# Patient Record
Sex: Male | Born: 1960 | Race: White | Hispanic: No | Smoking: Never smoker
Health system: Southern US, Community
[De-identification: ages and names within clinical notes are randomized; demographics above are authoritative.]

## PROBLEM LIST (undated history)

## (undated) DIAGNOSIS — I2510 Atherosclerotic heart disease of native coronary artery without angina pectoris: Secondary | ICD-10-CM

## (undated) DIAGNOSIS — L0390 Cellulitis, unspecified: Secondary | ICD-10-CM

## (undated) DIAGNOSIS — I82409 Acute embolism and thrombosis of unspecified deep veins of unspecified lower extremity: Secondary | ICD-10-CM

## (undated) DIAGNOSIS — I2089 Other forms of angina pectoris: Secondary | ICD-10-CM

## (undated) DIAGNOSIS — I219 Acute myocardial infarction, unspecified: Secondary | ICD-10-CM

## (undated) DIAGNOSIS — I208 Other forms of angina pectoris: Secondary | ICD-10-CM

## (undated) DIAGNOSIS — I10 Essential (primary) hypertension: Secondary | ICD-10-CM

## (undated) HISTORY — PX: CORONARY ANGIOPLASTY: SHX604

## (undated) HISTORY — PX: BACK SURGERY: SHX140

## (undated) HISTORY — DX: Essential (primary) hypertension: I10

## (undated) HISTORY — PX: HERNIA REPAIR: SHX51

---

## 2007-01-16 ENCOUNTER — Ambulatory Visit: Payer: Self-pay | Admitting: Nurse Practitioner

## 2007-01-17 ENCOUNTER — Ambulatory Visit: Payer: Self-pay | Admitting: Internal Medicine

## 2007-01-18 ENCOUNTER — Ambulatory Visit (HOSPITAL_COMMUNITY): Admission: RE | Admit: 2007-01-18 | Discharge: 2007-01-18 | Payer: Self-pay | Admitting: Family Medicine

## 2007-01-18 ENCOUNTER — Ambulatory Visit: Payer: Self-pay | Admitting: Vascular Surgery

## 2007-01-31 ENCOUNTER — Encounter (HOSPITAL_COMMUNITY): Admission: RE | Admit: 2007-01-31 | Discharge: 2007-02-01 | Payer: Self-pay | Admitting: Cardiology

## 2008-11-07 ENCOUNTER — Observation Stay (HOSPITAL_COMMUNITY): Admission: EM | Admit: 2008-11-07 | Discharge: 2008-11-09 | Payer: Self-pay | Admitting: Emergency Medicine

## 2008-11-28 ENCOUNTER — Ambulatory Visit: Payer: Self-pay | Admitting: Internal Medicine

## 2008-12-02 ENCOUNTER — Ambulatory Visit: Payer: Self-pay | Admitting: Internal Medicine

## 2008-12-09 ENCOUNTER — Ambulatory Visit (HOSPITAL_COMMUNITY): Admission: RE | Admit: 2008-12-09 | Discharge: 2008-12-09 | Payer: Self-pay | Admitting: Internal Medicine

## 2009-01-10 ENCOUNTER — Ambulatory Visit: Payer: Self-pay | Admitting: Internal Medicine

## 2009-04-09 ENCOUNTER — Encounter (INDEPENDENT_AMBULATORY_CARE_PROVIDER_SITE_OTHER): Payer: Self-pay | Admitting: Internal Medicine

## 2009-04-09 ENCOUNTER — Ambulatory Visit: Payer: Self-pay | Admitting: Internal Medicine

## 2009-04-09 LAB — CONVERTED CEMR LAB
BUN: 14 mg/dL
Basophils Absolute: 0 K/uL
Basophils Relative: 0 %
CO2: 21 meq/L
Calcium: 9.3 mg/dL
Chloride: 104 meq/L
Creatinine, Ser: 0.93 mg/dL
Eosinophils Absolute: 0.2 K/uL
Eosinophils Relative: 2 %
Glucose, Bld: 111 mg/dL — ABNORMAL HIGH
HCT: 47.1 %
Hemoglobin: 15.3 g/dL
Lymphocytes Relative: 20 %
Lymphs Abs: 2 K/uL
MCHC: 32.5 g/dL
MCV: 90.9 fL
Monocytes Absolute: 0.7 K/uL
Monocytes Relative: 7 %
Neutro Abs: 7.4 K/uL
Neutrophils Relative %: 72 %
Platelets: 255 K/uL
Potassium: 4.7 meq/L
Prolactin: 6.5 ng/mL
RBC: 5.18 M/uL
RDW: 13.8 %
Sodium: 139 meq/L
TSH: 2.013 u[IU]/mL
Testosterone: 168.95 ng/dL — ABNORMAL LOW
WBC: 10.3 10*3/microliter

## 2009-04-23 ENCOUNTER — Ambulatory Visit: Payer: Self-pay | Admitting: Internal Medicine

## 2009-04-23 ENCOUNTER — Encounter (INDEPENDENT_AMBULATORY_CARE_PROVIDER_SITE_OTHER): Payer: Self-pay | Admitting: Internal Medicine

## 2009-05-13 ENCOUNTER — Ambulatory Visit: Payer: Self-pay | Admitting: Internal Medicine

## 2010-11-04 LAB — CBC
Hemoglobin: 15.6 g/dL (ref 13.0–17.0)
MCHC: 34.1 g/dL (ref 30.0–36.0)
MCV: 88.8 fL (ref 78.0–100.0)
RBC: 5.16 MIL/uL (ref 4.22–5.81)

## 2010-11-04 LAB — LIPID PANEL
Cholesterol: 105 mg/dL (ref 0–200)
LDL Cholesterol: 59 mg/dL (ref 0–99)
Total CHOL/HDL Ratio: 4.2 RATIO

## 2010-11-04 LAB — URINALYSIS, ROUTINE W REFLEX MICROSCOPIC
Bilirubin Urine: NEGATIVE
Hgb urine dipstick: NEGATIVE
Specific Gravity, Urine: 1.017 (ref 1.005–1.030)
Urobilinogen, UA: 0.2 mg/dL (ref 0.0–1.0)
pH: 6.5 (ref 5.0–8.0)

## 2010-11-04 LAB — COMPREHENSIVE METABOLIC PANEL
ALT: 47 U/L (ref 0–53)
Alkaline Phosphatase: 52 U/L (ref 39–117)
CO2: 26 mEq/L (ref 19–32)
GFR calc non Af Amer: >60 mL/min (ref >60)
Glucose, Bld: 105 mg/dL — ABNORMAL HIGH (ref 70–99)
Potassium: 4.2 mEq/L (ref 3.5–5.1)
Sodium: 137 mEq/L (ref 135–145)

## 2010-11-04 LAB — HEMOGLOBIN A1C: Hgb A1c MFr Bld: 5.4 % (ref 4.6–6.1)

## 2010-11-04 LAB — TROPONIN I
Troponin I: <0.01 ng/mL (ref 0.00–0.06)
Troponin I: <0.01 ng/mL (ref 0.00–0.06)

## 2010-11-04 LAB — CK TOTAL AND CKMB (NOT AT ARMC): Total CK: 170 U/L (ref 7–232)

## 2010-11-04 LAB — DIFFERENTIAL
Basophils Relative: 0 % (ref 0–1)
Eosinophils Absolute: 0 10*3/uL (ref 0.0–0.7)
Monocytes Relative: 8 % (ref 3–12)
Neutrophils Relative %: 72 % (ref 43–77)

## 2010-11-04 LAB — PROTIME-INR: INR: 1 (ref 0.00–1.49)

## 2010-11-04 LAB — BRAIN NATRIURETIC PEPTIDE: Pro B Natriuretic peptide (BNP): 31 pg/mL (ref 0.0–100.0)

## 2010-12-08 NOTE — Discharge Summary (Signed)
NAMEPHILIP, Pena                ACCOUNT NO.:  [id number]   MEDICAL RECORD NO.:  [id number]          PATIENT TYPE:  OBV   LOCATION:  3707                         FACILITY:  [facility]   PHYSICIAN:  Antonieta Iba, MD   DATE OF BIRTH:  07/28/1960   DATE OF ADMISSION:  11/07/2008  DATE OF DISCHARGE:                               DISCHARGE SUMMARY   DISCHARGE DIAGNOSES:  1. Dyspnea on exertion, rule out anginal equivalent.  2. Neck pain, rule out anginal equivalent.  3. History of out-of-hospital arrest in [place] with cardiac      catheterization in the past but no knowledge of any stents and now      with nonobstructive coronary disease of 30% stenosis only in the      left anterior descending x2 areas, otherwise patent course and      normal ejection fraction.  4. Hypertension, poorly controlled.  5. Palpitations complained on admission.  6. Obesity.   DISCHARGE CONDITION:  Improved.   PROCEDURE:  Combined left heart cath by Dr. Ritta Slot on November 08, 2008.   DISCHARGE MEDICATIONS:  1. Metoprolol tartrate increased to 25 mg twice a day.  2. We discontinued his nitroglycerin as he had no spasm on cardiac      cath and nonobstructive coronary disease.  3. Vitamin C 1000 mg daily.  4. Folic acid 400 mg daily.  5. B12 1000 mg daily.  6. Aspirin 81 mg daily.  7. Glucosamine 2 daily.  8. ProAir HFA inhalation as needed.  9. We recommended fish oil 1 capsule twice a day to improve his HDL as      it was low.   DISCHARGE INSTRUCTIONS:  1. Low-sodium, heart-healthy diet.  2. Increase activity slowly.  May shower on November 09, 2008.  No      lifting for 2 days.  No driving for 2 days.  Wash cath site with      soap and water.  Call if any bleeding, swelling, or drainage.  3. Follow up with [facility] next week if symptoms continue.   HISTORY OF PRESENT ILLNESS:  The patient presented to the emergency  room, 50 year old male, unemployed, International aid/development worker, with  medical  care now at [facility], came to the emergency room with dyspnea on  exertion for 3-4 weeks.  The night prior to admission he noted  palpitations.  He took aspirin and nitroglycerin, and the morning of  November 07, 2008, symptoms returned, so he presented to the emergency  room.   Past medical history does include out-of-a-hospital rest by his history  in [place], unsure of what this was precipitated by.  It was hot  outside, he was in the yard.  He did have a cardiac cath in 2004,  restudy 11 months later.  No history of angioplasty or stents, had chest  pain in June 2008, seen in the emergency room and underwent Myoview  which was normal.  He also has a history of hypertension.   OUTPATIENT MEDICATIONS:  1. Metoprolol 25 b.i.d., he states he is only taking it once a day  to      the nurses.  2. Multivitamin daily.  3. Aspirin 81.   ALLERGY:  PENICILLIN which seemed to cause anaphylaxis and DEMEROL.   SOCIAL HISTORY:  Married, 4 grandchildren, never smoked.   Family history is positive for diabetes and hypertension.   REVIEW OF SYSTEMS:  See H and P.   PHYSICAL EXAMINATION ON DISCHARGE:  Blood pressure 139/91, resp is 18  with room air oxygen saturation 99%, pulse is 66, temp 98.6.  On exam,  lungs are clear.  Heart regular rate and rhythm.   LABORATORY DATA:  CBC on admission hemoglobin 15.6, hematocrit 45.8, WBC  9.9, platelets 24, neutrophils 72, lymph 20.  Comprehensive metabolic  panel, sodium 137, potassium 4.2, chloride 104, CO2 26, glucose 105, BUN  11, creatinine 0.99.  Total bili 0.9, alkaline phos 52, SGOT 30, SGPT  47, calcium 8.9, and that remained stable.  Cardiac markers, CK 113, MB  1.9, and troponin I less than 0.01.   Followup CK 114, MB 1.9, and troponin less than 0.01.  Third CK was  again negative, 170, with MB of 1.7 and troponin I of less than 0.01.   TSH was 1.993.  Lipid panel, total cholesterol 105, triglycerides 104,  HDL 25, and LDL 59.   UA was clear.  Protime 13.4 with an INR of 1.0.   Glycohemoglobin was 5.4 and BNP was 31.  Chest x-ray, two view, no  active cardiopulmonary disease.  Lungs were clear.  Heart, mediastinal  contours were within normal limits.   EKGs, sinus rhythm but no acute changes.   HOSPITAL COURSE:  The patient was admitted to [facility] after presenting with  increasing dyspnea on exertion.  He does have strong family history of  coronary disease.  He has had cardiac cath in the past and an out-of-  hospital arrest in the past.  He has undergone a lot of stress at home  recently.  He was admitted, placed on Lovenox, then scheduled for  cardiac catheterization.  The patient underwent cardiac cath with  nonobstructive coronary disease of 30% in the  LAD only and normal EF of 65%.  Dr. Lynnea Ferrier saw him after the cath and  felt he was ready for discharge.  He will follow up with [facility].  If he has further issues, he will contact either [facility].  If he has  problem with his cath site or groin, he will contact our office.      Darcella Gasman. Annie Paras, N.P.      Antonieta Iba, MD  Electronically Signed    LRI/MEDQ  D:  11/08/2008  T:  11/09/2008  Job:  [id number]   cc:   [facility].  Ritta Slot, MD

## 2012-02-10 ENCOUNTER — Encounter (HOSPITAL_COMMUNITY): Payer: Self-pay | Admitting: Emergency Medicine

## 2012-02-10 ENCOUNTER — Emergency Department (HOSPITAL_COMMUNITY)
Admission: EM | Admit: 2012-02-10 | Discharge: 2012-02-10 | Disposition: A | Discharge status: Home / Self Care | Payer: Worker's Compensation | Attending: Emergency Medicine | Admitting: Emergency Medicine

## 2012-02-10 ENCOUNTER — Emergency Department (HOSPITAL_COMMUNITY): Payer: Worker's Compensation

## 2012-02-10 DIAGNOSIS — Z79899 Other long term (current) drug therapy: Secondary | ICD-10-CM | POA: Insufficient documentation

## 2012-02-10 DIAGNOSIS — R110 Nausea: Secondary | ICD-10-CM

## 2012-02-10 DIAGNOSIS — R109 Unspecified abdominal pain: Secondary | ICD-10-CM | POA: Insufficient documentation

## 2012-02-10 HISTORY — DX: Atherosclerotic heart disease of native coronary artery without angina pectoris: I25.10

## 2012-02-10 LAB — CBC WITH DIFFERENTIAL/PLATELET
Basophils Absolute: 0 10*3/uL (ref 0.0–0.1)
HCT: 45.2 % (ref 39.0–52.0)
Lymphocytes Relative: 21 % (ref 12–46)
Monocytes Relative: 7 % (ref 3–12)
Neutro Abs: 7.3 10*3/uL (ref 1.7–7.7)
Neutrophils Relative %: 71 % (ref 43–77)
RDW: 12.8 % (ref 11.5–15.5)
WBC: 10.3 10*3/uL (ref 4.0–10.5)

## 2012-02-10 LAB — BASIC METABOLIC PANEL
GFR calc Af Amer: >90 mL/min (ref >90)
GFR calc non Af Amer: >90 mL/min (ref >90)
Glucose, Bld: 87 mg/dL (ref 70–99)
Potassium: 4.1 mEq/L (ref 3.5–5.1)
Sodium: 136 mEq/L (ref 135–145)

## 2012-02-10 LAB — URINALYSIS, ROUTINE W REFLEX MICROSCOPIC
Bilirubin Urine: NEGATIVE
Glucose, UA: NEGATIVE mg/dL
Hgb urine dipstick: NEGATIVE
Ketones, ur: NEGATIVE mg/dL
Leukocytes, UA: NEGATIVE
Nitrite: NEGATIVE
Protein, ur: NEGATIVE mg/dL
Specific Gravity, Urine: 1.028 (ref 1.005–1.030)
Urobilinogen, UA: 0.2 mg/dL (ref 0.0–1.0)
pH: 5 (ref 5.0–8.0)

## 2012-02-10 LAB — LACTIC ACID, PLASMA: Lactic Acid, Venous: 0.8 mmol/L (ref 0.5–2.2)

## 2012-02-10 MED ORDER — IOHEXOL 300 MG/ML  SOLN
100.0000 mL | Freq: Once | INTRAMUSCULAR | Status: AC | PRN
  Administered 2012-02-10: 100 mL via INTRAVENOUS

## 2012-02-10 MED ORDER — IOHEXOL 300 MG/ML  SOLN
20.0000 mL | INTRAMUSCULAR | Status: AC
  Administered 2012-02-10 (×2): 20 mL via ORAL

## 2012-02-10 MED ORDER — ONDANSETRON HCL 4 MG/2ML IJ SOLN
4.0000 mg | Freq: Once | INTRAMUSCULAR | Status: AC
  Administered 2012-02-10: 4 mg via INTRAVENOUS
  Filled 2012-02-10: qty 2

## 2012-02-10 MED ORDER — MORPHINE SULFATE 4 MG/ML IJ SOLN
4.0000 mg | Freq: Once | INTRAMUSCULAR | Status: AC
  Administered 2012-02-10: 4 mg via INTRAVENOUS
  Filled 2012-02-10: qty 1

## 2012-02-10 MED ORDER — ONDANSETRON HCL 4 MG PO TABS: 4.0000 mg | ORAL_TABLET | Freq: Four times a day (QID) | ORAL | Status: AC | PRN

## 2012-02-10 MED ORDER — HYDROCODONE-ACETAMINOPHEN 5-325 MG PO TABS: 1.0000 | ORAL_TABLET | ORAL | Status: AC | PRN

## 2012-02-10 NOTE — ED Notes (Signed)
Report given to [facility].  Pt being transported to CDU 7.

## 2012-02-10 NOTE — ED Notes (Signed)
Pt c/o mid abd pain x several days; seen at PCP and told have hernia; pt sts increased pain and went back pt PCP told that hernia was much larger and to come here

## 2012-02-10 NOTE — ED Notes (Signed)
Pt returned from CT °

## 2012-02-10 NOTE — ED Notes (Signed)
Patient transported to CT 

## 2012-02-10 NOTE — ED Notes (Signed)
Pt requesting pain meds and MD.  Apologized for delays.

## 2012-02-10 NOTE — ED Notes (Signed)
Complaining of burning pain on side of hernia. States started on the 8th.  Seen at [facility] a week ago and again today. States hernia has enlarged since. Rates pain as 2/10.

## 2012-02-10 NOTE — ED Provider Notes (Signed)
History     CSN: [id number]  Arrival date & time 02/10/12  1352   First MD Initiated Contact with Patient 02/10/12 1846      Chief Complaint  Patient presents with  . Abdominal Pain  . Hernia    (Consider location/radiation/quality/duration/timing/severity/associated sxs/prior treatment) Patient is a 51 y.o. male presenting with abdominal pain. The history is provided by the patient.  Abdominal Pain The primary symptoms of the illness include abdominal pain and nausea. The primary symptoms of the illness do not include fever, fatigue, shortness of breath, vomiting, diarrhea or dysuria. The current episode started more than 2 days ago. The onset of the illness was gradual. The problem has been gradually worsening.  Pain Location: L side. The abdominal pain does not radiate. The severity of the abdominal pain is 5/10. The abdominal pain is relieved by nothing.  The patient has not had a change in bowel habit. Symptoms associated with the illness do not include chills, anorexia, constipation, urgency, hematuria or back pain.    Past Medical History  Diagnosis Date  . Coronary artery disease     No past surgical history on file.  No family history on file.  History  Substance Use Topics  . Smoking status: Never Smoker   . Smokeless tobacco: Not on file  . Alcohol Use: Yes     occasional      Review of Systems  Unable to perform ROS Constitutional: Negative for fever, chills and fatigue.  Respiratory: Negative for cough and shortness of breath.   Cardiovascular: Negative for chest pain and palpitations.  Gastrointestinal: Positive for nausea and abdominal pain. Negative for vomiting, diarrhea, constipation, blood in stool, abdominal distention and anorexia.  Genitourinary: Negative for dysuria, urgency and hematuria.  Musculoskeletal: Negative for myalgias, back pain and arthralgias.  Skin: Negative for color change, rash and wound.  Neurological: Negative for  light-headedness and headaches.  All other systems reviewed and are negative.    Allergies  Penicillins and Demerol  Home Medications   Current Outpatient Rx  Name Route Sig Dispense Refill  . NAPROXEN SODIUM 220 MG PO TABS Oral Take 660 mg by mouth 3 (three) times daily as needed. For pain    . HYDROCODONE-ACETAMINOPHEN 5-325 MG PO TABS Oral Take 1 tablet by mouth every 4 (four) hours as needed for pain. 6 tablet 0  . ONDANSETRON HCL 4 MG PO TABS Oral Take 1 tablet (4 mg total) by mouth every 6 (six) hours as needed for nausea. 12 tablet 0    BP 113/81  Pulse 57  Temp 98.5 F (36.9 C) (Oral)  Resp 18  SpO2 96%  Physical Exam  Nursing note and vitals reviewed. Constitutional: He is oriented to person, place, and time. He appears well-developed and well-nourished.  HENT:  Head: Normocephalic and atraumatic.  Eyes: EOM are normal. Pupils are equal, round, and reactive to light.  Cardiovascular: Normal rate and regular rhythm.   Pulmonary/Chest: Effort normal and breath sounds normal. No respiratory distress.  Abdominal: Soft. Bowel sounds are normal. He exhibits no distension. There is tenderness (moderate, L side, LLQ). There is no rebound and no guarding. A hernia is present. Hernia confirmed positive in the ventral area (small, non tender, non ecchymotic or discolored; reducible but comes back out).  Neurological: He is alert and oriented to person, place, and time.  Skin: Skin is warm and dry.  Psychiatric: He has a normal mood and affect.    ED Course  Procedures (including critical  care time)   Labs Reviewed  CBC WITH DIFFERENTIAL  BASIC METABOLIC PANEL  URINALYSIS, ROUTINE W REFLEX MICROSCOPIC  LACTIC ACID, PLASMA   Ct Abdomen Pelvis W Contrast  02/10/2012  *RADIOLOGY REPORT*  Clinical Data: Abdominal pain  CT ABDOMEN AND PELVIS WITH CONTRAST  Technique:  Multidetector CT imaging of the abdomen and pelvis was performed following the standard protocol during bolus  administration of intravenous contrast.  Contrast:  100 ml Omnipaque 300 IV contrast  Comparison: None.  Findings: Trace dependent left pleural fluid or thickening.  Lung bases are otherwise clear.  Hepatic steatosis noted.  Posterior segment right hepatic lobe cyst measures 1.3 cm. Cholecystectomy clips noted. Right kidney, right adrenal gland, and pancreas are normal.  2.1 cm left adrenal mass measures 8 HU on delayed postcontrast images, compatible with a benign adenoma. 6 mm too small to characterize splenic hypodensity image 28, statistically most likely lymphangioma or hemangioma. Left lower renal pole 6 mm renal cortical cyst is noted.  3 mm too small to characterize left lower renal pole cortical hypodensity image 44.  No lymphadenopathy or ascites.  No bowel wall thickening or focal segmental dilatation.  The appendix is not visualized but there is no secondary evidence for acute appendicitis.  Two adjacent narrow-necked fat-containing umbilical hernias are noted without internal fluid or stranding.  These are best seen on sagittal image 84. Lumbar spine degenerative change noted.  This is most prominent at L5-S1.  No acute osseous abnormality.  IMPRESSION: No acute intra-abdominal or pelvic pathology.  Fat containing umbilical hernias as described above.  Left adrenal adenoma.  Hepatic steatosis.  Original Report Authenticated By: Harrel Lemon, M.D.     1. Abdominal pain of unknown etiology   2. Nausea       MDM  A 51 year old male presents with one week's worth of left sided abdominal pain. He was seen by his doctor about one week ago, and was noted at that time to have a ventral hernia, but his exam at that time was relatively benign. The patient has had worsening pain over the past couple of days accompanied by nausea, so returned to his PCP today where his hernia was noted to be larger in size so he was sent to the ED for further evaluation. The patient denies any fevers, any actual  episodes of vomiting, any changes in his bowels or his urine. He does note a remote history of kidney stones, but states that his pain was in his back, with intermittent and felt different from his current pain, which he also notes is continuous. On exam the patient is noted to have a small ventral hernia that is non-tender and is not incarcerated, it is easily reducible but does come back out when pressure is not applied. The patient does have moderate left-sided tenderness of his abdomen without any rebound or guarding, exam is otherwise unremarkable. His lab work is unremarkable. We'll get a CT scan to assess her possible intra-abdominal pathology contributing to his pain, however do not think a hernia is etiology of his pain.  CT scan as above. No acute findings. Discussed the patient treatment at home, close followup with his regular doctor and possible need for further workup as an outpatient if his pain persists, possibly including GI referral. The patient expresses understanding of this plan.        Theotis Burrow, MD 02/10/12 [phone]

## 2012-02-10 NOTE — ED Notes (Signed)
Pt changing into gown and providing urine sample.  Apologized for delays.

## 2012-02-14 NOTE — ED Provider Notes (Signed)
I saw and evaluated the patient, reviewed the resident's note and I agree with the findings and plan.   .Face to face Exam:  General:  Awake HEENT:  Atraumatic Resp:  Normal effort Abd:  Nondistended Neuro:No focal weakness Lymph: No adenopathy   Nelia Shi, MD 02/14/12 0403

## 2012-07-18 ENCOUNTER — Encounter (HOSPITAL_COMMUNITY): Payer: Self-pay | Admitting: Emergency Medicine

## 2012-07-18 ENCOUNTER — Emergency Department (HOSPITAL_COMMUNITY)
Admission: EM | Admit: 2012-07-18 | Discharge: 2012-07-18 | Disposition: A | Discharge status: Home / Self Care | Payer: Self-pay | Attending: Emergency Medicine | Admitting: Emergency Medicine

## 2012-07-18 DIAGNOSIS — Y929 Unspecified place or not applicable: Secondary | ICD-10-CM | POA: Insufficient documentation

## 2012-07-18 DIAGNOSIS — I2510 Atherosclerotic heart disease of native coronary artery without angina pectoris: Secondary | ICD-10-CM | POA: Insufficient documentation

## 2012-07-18 DIAGNOSIS — Y9389 Activity, other specified: Secondary | ICD-10-CM | POA: Insufficient documentation

## 2012-07-18 DIAGNOSIS — S3992XA Unspecified injury of lower back, initial encounter: Secondary | ICD-10-CM

## 2012-07-18 DIAGNOSIS — X500XXA Overexertion from strenuous movement or load, initial encounter: Secondary | ICD-10-CM | POA: Insufficient documentation

## 2012-07-18 DIAGNOSIS — IMO0002 Reserved for concepts with insufficient information to code with codable children: Secondary | ICD-10-CM | POA: Insufficient documentation

## 2012-07-18 MED ORDER — CYCLOBENZAPRINE HCL 10 MG PO TABS: 10.0000 mg | ORAL_TABLET | Freq: Two times a day (BID) | ORAL | Status: DC | PRN

## 2012-07-18 MED ORDER — KETOROLAC TROMETHAMINE 60 MG/2ML IM SOLN
60.0000 mg | Freq: Once | INTRAMUSCULAR | Status: AC
  Administered 2012-07-18: 60 mg via INTRAMUSCULAR
  Filled 2012-07-18: qty 2

## 2012-07-18 MED ORDER — DIAZEPAM 5 MG PO TABS
5.0000 mg | ORAL_TABLET | Freq: Once | ORAL | Status: AC
  Administered 2012-07-18: 5 mg via ORAL
  Filled 2012-07-18: qty 1

## 2012-07-18 NOTE — ED Notes (Signed)
Pt c/o lower back pain with radiation into left hip after trying to catch 51 year old; pt sts hx of similar

## 2012-07-18 NOTE — ED Provider Notes (Signed)
History     CSN: [id number]  Arrival date & time 07/18/12  4098   First MD Initiated Contact with Patient 07/18/12 1006      Chief Complaint  Patient presents with  . Back Pain    (Consider location/radiation/quality/duration/timing/severity/associated sxs/prior treatment) HPI Comments: Patient is a 51 year old male who presents with left lower back pain for the past 3 days. Patient reports playing with his 21 year old son when he felt sudden onset of sharp back pain located in her left lower back. Pain does not radiate. Since the injury, patient reports a sharp, severe pain that is made worse with movement. Patient has tried advil for pain without relief. No alleviating factors. Patient denies saddle paresthesias, bladder/bowel incontinence, numbness/tingling, weakness.    Past Medical History  Diagnosis Date  . Coronary artery disease     History reviewed. No pertinent past surgical history.  History reviewed. No pertinent family history.  History  Substance Use Topics  . Smoking status: Never Smoker   . Smokeless tobacco: Not on file  . Alcohol Use: Yes     Comment: occasional      Review of Systems  Musculoskeletal: Positive for back pain.  All other systems reviewed and are negative.    Allergies  Penicillins and Demerol  Home Medications   Current Outpatient Rx  Name  Route  Sig  Dispense  Refill  . NAPROXEN SODIUM 220 MG PO TABS   Oral   Take 660 mg by mouth 3 (three) times daily as needed. For pain           BP 144/80  Pulse 75  Temp 98 F (36.7 C) (Oral)  Resp 22  SpO2 97%  Physical Exam  Nursing note and vitals reviewed. Constitutional: He is oriented to person, place, and time. He appears well-developed and well-nourished. No distress.  HENT:  Head: Normocephalic and atraumatic.  Eyes: Conjunctivae normal and EOM are normal.  Neck: Normal range of motion. Neck supple.  Cardiovascular: Normal rate, regular rhythm and intact distal  pulses.  Exam reveals no gallop and no friction rub.   No murmur heard. Pulmonary/Chest: Effort normal and breath sounds normal. He has no wheezes. He has no rales. He exhibits no tenderness.  Abdominal: Soft. There is no tenderness.  Musculoskeletal: Normal range of motion.       Tenderness to left lumbar paraspinal area.   Neurological: He is alert and oriented to person, place, and time. Coordination normal.       Strength and sensation equal and intact bilaterally. Speech is goal-oriented. Moves limbs without ataxia.   Skin: Skin is warm and dry. He is not diaphoretic.  Psychiatric: He has a normal mood and affect.    ED Course  Procedures (including critical care time)  Labs Reviewed - No data to display No results found.   1. Injury of back       MDM  10:07 AM Patient will have toradol IM and valium PO here. I will discharge him with Flexeril. No bladder/bowel incontinence, weakness/numbness.         Emilia Beck, PA-C 07/18/12 1235

## 2012-07-19 NOTE — ED Provider Notes (Signed)
Medical screening examination/treatment/procedure(s) were performed by non-physician practitioner and as supervising physician I was immediately available for consultation/collaboration.   Cherina Dhillon W. Davidlee Jeanbaptiste, MD 07/19/12 1653 

## 2012-07-21 ENCOUNTER — Encounter (HOSPITAL_COMMUNITY): Payer: Self-pay | Admitting: Emergency Medicine

## 2012-07-21 ENCOUNTER — Emergency Department (HOSPITAL_COMMUNITY): Payer: Self-pay

## 2012-07-21 ENCOUNTER — Emergency Department (HOSPITAL_COMMUNITY)
Admission: EM | Admit: 2012-07-21 | Discharge: 2012-07-21 | Disposition: A | Discharge status: Home / Self Care | Payer: Self-pay | Attending: Emergency Medicine | Admitting: Emergency Medicine

## 2012-07-21 DIAGNOSIS — S39012A Strain of muscle, fascia and tendon of lower back, initial encounter: Secondary | ICD-10-CM

## 2012-07-21 DIAGNOSIS — X58XXXA Exposure to other specified factors, initial encounter: Secondary | ICD-10-CM | POA: Insufficient documentation

## 2012-07-21 DIAGNOSIS — Y929 Unspecified place or not applicable: Secondary | ICD-10-CM | POA: Insufficient documentation

## 2012-07-21 DIAGNOSIS — Y939 Activity, unspecified: Secondary | ICD-10-CM | POA: Insufficient documentation

## 2012-07-21 DIAGNOSIS — I2510 Atherosclerotic heart disease of native coronary artery without angina pectoris: Secondary | ICD-10-CM | POA: Insufficient documentation

## 2012-07-21 DIAGNOSIS — S339XXA Sprain of unspecified parts of lumbar spine and pelvis, initial encounter: Secondary | ICD-10-CM | POA: Insufficient documentation

## 2012-07-21 DIAGNOSIS — Z79899 Other long term (current) drug therapy: Secondary | ICD-10-CM | POA: Insufficient documentation

## 2012-07-21 MED ORDER — HYDROCODONE-ACETAMINOPHEN 5-325 MG PO TABS: 1.0000 | ORAL_TABLET | ORAL | Status: DC | PRN

## 2012-07-21 MED ORDER — KETOROLAC TROMETHAMINE 10 MG PO TABS: 10.0000 mg | ORAL_TABLET | Freq: Four times a day (QID) | ORAL | Status: DC | PRN

## 2012-07-21 MED ORDER — METHOCARBAMOL 500 MG PO TABS
1000.0000 mg | ORAL_TABLET | Freq: Once | ORAL | Status: AC
  Administered 2012-07-21: 1000 mg via ORAL
  Filled 2012-07-21: qty 2

## 2012-07-21 MED ORDER — KETOROLAC TROMETHAMINE 60 MG/2ML IM SOLN
60.0000 mg | Freq: Once | INTRAMUSCULAR | Status: AC
  Administered 2012-07-21: 60 mg via INTRAMUSCULAR
  Filled 2012-07-21: qty 2

## 2012-07-21 NOTE — ED Notes (Signed)
Pt was in ED three days ago for lower back pain and received a shot in the hip that he stated helped for a couple hours where he was able to walk straight. He would like another shot with a higher dose. A&Ox4, ambulatory using furniture to hold, nad.

## 2012-07-21 NOTE — ED Notes (Signed)
Pt returned from xray

## 2012-07-21 NOTE — ED Notes (Signed)
Pt c/o lower back pain x several days; pt seen 12/24 for same and given meds with some relief

## 2012-07-21 NOTE — ED Provider Notes (Signed)
History  This chart was scribed for Loren Racer, MD by Bennett Scrape, ED Scribe. This patient was seen in room TR06C/TR06C and the patient's care was started at 11:04 AM.   CSN: [id number]  Arrival date & time 07/21/12  4098   First MD Initiated Contact with Patient 07/21/12 1104      Chief Complaint  Patient presents with  . Back Pain     Patient is a 51 y.o. male presenting with back pain. The history is provided by the patient. No language interpreter was used.  Back Pain  This is a new problem. The current episode started more than 2 days ago. The problem occurs constantly. The problem has not changed since onset.The pain is associated with no known injury. The pain is present in the lumbar spine. The pain does not radiate. The symptoms are aggravated by bending, twisting and certain positions. Pertinent negatives include no fever, no numbness, no bowel incontinence, no bladder incontinence and no weakness. He has tried muscle relaxants for the symptoms. The treatment provided mild relief.    Clayton Pena is a 51 y.o. male who presents to the Emergency Department complaining of 6 days of non-changing, non-radiating lower back pain described as sharp that started while playing with his grandson. He was seen in the ED for the same 3 days ago and prescribed Flexeril with relief. He has been taking Aleve and ASA at home with no improvement. He denies having bowel or urinary incontinence as associated symptoms. He has a h/o CAD and is an occasional alcohol user but denies smoking.  Past Medical History  Diagnosis Date  . Coronary artery disease     History reviewed. No pertinent past surgical history.  History reviewed. No pertinent family history.  History  Substance Use Topics  . Smoking status: Never Smoker   . Smokeless tobacco: Not on file  . Alcohol Use: Yes     Comment: occasional      Review of Systems  Constitutional: Negative for fever and chills.    Gastrointestinal: Negative for bowel incontinence.  Genitourinary: Negative for bladder incontinence.  Musculoskeletal: Positive for back pain.  Neurological: Negative for weakness and numbness.  All other systems reviewed and are negative.    Allergies  Penicillins and Demerol  Home Medications   Current Outpatient Rx  Name  Route  Sig  Dispense  Refill  . CYCLOBENZAPRINE HCL 10 MG PO TABS   Oral   Take 10 mg by mouth 2 (two) times daily as needed. For muscle spasms         . HYDROCODONE-ACETAMINOPHEN 5-500 MG PO TABS   Oral   Take 1 tablet by mouth every 6 (six) hours as needed. For pain         . HYDROCODONE-ACETAMINOPHEN 5-325 MG PO TABS   Oral   Take 1 tablet by mouth every 4 (four) hours as needed for pain.   15 tablet   0   . KETOROLAC TROMETHAMINE 10 MG PO TABS   Oral   Take 1 tablet (10 mg total) by mouth every 6 (six) hours as needed for pain.   20 tablet   0     Triage Vitals: BP 163/96  Pulse 82  Temp 98.2 F (36.8 C) (Oral)  Resp 18  SpO2 98%  Physical Exam  Nursing note and vitals reviewed. Constitutional: He is oriented to person, place, and time. He appears well-developed and well-nourished. No distress.  HENT:  Head: Normocephalic and atraumatic.  Eyes: EOM are normal.  Neck: Neck supple. No tracheal deviation present.  Cardiovascular: Normal rate.   Pulmonary/Chest: Effort normal. No respiratory distress.  Musculoskeletal: Normal range of motion. He exhibits tenderness.       Lower lumbar tenderness and left paraspinal tenderness  Neurological: He is alert and oriented to person, place, and time.       Normal motor strength, no sensory deficits   Skin: Skin is warm and dry.  Psychiatric: He has a normal mood and affect. His behavior is normal.    ED Course  Procedures (including critical care time)  DIAGNOSTIC STUDIES: Oxygen Saturation is 98% on room air, normal by my interpretation.    COORDINATION OF CARE: 11:14 AM-  Discussed treatment plan which includes xray with pt at bedside and pt agreed to plan.  11:30 AM- Ordered 60 mg Toradol injection and 1,000 mg Robaxin tablet.   12:59 PM- Pt rechecked and looks well-appearing, reports pain is improved with medications listed above. Advised pt of radiology results. Discussed discharge plan which includes Toradol and norco with pt and pt agreed to plan. Also advised pt to follow up with PCP.  1:00 PM-Prescribed 10 mg Toradol tablets and 5-325 mg Norco tablets.  Labs Reviewed - No data to display Dg Lumbar Spine Complete  07/21/2012  *RADIOLOGY REPORT*  Clinical Data: Low back pain.  LUMBAR SPINE - COMPLETE 4+ VIEW  Comparison: None.  Findings: Degenerative disc disease throughout the lumbar spine, most pronounced at L5-S1.  Degenerative facet disease in the lower lumbar spine.  Slight retrolisthesis of L5 on S1 of approximately 5 mm.  No fracture.  SI joints are symmetric and unremarkable.  IMPRESSION: Degenerative changes as above.  No acute findings.   Original Report Authenticated By: Charlett Nose, M.D.      1. Lumbosacral strain       MDM  I personally performed the services described in this documentation, which was scribed in my presence. The recorded information has been reviewed and is accurate.    Loren Racer, MD 07/21/12 [phone]

## 2012-12-24 ENCOUNTER — Other Ambulatory Visit: Payer: Self-pay

## 2012-12-24 ENCOUNTER — Encounter (HOSPITAL_COMMUNITY): Payer: Self-pay | Admitting: Emergency Medicine

## 2012-12-24 ENCOUNTER — Emergency Department (HOSPITAL_COMMUNITY)
Admission: EM | Admit: 2012-12-24 | Discharge: 2012-12-24 | Disposition: A | Discharge status: Home / Self Care | Payer: Self-pay | Attending: Emergency Medicine | Admitting: Emergency Medicine

## 2012-12-24 ENCOUNTER — Emergency Department (HOSPITAL_COMMUNITY): Payer: Self-pay

## 2012-12-24 DIAGNOSIS — J069 Acute upper respiratory infection, unspecified: Secondary | ICD-10-CM | POA: Insufficient documentation

## 2012-12-24 DIAGNOSIS — R059 Cough, unspecified: Secondary | ICD-10-CM | POA: Insufficient documentation

## 2012-12-24 DIAGNOSIS — R05 Cough: Secondary | ICD-10-CM

## 2012-12-24 DIAGNOSIS — R509 Fever, unspecified: Secondary | ICD-10-CM | POA: Insufficient documentation

## 2012-12-24 DIAGNOSIS — R209 Unspecified disturbances of skin sensation: Secondary | ICD-10-CM | POA: Insufficient documentation

## 2012-12-24 DIAGNOSIS — E669 Obesity, unspecified: Secondary | ICD-10-CM | POA: Insufficient documentation

## 2012-12-24 DIAGNOSIS — I2510 Atherosclerotic heart disease of native coronary artery without angina pectoris: Secondary | ICD-10-CM | POA: Insufficient documentation

## 2012-12-24 LAB — COMPREHENSIVE METABOLIC PANEL
AST: 35 U/L (ref 0–37)
Albumin: 3.6 g/dL (ref 3.5–5.2)
Chloride: 105 mEq/L (ref 96–112)
Creatinine, Ser: 0.82 mg/dL (ref 0.50–1.35)
Potassium: 3.6 mEq/L (ref 3.5–5.1)
Total Bilirubin: 0.5 mg/dL (ref 0.3–1.2)

## 2012-12-24 LAB — POCT I-STAT TROPONIN I: Troponin i, poc: 0.01 ng/mL (ref 0.00–0.08)

## 2012-12-24 LAB — CBC
MCV: 85.9 fL (ref 78.0–100.0)
Platelets: 190 10*3/uL (ref 150–400)
RDW: 13.1 % (ref 11.5–15.5)
WBC: 7 10*3/uL (ref 4.0–10.5)

## 2012-12-24 MED ORDER — HYDROCODONE-ACETAMINOPHEN 7.5-325 MG/15ML PO SOLN: 15.0000 mL | Freq: Four times a day (QID) | ORAL | Status: DC | PRN

## 2012-12-24 MED ORDER — ASPIRIN 81 MG PO CHEW
324.0000 mg | CHEWABLE_TABLET | Freq: Once | ORAL | Status: AC
  Administered 2012-12-24: 324 mg via ORAL
  Filled 2012-12-24: qty 4

## 2012-12-24 MED ORDER — ALBUTEROL SULFATE HFA 108 (90 BASE) MCG/ACT IN AERS
2.0000 | INHALATION_SPRAY | Freq: Once | RESPIRATORY_TRACT | Status: AC
  Administered 2012-12-24: 2 via RESPIRATORY_TRACT
  Filled 2012-12-24: qty 6.7

## 2012-12-24 MED ORDER — NITROGLYCERIN 0.4 MG SL SUBL: 0.4000 mg | SUBLINGUAL_TABLET | SUBLINGUAL | Status: DC | PRN

## 2012-12-24 MED ORDER — HYDROCOD POLST-CHLORPHEN POLST 10-8 MG/5ML PO LQCR
5.0000 mL | Freq: Once | ORAL | Status: AC
  Administered 2012-12-24: 5 mL via ORAL
  Filled 2012-12-24: qty 5

## 2012-12-24 MED ORDER — ALBUTEROL SULFATE (5 MG/ML) 0.5% IN NEBU
5.0000 mg | INHALATION_SOLUTION | Freq: Once | RESPIRATORY_TRACT | Status: AC
  Administered 2012-12-24: 5 mg via RESPIRATORY_TRACT
  Filled 2012-12-24: qty 1

## 2012-12-24 NOTE — ED Notes (Signed)
Pt at xray

## 2012-12-24 NOTE — ED Provider Notes (Signed)
Medical screening examination/treatment/procedure(s) were conducted as a shared visit with non-physician practitioner(s) and myself.  I personally evaluated the patient during the encounter  Doug Sou, MD 12/24/12 1701

## 2012-12-24 NOTE — ED Notes (Signed)
Pt. Stated, i started about 3 days with a terrible cough, I've tried everything for the cough.  This morning  I started having tingling on my lt arm up into my neck and some chest pain and this was the same symptoms when i had my heart attack in 2005

## 2012-12-24 NOTE — ED Provider Notes (Signed)
History     CSN: [id number]  Arrival date & time 12/24/12  0803   First MD Initiated Contact with Patient 12/24/12 [id number]      Chief Complaint  Patient presents with  . Chest Pain    (Consider location/radiation/quality/duration/timing/severity/associated sxs/prior treatment) HPI  Clayton Pena is a 52 y.o. male c/o 10 seconds of posttussive substernal chest pain described as sharp with radiation of tingling to left arm and left neck. Pain started at 5 AM when he was awoken from sleep by coughing. Patient has had 4 days of intermittently productive cough associated with shortness of breath, subjective fever and chills. Denies SOB, N/V,  back pain, syncope, prior episodes, recent cocaine/methamphetimine use. Denies h/o DVT,  recent travel, leg swelling, hemoptysis.  Pt has not received any ASA or NTG in the last 24 hours. Normally takes full dose aspirin daily. Patient states that he's had several heart attacks: 3 caths no stents as per Pt.  He also states that he had a PE in 2011 he was seen [facility] in [place]. He state he was given shots for the 4 days when he was in the hospital but he has never been on any blood thinners outside of the hospital. Patient endorses rhinorrhea and states that the cough is worsened by lying supine. He states that he thought this was allergies he's been taking cough suppressants without relief so therefore can't be allergies.  RF: prior MI (2005 and 2006) PE in 2011  Cath: 3x - No stents Cardiologost: None currently, seen at [facility] [place]  [facility] [place] PCP: Patient states he goes to a clinic in town but he does not remember the name of the clinic or the doctor.  Past Medical History  Diagnosis Date  . Coronary artery disease     History reviewed. No pertinent past surgical history.  No family history on file.  History  Substance Use Topics  . Smoking status: Never Smoker   . Smokeless tobacco: Not on file  . Alcohol  Use: Yes     Comment: occasional      Review of Systems  Constitutional: Positive for fever and chills.  Respiratory: Negative for shortness of breath.   Cardiovascular: Positive for chest pain.  Gastrointestinal: Negative for nausea, vomiting, abdominal pain and diarrhea.  All other systems reviewed and are negative.    Allergies  Penicillins and Demerol  Home Medications   Current Outpatient Rx  Name  Route  Sig  Dispense  Refill  . cyclobenzaprine (FLEXERIL) 10 MG tablet   Oral   Take 10 mg by mouth 2 (two) times daily as needed. For muscle spasms         . HYDROcodone-acetaminophen (NORCO/VICODIN) 5-325 MG per tablet   Oral   Take 1 tablet by mouth every 4 (four) hours as needed for pain.   15 tablet   0   . HYDROcodone-acetaminophen (VICODIN) 5-500 MG per tablet   Oral   Take 1 tablet by mouth every 6 (six) hours as needed. For pain         . ketorolac (TORADOL) 10 MG tablet   Oral   Take 1 tablet (10 mg total) by mouth every 6 (six) hours as needed for pain.   20 tablet   0     BP 118/101  Pulse 83  Temp(Src) 98.4 F (36.9 C) (Oral)  Resp 20  SpO2 96%  Physical Exam  Nursing note and vitals reviewed. Constitutional:  He is oriented to person, place, and time. He appears well-developed and well-nourished. No distress.  Obese, sweating  HENT:  Head: Normocephalic.  Mouth/Throat: Oropharynx is clear and moist.  Eyes: Conjunctivae and EOM are normal. Pupils are equal, round, and reactive to light.  Neck: Normal range of motion. No JVD present.  Cardiovascular: Normal rate, regular rhythm, normal heart sounds and intact distal pulses.   Pulmonary/Chest: Effort normal and breath sounds normal. No stridor. No respiratory distress. He has no wheezes. He has no rales. He exhibits no tenderness.  Abdominal: Soft. There is no tenderness.  Musculoskeletal: Normal range of motion. He exhibits no edema.  Varicosities but No calf asymmetry, superficial  collaterals, palpable cords, edema, Homans sign negative bilaterally.    Neurological: He is alert and oriented to person, place, and time.  Psychiatric: He has a normal mood and affect.    ED Course  Procedures (including critical care time)  Labs Reviewed  COMPREHENSIVE METABOLIC PANEL - Abnormal; Notable for the following:    Glucose, Bld 100 (*)    All other components within normal limits  CBC  D-DIMER, QUANTITATIVE  POCT I-STAT TROPONIN I   Dg Chest 2 View  12/24/2012   *RADIOLOGY REPORT*  Clinical Data: Cough, congestion, left chest pain  CHEST - 2 VIEW  Comparison: 11/07/2008  Findings: Lungs are essentially clear.  No focal consolidation.  No pleural effusion or pneumothorax.  The heart is normal in size.  Mild degenerative changes of the visualized thoracolumbar spine.  IMPRESSION: No evidence of acute cardiopulmonary disease.   Original Report Authenticated By: Charline Bills, M.D.     Date: 12/24/2012  Rate: 85  Rhythm: normal sinus rhythm and premature atrial contractions (PAC)  QRS Axis: normal  Intervals: normal  ST/T Wave abnormalities: normal  Conduction Disutrbances:none  Narrative Interpretation:   Old EKG Reviewed: unchanged   1. Cough       MDM   Filed Vitals:   12/24/12 0807 12/24/12 0827 12/24/12 0829 12/24/12 0940  BP: 118/101  130/84   Pulse: 83  99   Temp: 98.4 F (36.9 C)  97.9 F (36.6 C)   TempSrc: Oral  Oral   Resp: 20  17   Height:  5\' 11"  (1.803 m)    Weight:  290 lb (131.543 kg)    SpO2: 96%  99% 98%     Clayton Pena is a 52 y.o. male with productive cough, subjective fever and chills and leading chest pain with paresthesia to the left arm and jaw. I doubt cardiac origin. EKG nonischemic, troponin is negative, chest x-ray shows no signs of infiltrate in the lung sounds are clear. D-dimer is negative. I otherwise unremarkable.  I think this is allergies with postnasal drip. I have advised him to take over-the-counter  decongestants. Will treat the patient with symptomatic relief with cough suppressant and advise Motrin for fever control.  This is a shared visit with the attending physician who personally evaluated the patient and agrees with the care plan.    Medications  nitroGLYCERIN (NITROSTAT) SL tablet 0.4 mg (not administered)  aspirin chewable tablet 324 mg (324 mg Oral Given 12/24/12 0825)  chlorpheniramine-HYDROcodone (TUSSIONEX) 10-8 MG/5ML suspension 5 mL (5 mLs Oral Given 12/24/12 0918)  albuterol (PROVENTIL) (5 MG/ML) 0.5% nebulizer solution 5 mg (5 mg Nebulization Given 12/24/12 0937)    The patient is hemodynamically stable, appropriate for, and amenable to, discharge at this time. Pt verbalized understanding and agrees with care plan. Outpatient follow-up and  return precautions given.    New Prescriptions   HYDROCODONE-ACETAMINOPHEN (HYCET) 7.5-325 MG/15 ML SOLUTION    Take 15 mLs by mouth every 6 (six) hours as needed for cough.           Wynetta Emery, PA-C 12/24/12 [address], PA-C 12/24/12 1006

## 2012-12-24 NOTE — ED Notes (Signed)
Resp called. Resp at bedside.

## 2012-12-24 NOTE — ED Provider Notes (Signed)
Complains of cough productive of yellow sputum with subjective fever for 2 weeks. No treatment prior to coming here patient also reports that he had pain at left chest radiating to left neck for 12 seconds this morning which resolved spontaneously. He is presently asymptomatic except for cough and mild shortness of breath. On exam no distress lungs clear auscultation heart regular rate and rhythm no murmurs  Doug Sou, MD 12/24/12 1701

## 2012-12-24 NOTE — ED Notes (Signed)
MD at bedside. 

## 2015-07-02 ENCOUNTER — Emergency Department (HOSPITAL_COMMUNITY)
Admission: EM | Admit: 2015-07-02 | Discharge: 2015-07-02 | Disposition: A | Discharge status: Home / Self Care | Payer: Worker's Compensation | Attending: Emergency Medicine | Admitting: Emergency Medicine

## 2015-07-02 ENCOUNTER — Encounter (HOSPITAL_COMMUNITY): Payer: Self-pay | Admitting: *Deleted

## 2015-07-02 ENCOUNTER — Emergency Department (HOSPITAL_COMMUNITY): Payer: Worker's Compensation

## 2015-07-02 DIAGNOSIS — S29001A Unspecified injury of muscle and tendon of front wall of thorax, initial encounter: Secondary | ICD-10-CM | POA: Insufficient documentation

## 2015-07-02 DIAGNOSIS — Y92410 Unspecified street and highway as the place of occurrence of the external cause: Secondary | ICD-10-CM | POA: Diagnosis not present

## 2015-07-02 DIAGNOSIS — I2510 Atherosclerotic heart disease of native coronary artery without angina pectoris: Secondary | ICD-10-CM | POA: Diagnosis not present

## 2015-07-02 DIAGNOSIS — Y998 Other external cause status: Secondary | ICD-10-CM | POA: Insufficient documentation

## 2015-07-02 DIAGNOSIS — Y9389 Activity, other specified: Secondary | ICD-10-CM | POA: Diagnosis not present

## 2015-07-02 DIAGNOSIS — S8991XA Unspecified injury of right lower leg, initial encounter: Secondary | ICD-10-CM | POA: Insufficient documentation

## 2015-07-02 DIAGNOSIS — R0789 Other chest pain: Secondary | ICD-10-CM

## 2015-07-02 DIAGNOSIS — Z880 Allergy status to penicillin: Secondary | ICD-10-CM | POA: Insufficient documentation

## 2015-07-02 MED ORDER — CYCLOBENZAPRINE HCL 10 MG PO TABS: 10.0000 mg | ORAL_TABLET | Freq: Two times a day (BID) | ORAL | Status: DC | PRN

## 2015-07-02 MED ORDER — HYDROCODONE-ACETAMINOPHEN 5-325 MG PO TABS
2.0000 | ORAL_TABLET | Freq: Once | ORAL | Status: AC
  Administered 2015-07-02: 2 via ORAL
  Filled 2015-07-02: qty 2

## 2015-07-02 MED ORDER — IBUPROFEN 400 MG PO TABS: 400.0000 mg | ORAL_TABLET | Freq: Four times a day (QID) | ORAL | Status: DC | PRN
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
## 2016-06-13 ENCOUNTER — Inpatient Hospital Stay (HOSPITAL_COMMUNITY)
Admission: EM | Admit: 2016-06-13 | Discharge: 2016-06-16 | DRG: 872 | Disposition: A | Discharge status: Home / Self Care | Payer: Medicaid Other | Attending: Internal Medicine | Admitting: Internal Medicine

## 2016-06-13 ENCOUNTER — Emergency Department (HOSPITAL_COMMUNITY): Payer: Medicaid Other

## 2016-06-13 ENCOUNTER — Encounter (HOSPITAL_COMMUNITY): Payer: Self-pay | Admitting: Emergency Medicine

## 2016-06-13 DIAGNOSIS — A419 Sepsis, unspecified organism: Secondary | ICD-10-CM | POA: Diagnosis not present

## 2016-06-13 DIAGNOSIS — Z9861 Coronary angioplasty status: Secondary | ICD-10-CM

## 2016-06-13 DIAGNOSIS — L0390 Cellulitis, unspecified: Secondary | ICD-10-CM | POA: Diagnosis present

## 2016-06-13 DIAGNOSIS — I252 Old myocardial infarction: Secondary | ICD-10-CM

## 2016-06-13 DIAGNOSIS — I803 Phlebitis and thrombophlebitis of lower extremities, unspecified: Secondary | ICD-10-CM

## 2016-06-13 DIAGNOSIS — I809 Phlebitis and thrombophlebitis of unspecified site: Secondary | ICD-10-CM | POA: Diagnosis not present

## 2016-06-13 DIAGNOSIS — Z86718 Personal history of other venous thrombosis and embolism: Secondary | ICD-10-CM

## 2016-06-13 DIAGNOSIS — I8002 Phlebitis and thrombophlebitis of superficial vessels of left lower extremity: Secondary | ICD-10-CM | POA: Diagnosis present

## 2016-06-13 DIAGNOSIS — L03116 Cellulitis of left lower limb: Secondary | ICD-10-CM | POA: Diagnosis not present

## 2016-06-13 DIAGNOSIS — Z23 Encounter for immunization: Secondary | ICD-10-CM

## 2016-06-13 DIAGNOSIS — M79652 Pain in left thigh: Secondary | ICD-10-CM

## 2016-06-13 DIAGNOSIS — I2510 Atherosclerotic heart disease of native coronary artery without angina pectoris: Secondary | ICD-10-CM | POA: Diagnosis present

## 2016-06-13 DIAGNOSIS — Z7982 Long term (current) use of aspirin: Secondary | ICD-10-CM

## 2016-06-13 HISTORY — DX: Other forms of angina pectoris: I20.8

## 2016-06-13 HISTORY — DX: Other forms of angina pectoris: I20.89

## 2016-06-13 LAB — PROTIME-INR
INR: 0.89
PROTHROMBIN TIME: 12 s (ref 11.4–15.2)

## 2016-06-13 LAB — I-STAT TROPONIN, ED: TROPONIN I, POC: 0 ng/mL (ref 0.00–0.08)

## 2016-06-13 LAB — BASIC METABOLIC PANEL
Anion gap: 10 (ref 5–15)
BUN: 17 mg/dL (ref 6–20)
CHLORIDE: 103 mmol/L (ref 101–111)
CO2: 23 mmol/L (ref 22–32)
CREATININE: 1 mg/dL (ref 0.61–1.24)
Calcium: 9 mg/dL (ref 8.9–10.3)
GFR calc Af Amer: >60 mL/min (ref >60)
GFR calc non Af Amer: >60 mL/min (ref >60)
Glucose, Bld: 139 mg/dL — ABNORMAL HIGH (ref 65–99)
POTASSIUM: 3.9 mmol/L (ref 3.5–5.1)
Sodium: 136 mmol/L (ref 135–145)

## 2016-06-13 LAB — I-STAT CHEM 8, ED
BUN: 17 mg/dL (ref 6–20)
CALCIUM ION: 1.07 mmol/L — AB (ref 1.15–1.40)
Chloride: 102 mmol/L (ref 101–111)
Creatinine, Ser: 1 mg/dL (ref 0.61–1.24)
Glucose, Bld: 138 mg/dL — ABNORMAL HIGH (ref 65–99)
HEMATOCRIT: 47 % (ref 39.0–52.0)
HEMOGLOBIN: 16 g/dL (ref 13.0–17.0)
Potassium: 3.9 mmol/L (ref 3.5–5.1)
SODIUM: 139 mmol/L (ref 135–145)
TCO2: 25 mmol/L (ref 0–100)

## 2016-06-13 LAB — CBC WITH DIFFERENTIAL/PLATELET
BASOS ABS: 0.1 10*3/uL (ref 0.0–0.1)
Basophils Relative: 0 %
Eosinophils Absolute: 0.6 10*3/uL (ref 0.0–0.7)
Eosinophils Relative: 4 %
HEMATOCRIT: 44.8 % (ref 39.0–52.0)
Hemoglobin: 15.2 g/dL (ref 13.0–17.0)
LYMPHS PCT: 18 %
Lymphs Abs: 3.2 10*3/uL (ref 0.7–4.0)
MCH: 29.7 pg (ref 26.0–34.0)
MCHC: 33.9 g/dL (ref 30.0–36.0)
MCV: 87.5 fL (ref 78.0–100.0)
MONO ABS: 1.2 10*3/uL — AB (ref 0.1–1.0)
Monocytes Relative: 7 %
NEUTROS ABS: 12 10*3/uL — AB (ref 1.7–7.7)
Neutrophils Relative %: 71 %
PLATELETS: 243 10*3/uL (ref 150–400)
RBC: 5.12 MIL/uL (ref 4.22–5.81)
RDW: 13.1 % (ref 11.5–15.5)
WBC: 17.1 10*3/uL — AB (ref 4.0–10.5)

## 2016-06-13 LAB — APTT: aPTT: 28 seconds (ref 24–36)

## 2016-06-13 LAB — I-STAT CG4 LACTIC ACID, ED: Lactic Acid, Venous: 2.08 mmol/L (ref 0.5–1.9)

## 2016-06-13 LAB — BRAIN NATRIURETIC PEPTIDE: B NATRIURETIC PEPTIDE 5: 43.6 pg/mL (ref 0.0–100.0)

## 2016-06-13 MED ORDER — IOPAMIDOL (ISOVUE-370) INJECTION 76%
100.0000 mL | Freq: Once | INTRAVENOUS | Status: AC | PRN
  Administered 2016-06-13: 100 mL via INTRAVENOUS

## 2016-06-13 MED ORDER — IMIPENEM-CILASTATIN 500 MG IV SOLR
500.0000 mg | Freq: Three times a day (TID) | INTRAVENOUS | Status: DC
  Filled 2016-06-13 (×2): qty 500

## 2016-06-13 MED ORDER — SODIUM CHLORIDE 0.9 % IV SOLN
1000.0000 mg | Freq: Three times a day (TID) | INTRAVENOUS | Status: DC
  Administered 2016-06-13 – 2016-06-16 (×8): 1000 mg via INTRAVENOUS
  Filled 2016-06-13 (×11): qty 1000

## 2016-06-13 MED ORDER — SODIUM CHLORIDE 0.9 % IV BOLUS (SEPSIS)
1000.0000 mL | Freq: Once | INTRAVENOUS | Status: AC
  Administered 2016-06-13: 1000 mL via INTRAVENOUS

## 2016-06-13 MED ORDER — MORPHINE SULFATE (PF) 4 MG/ML IV SOLN
4.0000 mg | Freq: Once | INTRAVENOUS | Status: AC
  Administered 2016-06-14: 4 mg via INTRAVENOUS
  Filled 2016-06-13: qty 1

## 2016-06-13 MED ORDER — ONDANSETRON HCL 4 MG/2ML IJ SOLN
4.0000 mg | Freq: Once | INTRAMUSCULAR | Status: AC
  Administered 2016-06-14: 4 mg via INTRAVENOUS

## 2016-06-13 MED ORDER — HEPARIN (PORCINE) IN NACL 100-0.45 UNIT/ML-% IJ SOLN
2000.0000 [IU]/h | INTRAMUSCULAR | Status: AC
  Administered 2016-06-13 – 2016-06-14 (×2): 1800 [IU]/h via INTRAVENOUS
  Administered 2016-06-15: 2000 [IU]/h via INTRAVENOUS
  Filled 2016-06-13 (×3): qty 250

## 2016-06-13 MED ORDER — ONDANSETRON HCL 4 MG/2ML IJ SOLN
4.0000 mg | Freq: Once | INTRAMUSCULAR | Status: DC
  Filled 2016-06-13: qty 2

## 2016-06-13 MED ORDER — HEPARIN BOLUS VIA INFUSION
4000.0000 [IU] | Freq: Once | INTRAVENOUS | Status: AC
  Administered 2016-06-13: 4000 [IU] via INTRAVENOUS
  Filled 2016-06-13: qty 4000

## 2016-06-13 MED ORDER — VANCOMYCIN HCL IN DEXTROSE 1-5 GM/200ML-% IV SOLN: 1000.0000 mg | Freq: Two times a day (BID) | INTRAVENOUS | Status: DC

## 2016-06-13 MED ORDER — IOPAMIDOL (ISOVUE-370) INJECTION 76%
INTRAVENOUS | Status: AC
  Administered 2016-06-13: 22:00:00
  Filled 2016-06-13: qty 100

## 2016-06-13 MED ORDER — VANCOMYCIN HCL 10 G IV SOLR
2500.0000 mg | INTRAVENOUS | Status: AC
  Administered 2016-06-13: 2500 mg via INTRAVENOUS
  Filled 2016-06-13: qty 500

## 2016-06-13 NOTE — ED Provider Notes (Signed)
[place] DEPT Provider Note   CSN: [id number] Arrival date & time: 06/13/16  2036     History   Chief Complaint Chief Complaint  Patient presents with  . Leg Pain    HPI Mary Gavrilov is a 55 y.o. male.  HPI 55 year old male with past medical history of DVT and coronary artery disease here with atraumatic left leg pain and swelling. Patient states his symptoms started 2 days ago as mild redness, firmness in his medial lower calf. This has now extended and spread up his leg to his groin with severe redness, pain, and swelling along the medial aspect of his entire leg. His symptoms are similar to his previous episode of DVT, although he did not have this redness at the time. He describes the pain as an aching, throbbing sensation that is worse with movement as well as any weightbearing. Denies any fevers. Of note, he has also had some mild chest pain and shortness of breath with a dry, hacking cough. Denies any hemoptysis. Denies history of PE. He is not currently on any blood thinners.  Past Medical History:  Diagnosis Date  . Angina at rest [facility])   . Coronary artery disease     Patient Active Problem List   Diagnosis Date Noted  . CAD (coronary artery disease) 06/14/2016  . Cellulitis 06/14/2016  . Sepsis ([place]) 06/13/2016  . Phlebitis 06/13/2016    Past Surgical History:  Procedure Laterality Date  . BACK SURGERY    . CORONARY ANGIOPLASTY         Home Medications    Prior to Admission medications   Medication Sig Start Date End Date Taking? Authorizing Provider  aspirin EC 81 MG tablet Take 81 mg by mouth daily.   Yes Historical Provider, MD  naproxen (NAPROSYN) 250 MG tablet Take 440 mg by mouth 2 (two) times daily as needed (pain).    Yes Historical Provider, MD  cyclobenzaprine (FLEXERIL) 10 MG tablet Take 1 tablet (10 mg total) by mouth 2 (two) times daily as needed for muscle spasms. Patient not taking: Reported on 06/13/2016 07/02/15   Comer Locket,  PA-C    Family History Family History  Problem Relation Age of Onset  . CAD Mother   . Diabetes Mother   . CAD Father   . Prostate cancer Father   . Diabetes Brother   . Stroke Neg Hx     Social History Social History  Substance Use Topics  . Smoking status: Never Smoker  . Smokeless tobacco: Never Used  . Alcohol use No     Comment: occasional     Allergies   Penicillins and Demerol [meperidine]   Review of Systems Review of Systems  Constitutional: Negative for chills, fatigue and fever.  HENT: Negative for congestion and rhinorrhea.   Eyes: Negative for visual disturbance.  Respiratory: Negative for cough, shortness of breath and wheezing.   Cardiovascular: Positive for leg swelling. Negative for chest pain.  Gastrointestinal: Negative for abdominal pain, diarrhea, nausea and vomiting.  Genitourinary: Negative for dysuria and flank pain.  Musculoskeletal: Positive for gait problem. Negative for neck pain and neck stiffness.  Skin: Negative for rash and wound.  Allergic/Immunologic: Negative for immunocompromised state.  Neurological: Negative for syncope, weakness and headaches.  All other systems reviewed and are negative.    Physical Exam Updated Vital Signs BP 158/92 (BP Location: Left Arm)   Pulse 86   Temp 98.9 F (37.2 C) (Oral)   Resp 18   Ht 6' (  1.829 m)   Wt 278 lb (126.1 kg)   SpO2 94%   BMI 37.70 kg/m   Physical Exam  Constitutional: He is oriented to person, place, and time. He appears well-developed and well-nourished. No distress.  HENT:  Head: Normocephalic and atraumatic.  Eyes: Conjunctivae are normal.  Neck: Neck supple.  Cardiovascular: Normal rate, regular rhythm and normal heart sounds.  Exam reveals no friction rub.   No murmur heard. Pulmonary/Chest: Effort normal and breath sounds normal. No respiratory distress. He has no wheezes. He has no rales.  Abdominal: He exhibits no distension.  Musculoskeletal: He exhibits edema.   Neurological: He is alert and oriented to person, place, and time. He exhibits normal muscle tone.  Skin: Skin is warm. Capillary refill takes less than 2 seconds.  Psychiatric: He has a normal mood and affect.  Nursing note and vitals reviewed.   LOWER EXTREMITY EXAM: Left  INSPECTION & PALPATION: Palpable cord along saphenous vein distribution with overlying skin induration and exquisite tenderness to palpation. Erythema extends from the inguinal fold to the mid lower leg.   SENSORY: sensation is intact to light touch in:  Superficial peroneal nerve distribution (over dorsum of foot) Deep peroneal nerve distribution (over first dorsal web space) Sural nerve distribution (over lateral aspect 5th metatarsal) Saphenous nerve distribution (over medial instep)  MOTOR:  + Motor EHL (great toe dorsiflexion) + FHL (great toe plantar flexion)  + TA (ankle dorsiflexion)  + GSC (ankle plantar flexion)  VASCULAR: 2+ dorsalis pedis and posterior tibialis pulses Capillary refill < 2 sec, toes warm and well-perfused  COMPARTMENTS: Soft, warm, well-perfused No pain with passive extension No parethesias          ED Treatments / Results  Labs (all labs ordered are listed, but only abnormal results are displayed) Labs Reviewed  CBC WITH DIFFERENTIAL/PLATELET - Abnormal; Notable for the following:       Result Value   WBC 17.1 (*)    Neutro Abs 12.0 (*)    Monocytes Absolute 1.2 (*)    All other components within normal limits  BASIC METABOLIC PANEL - Abnormal; Notable for the following:    Glucose, Bld 139 (*)    All other components within normal limits  I-STAT CHEM 8, ED - Abnormal; Notable for the following:    Glucose, Bld 138 (*)    Calcium, Ion 1.07 (*)    All other components within normal limits  I-STAT CG4 LACTIC ACID, ED - Abnormal; Notable for the following:    Lactic Acid, Venous 2.08 (*)    All other components within normal limits  CULTURE, BLOOD (ROUTINE X  2)  CULTURE, BLOOD (ROUTINE X 2)  BRAIN NATRIURETIC PEPTIDE  APTT  PROTIME-INR  HEPARIN LEVEL (UNFRACTIONATED)  CBC  I-STAT TROPOININ, ED  I-STAT CG4 LACTIC ACID, ED    EKG  EKG Interpretation None       Radiology Ct Angio Chest Pe W And/or Wo Contrast  Result Date: 06/13/2016 CLINICAL DATA:  Acute onset of generalized chest pain. Recently diagnosed with left lower extremity thrombophlebitis. Initial encounter. EXAM: CT ANGIOGRAPHY CHEST WITH CONTRAST TECHNIQUE: Multidetector CT imaging of the chest was performed using the standard protocol during bolus administration of intravenous contrast. Multiplanar CT image reconstructions and MIPs were obtained to evaluate the vascular anatomy. CONTRAST:  100 mL of Isovue 370 IV contrast COMPARISON:  Chest radiograph performed 07/02/2015 FINDINGS: Cardiovascular: There is no evidence of central pulmonary embolus. Evaluation for pulmonary embolus is somewhat suboptimal due  to limitations in the timing of the contrast bolus. The heart remains normal in size. No calcific atherosclerotic disease is seen. The great vessels are grossly unremarkable. The thoracic aorta is within normal limits. Mediastinum/Nodes: The mediastinum is unremarkable in appearance. No mediastinal lymphadenopathy is seen. No pericardial effusion is identified. Trace fluid and air in the esophagus is likely transient in nature. The visualized portions of the thyroid gland are unremarkable. No axillary lymphadenopathy is seen. Lungs/Pleura: A 5 mm nodule is noted at the right lower lobe (image 52 of 83), while a 3 mm peripheral nodule is noted at the right middle lobe (image 47 of 83). No pleural effusion or pneumothorax is seen. Upper Abdomen: The visualized portions of the liver and spleen are unremarkable. Musculoskeletal: No acute osseous abnormalities are identified. The visualized musculature is unremarkable in appearance. Review of the MIP images confirms the above findings.  IMPRESSION: 1. No evidence of central pulmonary embolus. Evaluation for pulmonary embolus is somewhat suboptimal due to limitations in the timing of the contrast bolus. 2. No acute cardiopulmonary process seen. 3. Small right-sided pulmonary nodules, measuring up to 5 mm in size. No follow-up needed if patient is low-risk (and has no known or suspected primary neoplasm). Non-contrast chest CT can be considered in 12 months if patient is high-risk. This recommendation follows the consensus statement: Guidelines for Management of Incidental Pulmonary Nodules Detected on CT Images: From the Fleischner Society 2017; Radiology 2017; 284:228-243. Electronically Signed   By: Garald Balding M.D.   On: 06/13/2016 22:46    Procedures Procedures (including critical care time)  Medications Ordered in ED Medications  vancomycin (VANCOCIN) 2,500 mg in sodium chloride 0.9 % 500 mL IVPB (2,500 mg Intravenous New Bag/Given 06/13/16 2327)  imipenem-cilastatin (PRIMAXIN) 1,000 mg in sodium chloride 0.9 % 250 mL IVPB (0 mg Intravenous Stopped 06/13/16 2345)  vancomycin (VANCOCIN) IVPB 1000 mg/200 mL premix (not administered)  heparin bolus via infusion 4,000 Units (4,000 Units Intravenous Bolus from Bag 06/13/16 2305)    Followed by  heparin ADULT infusion 100 units/mL (25000 units/270mL sodium chloride 0.45%) (1,800 Units/hr Intravenous New Bag/Given 06/13/16 2306)  ondansetron (ZOFRAN) injection 4 mg (4 mg Intravenous Not Given 06/14/16 0005)  sodium chloride 0.9 % bolus 1,000 mL (0 mLs Intravenous Stopped 06/13/16 2344)  iopamidol (ISOVUE-370) 76 % injection 100 mL (100 mLs Intravenous Contrast Given 06/13/16 2207)  iopamidol (ISOVUE-370) 76 % injection (  Contrast Given 06/13/16 2211)  morphine 4 MG/ML injection 4 mg (4 mg Intravenous Given 06/14/16 0000)  ondansetron (ZOFRAN) injection 4 mg (4 mg Intravenous Given 06/14/16 0000)    EMERGENCY DEPARTMENT US SOFT TISSUE INTERPRETATION "Study: Limited Ultrasound  of the noted body part in comments below"  INDICATIONS: Pain and Soft tissue infection Multiple views of the body part are obtained with a multi-frequency linear probe  PERFORMED BY:  Myself  IMAGES ARCHIVED?: Yes  SIDE:Left  BODY PART:Lower extremity  FINDINGS: Visualized saphenous vein is full of occlusive clot with no flow. The vein is not compressible. There is surrounding soft tissue edema and cobblestoning, concerning for cellulitis versus septic phlebitis. Visualized CFV unremarkable and compressible.  LIMITATIONS:  Emergent Procedure, Pain  INTERPRETATION:  No abcess noted and Cellulitis present   Initial Impression / Assessment and Plan / ED Course  I have reviewed the triage vital signs and the nursing notes.  Pertinent labs & imaging results that were available during my care of the patient were reviewed by me and considered in my medical decision  making (see chart for details).  Clinical Course     55 year old male with history of previous left DVT here with a several day history of atraumatic left leg swelling and pain. See exam and history as above. On arrival, vital signs are stable. Exam is concerning for saphenous vein thrombosis with overlying phlebitis versus cellulitis versus septic thrombophlebitis. Labwork is also concerning for sepsis with elevated lactate and leukocytosis of 17,000. Blood cultures have been drawn. Will start IV vancomycin and imipenem based on pharmacy recommendations. I discussed the case, presentation, and lab work with Dr. Bridgett Larsson, who has a low suspicion but recommends continuing treatment, formal ultrasound in the morning, and further consultation as needed. Patient does have 2+ DP and PT pulses with no evidence of phlegmasia at this time. Will admit to the hospitalist service. Of note, given  complaints of chest pain, CT scan obtained. While bolus timing was suboptimal, there are no evidence of septic emboli in the lungs.  Final Clinical  Impressions(s) / ED Diagnoses   Final diagnoses:  Phlebitis of left leg  Cellulitis of left lower extremity     Duffy Bruce, MD 06/14/16 0107

## 2016-06-13 NOTE — Progress Notes (Signed)
ANTICOAGULATION CONSULT NOTE - Initial Consult  Pharmacy Consult for Heparin Indication: r/o DVT  Allergies  Allergen Reactions  . Penicillins Anaphylaxis    Has patient had a PCN reaction causing immediate rash, facial/tongue/throat swelling, SOB or lightheadedness with hypotension: unknown Has patient had a PCN reaction causing severe rash involving mucus membranes or skin necrosis: unknown Has patient had a PCN reaction that required hospitalization: unknown Has patient had a PCN reaction occurring within the last 10 years: no If all of the above answers are "NO", then may proceed with Cephalosporin use.   . Demerol [Meperidine] Other (See Comments)    REACTION:  Increases blood pressure    Patient Measurements: Height: 6' (182.9 cm) Weight: 278 lb (126.1 kg) IBW/kg (Calculated) : 77.6 Heparin Dosing Weight: 105.7 kg  Vital Signs: Temp: 98.9 F (37.2 C) (11/19 2040) Temp Source: Oral (11/19 2040) BP: 163/93 (11/19 2040) Pulse Rate: 93 (11/19 2040)  Labs:  Recent Labs  06/13/16 2124 06/13/16 2136 06/13/16 2152  HGB 15.2  --  16.0  HCT 44.8  --  47.0  PLT 243  --   --   CREATININE  --  1.00 1.00    Estimated Creatinine Clearance: 111.8 mL/min (by C-G formula based on SCr of 1 mg/dL).   Medical History: Past Medical History:  Diagnosis Date  . Angina at rest [facility])   . Coronary artery disease     Assessment:  55 yr male with PMH significant for DVT in 2011 and currently on no oral anticoagulation.  Presents to ED with left leg pain and redness.  Pt known to pharmacy from recent consulation for dosing of vancomycin.  CTAngio shows no evidence of PE  Pharmacy consulted to begin IV heparin for treatment of r/o DVT  Goal of Therapy:  Heparin level 0.3-0.7 units/ml Monitor platelets by anticoagulation protocol: Yes   Plan:   Obtain baseline aPTT and PT/INR  Heparin 4000 unit IV bolus x 1 followed by heparin infusion @ 1800 units/hr  Check heparin  level 6 hr after heparin started  Follow heparin level and CBC daily  Laporscha Linehan, Toribio Harbour, PharmD 06/13/2016,10:55 PM

## 2016-06-13 NOTE — Progress Notes (Addendum)
Pharmacy Antibiotic Note  Clayton Pena is a 55 y.o. male admitted on 06/13/2016 with wound infection/septic phlebitis  PMH significant for blood clot in 2011 (on no oral anticoagulation at this time).  Pharmacy has been consulted for Vancomycin & Primaxin dosing.   Plan:  Vancomycin 2500mg  IV x 1 dose followed by 1gm IV q12h  Vancomycin trough level = 15-20 mcg/ml  Primaxin to 1gm IV q8h (CrCl > 90 ml/min)  F/U cultures  Height: 6' (182.9 cm) Weight: 278 lb (126.1 kg) IBW/kg (Calculated) : 77.6  Temp (24hrs), Avg:98.9 F (37.2 C), Min:98.9 F (37.2 C), Max:98.9 F (37.2 C)   Recent Labs Lab 06/13/16 2124 06/13/16 2136 06/13/16 2147 06/13/16 2152  WBC 17.1*  --   --   --   CREATININE  --  1.00  --  1.00  LATICACIDVEN  --   --  2.08*  --     Estimated Creatinine Clearance: 111.8 mL/min (by C-G formula based on SCr of 1 mg/dL).    Allergies  Allergen Reactions  . Penicillins Anaphylaxis    Has patient had a PCN reaction causing immediate rash, facial/tongue/throat swelling, SOB or lightheadedness with hypotension: unknown Has patient had a PCN reaction causing severe rash involving mucus membranes or skin necrosis: unknown Has patient had a PCN reaction that required hospitalization: unknown Has patient had a PCN reaction occurring within the last 10 years: no If all of the above answers are "NO", then may proceed with Cephalosporin use.   . Demerol [Meperidine] Other (See Comments)    REACTION:  Increases blood pressure    Antimicrobials this admission: 11/19 Vanc >>   11/19 Primaxin >>    Dose adjustments this admission:    Microbiology results: 11/19 BCx: sent  Thank you for allowing pharmacy to be a part of this patient's care.  Everette Rank, PharmD 06/13/2016 10:34 PM

## 2016-06-13 NOTE — ED Triage Notes (Addendum)
Pt from home with complaints of left leg pain and redness that is painful and warm to the touch. Pt states that this began on Tuesday and has gotten progressively worse. Pt has a hx of blood clot in 2011. Pt is currently not on blood thinners. Pt is ambulatory with a cane which is his baseline. Pt rates his leg pain at 6/10. Redness in leg is streaky in appearance

## 2016-06-13 NOTE — ED Notes (Signed)
Informed Dr. Ellender Hose of lactic acid of 2.08 @ 2150 by QA

## 2016-06-13 NOTE — H&P (Signed)
Clayton Pena [id number] DOB: [birth year] DOA: 06/13/2016     [facility]:  [facility]  Patient coming from:   home Lives  With family    Chief Complaint: Left leg pain  HPI: Clayton Pena is a 55 y.o. male with medical history significant of MI in 2011 patient states there was secondary to clot in his heart    Presented with left leg pain for the past 5 days. His skin progressively worse associated redness and swelling. Leg is warm to the touch.  Noticed red streak going up to the groin  he does not recollect any trauma to his leg. He feels that his symptoms are similar to the prior episode when he had a DVT but associated remove or redness. He haven't had any chest pain shortness of breath no nausea no vomiting had low grade fever last night.  Last night he had some occasional sharp chest pain with some chest tightness.  Regarding pertinent Chronic problems: Examination has known history of coronary artery disease status post angioplasty currently on aspirin   IN ER:  Temp (24hrs), Avg:98.9 F (37.2 C), Min:98.9 F (37.2 C), Max:98.9 F (37.2 C)      HR 86 BP 158/92 Lactic acid to be 2.08 WBC 17.1  Bedside ultrasound showed saphenous vein clot ER M.D. was unable to evaluate femoral veins Following Medications were ordered in ER: Medications  vancomycin (VANCOCIN) 2,500 mg in sodium chloride 0.9 % 500 mL IVPB (2,500 mg Intravenous New Bag/Given 06/13/16 2327)  imipenem-cilastatin (PRIMAXIN) 1,000 mg in sodium chloride 0.9 % 250 mL IVPB (0 mg Intravenous Stopped 06/13/16 2345)  vancomycin (VANCOCIN) IVPB 1000 mg/200 mL premix (not administered)  heparin bolus via infusion 4,000 Units (4,000 Units Intravenous Bolus from Bag 06/13/16 2305)    Followed by  heparin ADULT infusion 100 units/mL (25000 units/234mL sodium chloride 0.45%) (1,800 Units/hr Intravenous New Bag/Given 06/13/16 2306)  ondansetron (ZOFRAN) injection 4 mg (not administered)  morphine 4  MG/ML injection 4 mg (not administered)  ondansetron (ZOFRAN) injection 4 mg (not administered)  sodium chloride 0.9 % bolus 1,000 mL (0 mLs Intravenous Stopped 06/13/16 2344)  iopamidol (ISOVUE-370) 76 % injection 100 mL (100 mLs Intravenous Contrast Given 06/13/16 2207)  iopamidol (ISOVUE-370) 76 % injection (  Contrast Given 06/13/16 2211)     ER provider discussed case with:  Vascular surgery recommends admission for IV heparin A and antibiotic initiation with  Doppler performed tomorrow to evaluate for DVT  Hospitalist was called for admission for sepsis secondary to cellulitis secondary to phlebitis versus possible DVT  Review of Systems:    Pertinent positives include: leg pain and swelling  Constitutional:  No weight loss, night sweats, Fevers, chills, fatigue, weight loss  HEENT:  No headaches, Difficulty swallowing,Tooth/dental problems,Sore throat,  No sneezing, itching, ear ache, nasal congestion, post nasal drip,  Cardio-vascular:  No chest pain, Orthopnea, PND, anasarca, dizziness, palpitations.no Bilateral lower extremity swelling  GI:  No heartburn, indigestion, abdominal pain, nausea, vomiting, diarrhea, change in bowel habits, loss of appetite, melena, blood in stool, hematemesis Resp:  no shortness of breath at rest. No dyspnea on exertion, No excess mucus, no productive cough, No non-productive cough, No coughing up of blood.No change in color of mucus.No wheezing. Skin:  no rash or lesions. No jaundice GU:  no dysuria, change in color of urine, no urgency or frequency. No straining to urinate.  No flank pain.  Musculoskeletal:  No joint pain or no joint swelling. No decreased  range of motion. No back pain.  Psych:  No change in mood or affect. No depression or anxiety. No memory loss.  Neuro: no localizing neurological complaints, no tingling, no weakness, no double vision, no gait abnormality, no slurred speech, no confusion  As per HPI otherwise 10 point  review of systems negative.   Past Medical History: Past Medical History:  Diagnosis Date  . Angina at rest [facility])   . Coronary artery disease    Past Surgical History:  Procedure Laterality Date  . BACK SURGERY    . CORONARY ANGIOPLASTY       Social History:  Ambulatory   Cane,     reports that he has never smoked. He has never used smokeless tobacco. He reports that he does not drink alcohol or use drugs.  Allergies:   Allergies  Allergen Reactions  . Penicillins Anaphylaxis    Has patient had a PCN reaction causing immediate rash, facial/tongue/throat swelling, SOB or lightheadedness with hypotension: unknown Has patient had a PCN reaction causing severe rash involving mucus membranes or skin necrosis: unknown Has patient had a PCN reaction that required hospitalization: unknown Has patient had a PCN reaction occurring within the last 10 years: no If all of the above answers are "NO", then may proceed with Cephalosporin use.   . Demerol [Meperidine] Other (See Comments)    REACTION:  Increases blood pressure       Family History:   Family History  Problem Relation Age of Onset  . CAD Mother   . Diabetes Mother   . CAD Father   . Prostate cancer Father   . Diabetes Brother   . Stroke Neg Hx     Medications: Prior to Admission medications   Medication Sig Start Date End Date Taking? Authorizing Provider  aspirin EC 81 MG tablet Take 81 mg by mouth daily.   Yes Historical Provider, MD  naproxen (NAPROSYN) 250 MG tablet Take 440 mg by mouth 2 (two) times daily as needed (pain).    Yes Historical Provider, MD  cyclobenzaprine (FLEXERIL) 10 MG tablet Take 1 tablet (10 mg total) by mouth 2 (two) times daily as needed for muscle spasms. Patient not taking: Reported on 06/13/2016 07/02/15   Comer Locket, PA-C    Physical Exam: Patient Vitals for the past 24 hrs:  BP Temp Temp src Pulse Resp SpO2 Height Weight  06/13/16 2301 158/92 - - 86 18 94 % - -    06/13/16 2040 163/93 98.9 F (37.2 C) Oral 93 17 95 % 6' (1.829 m) 126.1 kg (278 lb)    1. General:  in No Acute distress 2. Psychological: Alert and  Oriented 3. Head/ENT:     Dry Mucous Membranes                          Head Non traumatic, neck supple                          Normal   Dentition 4. SKIN:  decreased Skin turgor,  Skin clean Dry and intact no rashRedness noted over left lower extremity 5. Heart: Regular rate and rhythm no   Murmur, Rub or gallop 6. Lungs:  no wheezes or crackles   7. Abdomen: Soft,   non-tender, Non distended 8. Lower extremities: no clubbing, cyanosis,   edema of left lower extremity      9. Neurologically Grossly intact, moving all 4  extremities equally  10. MSK: Normal range of motion   body mass index is 37.7 kg/m.  Labs on Admission:   Labs on Admission: I have personally reviewed following labs and imaging studies  CBC:  Recent Labs Lab 06/13/16 2124 06/13/16 2152  WBC 17.1*  --   NEUTROABS 12.0*  --   HGB 15.2 16.0  HCT 44.8 47.0  MCV 87.5  --   PLT 243  --    Basic Metabolic Panel:  Recent Labs Lab 06/13/16 2136 06/13/16 2152  NA 136 139  K 3.9 3.9  CL 103 102  CO2 23  --   GLUCOSE 139* 138*  BUN 17 17  CREATININE 1.00 1.00  CALCIUM 9.0  --    GFR: Estimated Creatinine Clearance: 111.8 mL/min (by C-G formula based on SCr of 1 mg/dL). Liver Function Tests: No results for input(s): AST, ALT, ALKPHOS, BILITOT, PROT, ALBUMIN in the last 168 hours. No results for input(s): LIPASE, AMYLASE in the last 168 hours. No results for input(s): AMMONIA in the last 168 hours. Coagulation Profile:  Recent Labs Lab 06/13/16 2130  INR 0.89   Cardiac Enzymes: No results for input(s): CKTOTAL, CKMB, CKMBINDEX, TROPONINI in the last 168 hours. BNP (last 3 results) No results for input(s): PROBNP in the last 8760 hours. HbA1C: No results for input(s): HGBA1C in the last 72 hours. CBG: No results for input(s): GLUCAP in  the last 168 hours. Lipid Profile: No results for input(s): CHOL, HDL, LDLCALC, TRIG, CHOLHDL, LDLDIRECT in the last 72 hours. Thyroid Function Tests: No results for input(s): TSH, T4TOTAL, FREET4, T3FREE, THYROIDAB in the last 72 hours. Anemia Panel: No results for input(s): VITAMINB12, FOLATE, FERRITIN, TIBC, IRON, RETICCTPCT in the last 72 hours.  Sepsis Labs: @LABRCNTIP (procalcitonin:4,lacticidven:4) )No results found for this or any previous visit (from the past 240 hour(s)).     UA  not ordered  Lab Results  Component Value Date   HGBA1C  11/07/2008    5.4 (NOTE) The ADA recommends the following therapeutic goal for glycemic control related to Hgb A1c measurement: Goal of therapy: <6.5 Hgb A1c  Reference: American Diabetes Association: Clinical Practice Recommendations 2010, Diabetes Care, 2010, 33: (Suppl  1).    Estimated Creatinine Clearance: 111.8 mL/min (by C-G formula based on SCr of 1 mg/dL).  BNP (last 3 results) No results for input(s): PROBNP in the last 8760 hours.   ECG REPORT Not obtained  Filed Weights   06/13/16 2040  Weight: 126.1 kg (278 lb)     Cultures: No results found for: SDES, SPECREQUEST, CULT, REPTSTATUS   Radiological Exams on Admission: Ct Angio Chest Pe W And/or Wo Contrast  Result Date: 06/13/2016 CLINICAL DATA:  Acute onset of generalized chest pain. Recently diagnosed with left lower extremity thrombophlebitis. Initial encounter. EXAM: CT ANGIOGRAPHY CHEST WITH CONTRAST TECHNIQUE: Multidetector CT imaging of the chest was performed using the standard protocol during bolus administration of intravenous contrast. Multiplanar CT image reconstructions and MIPs were obtained to evaluate the vascular anatomy. CONTRAST:  100 mL of Isovue 370 IV contrast COMPARISON:  Chest radiograph performed 07/02/2015 FINDINGS: Cardiovascular: There is no evidence of central pulmonary embolus. Evaluation for pulmonary embolus is somewhat suboptimal due to  limitations in the timing of the contrast bolus. The heart remains normal in size. No calcific atherosclerotic disease is seen. The great vessels are grossly unremarkable. The thoracic aorta is within normal limits. Mediastinum/Nodes: The mediastinum is unremarkable in appearance. No mediastinal lymphadenopathy is seen. No pericardial effusion is  identified. Trace fluid and air in the esophagus is likely transient in nature. The visualized portions of the thyroid gland are unremarkable. No axillary lymphadenopathy is seen. Lungs/Pleura: A 5 mm nodule is noted at the right lower lobe (image 52 of 83), while a 3 mm peripheral nodule is noted at the right middle lobe (image 47 of 83). No pleural effusion or pneumothorax is seen. Upper Abdomen: The visualized portions of the liver and spleen are unremarkable. Musculoskeletal: No acute osseous abnormalities are identified. The visualized musculature is unremarkable in appearance. Review of the MIP images confirms the above findings. IMPRESSION: 1. No evidence of central pulmonary embolus. Evaluation for pulmonary embolus is somewhat suboptimal due to limitations in the timing of the contrast bolus. 2. No acute cardiopulmonary process seen. 3. Small right-sided pulmonary nodules, measuring up to 5 mm in size. No follow-up needed if patient is low-risk (and has no known or suspected primary neoplasm). Non-contrast chest CT can be considered in 12 months if patient is high-risk. This recommendation follows the consensus statement: Guidelines for Management of Incidental Pulmonary Nodules Detected on CT Images: From the Fleischner Society 2017; Radiology 2017; 284:228-243. Electronically Signed   By: Garald Balding M.D.   On: 06/13/2016 22:46    Chart has been reviewed    Assessment/Plan  55 y.o. male with medical history significant of MI in 2011 patient states there was secondary to clot in his heart being admitted for cellulitis and sepsis associated with  phlebitis versus DVT  Present on Admission: . Sepsis ([place]) meeting sepsis criteria given fever at home and elevated white blood cell, elevated lactic acid Patient case was discussed by ER provided with vascular surgery who recommends Enterobacter coverage patient was started on imipenem given history of anaphylactic reaction to penicillins after discussion with pharmacy await results of blood cultures . Phlebitis - as per discussion with vascular surgerysurgery started on heparin will obtain Dopplers of lower extremity in a.m. Marland Kitchen CAD (coronary artery disease) continue aspirin patient reports vague chest pain appears to be atypical but will cycle cardiac enzymes CT chest negative for large PE . Cellulitis related to underlying phlebitis will treat broadly as per recommendations of vascular surgeon treated with heparin appreciate vascular surgery input Chest pain given fevers and obtain echogram to evaluate for any cardiac abnormality  Other plan as per orders.  DVT prophylaxis:  heparin  Code Status:  FULL CODE   as per patient    Family Communication:   Family not  at  Bedside    Disposition Plan:    To home once workup is complete and patient is stable                             Consults called: Vascular surgery called by ER MD   Admission status:     inpatient   Level of care     tele          I have spent a total of 56 min on this admission     Anyae Griffith 06/14/2016, 12:52 AM    [facility]  Pager [phone]   after 2 AM please page floor coverage PA If 7AM-7PM, please contact the day team taking care of the patient  [email or web address]  Password [email or web address]

## 2016-06-14 ENCOUNTER — Inpatient Hospital Stay (HOSPITAL_COMMUNITY): Payer: Medicaid Other

## 2016-06-14 ENCOUNTER — Encounter (HOSPITAL_COMMUNITY): Payer: Self-pay | Admitting: Internal Medicine

## 2016-06-14 DIAGNOSIS — I803 Phlebitis and thrombophlebitis of lower extremities, unspecified: Secondary | ICD-10-CM

## 2016-06-14 DIAGNOSIS — Z7982 Long term (current) use of aspirin: Secondary | ICD-10-CM | POA: Diagnosis not present

## 2016-06-14 DIAGNOSIS — Z9861 Coronary angioplasty status: Secondary | ICD-10-CM | POA: Diagnosis not present

## 2016-06-14 DIAGNOSIS — R079 Chest pain, unspecified: Secondary | ICD-10-CM | POA: Diagnosis not present

## 2016-06-14 DIAGNOSIS — M79609 Pain in unspecified limb: Secondary | ICD-10-CM

## 2016-06-14 DIAGNOSIS — I2510 Atherosclerotic heart disease of native coronary artery without angina pectoris: Secondary | ICD-10-CM | POA: Diagnosis present

## 2016-06-14 DIAGNOSIS — M79662 Pain in left lower leg: Secondary | ICD-10-CM | POA: Diagnosis present

## 2016-06-14 DIAGNOSIS — Z23 Encounter for immunization: Secondary | ICD-10-CM | POA: Diagnosis not present

## 2016-06-14 DIAGNOSIS — Z86718 Personal history of other venous thrombosis and embolism: Secondary | ICD-10-CM | POA: Diagnosis not present

## 2016-06-14 DIAGNOSIS — L03116 Cellulitis of left lower limb: Secondary | ICD-10-CM | POA: Diagnosis present

## 2016-06-14 DIAGNOSIS — I252 Old myocardial infarction: Secondary | ICD-10-CM | POA: Diagnosis not present

## 2016-06-14 DIAGNOSIS — L0390 Cellulitis, unspecified: Secondary | ICD-10-CM | POA: Diagnosis present

## 2016-06-14 DIAGNOSIS — I809 Phlebitis and thrombophlebitis of unspecified site: Secondary | ICD-10-CM | POA: Diagnosis not present

## 2016-06-14 DIAGNOSIS — A419 Sepsis, unspecified organism: Secondary | ICD-10-CM | POA: Diagnosis present

## 2016-06-14 DIAGNOSIS — I8002 Phlebitis and thrombophlebitis of superficial vessels of left lower extremity: Secondary | ICD-10-CM | POA: Diagnosis present

## 2016-06-14 LAB — LACTIC ACID, PLASMA
Lactic Acid, Venous: 1 mmol/L (ref 0.5–1.9)
Lactic Acid, Venous: 0.9 mmol/L (ref 0.5–1.9)

## 2016-06-14 LAB — COMPREHENSIVE METABOLIC PANEL
ALT: 27 U/L (ref 17–63)
AST: 18 U/L (ref 15–41)
Albumin: 3.3 g/dL — ABNORMAL LOW (ref 3.5–5.0)
Alkaline Phosphatase: 37 U/L — ABNORMAL LOW (ref 38–126)
Anion gap: 6 (ref 5–15)
BUN: 14 mg/dL (ref 6–20)
CHLORIDE: 108 mmol/L (ref 101–111)
CO2: 23 mmol/L (ref 22–32)
CREATININE: 0.88 mg/dL (ref 0.61–1.24)
Calcium: 8.1 mg/dL — ABNORMAL LOW (ref 8.9–10.3)
GFR calc non Af Amer: >60 mL/min (ref >60)
Glucose, Bld: 115 mg/dL — ABNORMAL HIGH (ref 65–99)
POTASSIUM: 3.7 mmol/L (ref 3.5–5.1)
SODIUM: 137 mmol/L (ref 135–145)
Total Bilirubin: 0.6 mg/dL (ref 0.3–1.2)
Total Protein: 5.8 g/dL — ABNORMAL LOW (ref 6.5–8.1)

## 2016-06-14 LAB — I-STAT CG4 LACTIC ACID, ED: Lactic Acid, Venous: 1.78 mmol/L (ref 0.5–1.9)

## 2016-06-14 LAB — CBC
HEMATOCRIT: 39.4 % (ref 39.0–52.0)
Hemoglobin: 13.4 g/dL (ref 13.0–17.0)
MCH: 29.3 pg (ref 26.0–34.0)
MCHC: 34 g/dL (ref 30.0–36.0)
MCV: 86 fL (ref 78.0–100.0)
PLATELETS: 195 10*3/uL (ref 150–400)
RBC: 4.58 MIL/uL (ref 4.22–5.81)
RDW: 13 % (ref 11.5–15.5)
WBC: 15.7 10*3/uL — AB (ref 4.0–10.5)

## 2016-06-14 LAB — HEPARIN LEVEL (UNFRACTIONATED)
HEPARIN UNFRACTIONATED: 0.36 [IU]/mL (ref 0.30–0.70)
HEPARIN UNFRACTIONATED: 0.43 [IU]/mL (ref 0.30–0.70)
Heparin Unfractionated: 0.29 IU/mL — ABNORMAL LOW (ref 0.30–0.70)

## 2016-06-14 LAB — ECHOCARDIOGRAM COMPLETE
HEIGHTINCHES: 72 in
WEIGHTICAEL: 5139.2 [oz_av]

## 2016-06-14 LAB — PHOSPHORUS: PHOSPHORUS: 4.2 mg/dL (ref 2.5–4.6)

## 2016-06-14 LAB — TROPONIN I: Troponin I: <0.03 ng/mL (ref <0.03)

## 2016-06-14 LAB — PROCALCITONIN

## 2016-06-14 LAB — TSH: TSH: 4.389 u[IU]/mL (ref 0.350–4.500)

## 2016-06-14 LAB — MAGNESIUM: Magnesium: 2 mg/dL (ref 1.7–2.4)

## 2016-06-14 MED ORDER — ONDANSETRON HCL 4 MG/2ML IJ SOLN
4.0000 mg | Freq: Four times a day (QID) | INTRAMUSCULAR | Status: DC | PRN
  Administered 2016-06-14: 4 mg via INTRAVENOUS
  Filled 2016-06-14: qty 2

## 2016-06-14 MED ORDER — SODIUM CHLORIDE 0.9 % IJ SOLN
INTRAMUSCULAR | Status: AC
  Filled 2016-06-14: qty 50

## 2016-06-14 MED ORDER — ONDANSETRON HCL 4 MG PO TABS
4.0000 mg | ORAL_TABLET | Freq: Four times a day (QID) | ORAL | Status: DC | PRN
  Administered 2016-06-15: 4 mg via ORAL
  Filled 2016-06-14: qty 1

## 2016-06-14 MED ORDER — VANCOMYCIN HCL 500 MG IV SOLR
500.0000 mg | Freq: Once | INTRAVENOUS | Status: AC
  Administered 2016-06-14: 500 mg via INTRAVENOUS
  Filled 2016-06-14: qty 500

## 2016-06-14 MED ORDER — VANCOMYCIN HCL IN DEXTROSE 1-5 GM/200ML-% IV SOLN
1000.0000 mg | Freq: Two times a day (BID) | INTRAVENOUS | Status: DC
Start: 2016-06-14 — End: 2016-06-14
  Administered 2016-06-14: 1000 mg via INTRAVENOUS
  Filled 2016-06-14: qty 200

## 2016-06-14 MED ORDER — IOPAMIDOL (ISOVUE-300) INJECTION 61%
INTRAVENOUS | Status: AC
  Filled 2016-06-14: qty 100

## 2016-06-14 MED ORDER — INFLUENZA VAC SPLIT QUAD 0.5 ML IM SUSY
0.5000 mL | PREFILLED_SYRINGE | INTRAMUSCULAR | Status: AC
  Administered 2016-06-15: 0.5 mL via INTRAMUSCULAR

## 2016-06-14 MED ORDER — IOPAMIDOL (ISOVUE-300) INJECTION 61%
100.0000 mL | Freq: Once | INTRAVENOUS | Status: AC | PRN
  Administered 2016-06-14: 100 mL via INTRAVENOUS

## 2016-06-14 MED ORDER — HYDROCODONE-ACETAMINOPHEN 5-325 MG PO TABS
1.0000 | ORAL_TABLET | ORAL | Status: DC | PRN
  Administered 2016-06-14 – 2016-06-16 (×7): 2 via ORAL
  Administered 2016-06-16: 1 via ORAL
  Administered 2016-06-16: 2 via ORAL
  Filled 2016-06-14 (×8): qty 2
  Filled 2016-06-14: qty 1

## 2016-06-14 MED ORDER — ASPIRIN EC 81 MG PO TBEC
81.0000 mg | DELAYED_RELEASE_TABLET | Freq: Every day | ORAL | Status: DC
  Administered 2016-06-14 – 2016-06-16 (×3): 81 mg via ORAL
  Filled 2016-06-14 (×3): qty 1

## 2016-06-14 MED ORDER — ACETAMINOPHEN 650 MG RE SUPP: 650.0000 mg | Freq: Four times a day (QID) | RECTAL | Status: DC | PRN

## 2016-06-14 MED ORDER — VANCOMYCIN HCL 10 G IV SOLR
1500.0000 mg | Freq: Two times a day (BID) | INTRAVENOUS | Status: DC
  Administered 2016-06-15 – 2016-06-16 (×3): 1500 mg via INTRAVENOUS
  Filled 2016-06-14 (×4): qty 1500

## 2016-06-14 MED ORDER — SODIUM CHLORIDE 0.9% FLUSH
3.0000 mL | Freq: Two times a day (BID) | INTRAVENOUS | Status: DC
  Administered 2016-06-14 – 2016-06-16 (×4): 3 mL via INTRAVENOUS

## 2016-06-14 MED ORDER — SODIUM CHLORIDE 0.9 % IV SOLN
INTRAVENOUS | Status: AC
  Administered 2016-06-14: 02:00:00 via INTRAVENOUS

## 2016-06-14 MED ORDER — PERFLUTREN LIPID MICROSPHERE
1.0000 mL | INTRAVENOUS | Status: AC | PRN
  Administered 2016-06-14: 2 mL via INTRAVENOUS
  Filled 2016-06-14: qty 10

## 2016-06-14 MED ORDER — ACETAMINOPHEN 325 MG PO TABS: 650.0000 mg | ORAL_TABLET | Freq: Four times a day (QID) | ORAL | Status: DC | PRN
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
## 2016-06-14 NOTE — Progress Notes (Signed)
   06/14/2016   To Whom It May Concern,  Clayton Pena was admitted to [facility] on 06/13/2016 and remains hospitalized.  Please excuse him from court on 06/15/2016.  Sincerely,    Janece Canterbury, MD [facility] [address]   [address]  Ph:    [phone] Fax:  [phone]

## 2016-06-14 NOTE — Progress Notes (Signed)
Pharmacy: Re- vancomycin  Patient's a 55 y.o. M with vancomycin and primaxin started on 11/19 for suspected cellulitis.  Updated weight on 11/20 is 146 kg (standing weight).  Plan: - with updated weight, will change vancomycin dose to 1500 mg IV q12h - continue primaxin 1000 mg IV q8h - f/u renal function and clinical course  Dia Sitter, PharmD, BCPS 06/14/2016 1:31 PM

## 2016-06-14 NOTE — Progress Notes (Signed)
PHARMACIST - PHYSICIAN COMMUNICATION CONCERNING:  IV heparin  68 yoM on IV heparin for superificial vein thrombosis.  Please see note written earlier today by Dia Sitter, PharmD for more details.   Heparin level tonight = 0. 43, therapeutic (goal 0.3-0.7).  No issues with infusion or bleeding per RN.     RECOMMENDATION: Continue IV heparin at current rate 2000 units/hr.  Recheck in 6 hours.    Ralene Bathe, PharmD, BCPS 06/14/2016, 8:57 PM  Pager: [phone]

## 2016-06-14 NOTE — Progress Notes (Signed)
ANTICOAGULATION CONSULT NOTE -    Pharmacy Consult for Heparin Indication: r/o DVT  Allergies  Allergen Reactions  . Penicillins Anaphylaxis    Has patient had a PCN reaction causing immediate rash, facial/tongue/throat swelling, SOB or lightheadedness with hypotension: unknown Has patient had a PCN reaction causing severe rash involving mucus membranes or skin necrosis: unknown Has patient had a PCN reaction that required hospitalization: unknown Has patient had a PCN reaction occurring within the last 10 years: no If all of the above answers are "NO", then may proceed with Cephalosporin use.   . Demerol [Meperidine] Other (See Comments)    REACTION:  Increases blood pressure    Patient Measurements: Height: 6' (182.9 cm) Weight: (!) 321 lb 3.2 oz (145.7 kg) IBW/kg (Calculated) : 77.6 Heparin Dosing Weight: 105.7 kg  Vital Signs: Temp: 98.1 F (36.7 C) (11/20 0455) Temp Source: Oral (11/20 0455) BP: 148/90 (11/20 0455) Pulse Rate: 80 (11/20 0455)  Labs:  Recent Labs  06/13/16 2124 06/13/16 2130 06/13/16 2136 06/13/16 2152 06/14/16 0238 06/14/16 0441  HGB 15.2  --   --  16.0 13.4  --   HCT 44.8  --   --  47.0 39.4  --   PLT 243  --   --   --  195  --   APTT  --  28  --   --   --   --   LABPROT  --  12.0  --   --   --   --   INR  --  0.89  --   --   --   --   HEPARINUNFRC  --   --   --   --   --  0.36  CREATININE  --   --  1.00 1.00 0.88  --   TROPONINI  --   --   --   --  <0.03  --     Estimated Creatinine Clearance: 137.3 mL/min (by C-G formula based on SCr of 0.88 mg/dL).   Medical History: Past Medical History:  Diagnosis Date  . Angina at rest [facility])   . Coronary artery disease     Assessment:  55 yr male with PMH significant for DVT in 2011 and currently on no oral anticoagulation.  Presents to ED with left leg pain and redness.  Pt known to pharmacy from recent consulation for dosing of vancomycin.  CTAngio shows no evidence of PE  Pharmacy  consulted to begin IV heparin for treatment of r/o DVT  06/14/16 Heparin level = 0.36 with heparin infusing @ 1800 units/hr No complications of therapy noted  Goal of Therapy:  Heparin level 0.3-0.7 units/ml Monitor platelets by anticoagulation protocol: Yes   Plan:   Continue heparin infusion @ 1800 units/hr  Recheck heparin level in 6 hr to confirm therapeutic dose  Follow heparin level and CBC daily  Cheree Fowles, Toribio Harbour, PharmD 06/14/2016,5:25 AM

## 2016-06-14 NOTE — Progress Notes (Signed)
*  Preliminary Results* Left lower extremity venous duplex completed. Left lower extremity is negative for deep vein thrombosis. There is evidence of superficial vein thrombosis involving the left greater saphenous vein from groin to mid calf. There is no evidence of left Baker's cyst.  Preliminary results discussed with Catie, RN.  06/14/2016 10:05 AM  Maudry Mayhew, BS, RVT, RDCS, RDMS
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
## 2016-06-14 NOTE — Progress Notes (Signed)
ANTICOAGULATION CONSULT NOTE -    Pharmacy Consult for Heparin Indication: VTE  Allergies  Allergen Reactions  . Penicillins Anaphylaxis    Has patient had a PCN reaction causing immediate rash, facial/tongue/throat swelling, SOB or lightheadedness with hypotension: unknown Has patient had a PCN reaction causing severe rash involving mucus membranes or skin necrosis: unknown Has patient had a PCN reaction that required hospitalization: unknown Has patient had a PCN reaction occurring within the last 10 years: no If all of the above answers are "NO", then may proceed with Cephalosporin use.   . Demerol [Meperidine] Other (See Comments)    REACTION:  Increases blood pressure    Patient Measurements: Height: 6' (182.9 cm) Weight: (!) 321 lb 3.2 oz (145.7 kg) IBW/kg (Calculated) : 77.6 Heparin Dosing Weight: 105.7 kg  Vital Signs: Temp: 98.1 F (36.7 C) (11/20 0455) Temp Source: Oral (11/20 0455) BP: 148/90 (11/20 0455) Pulse Rate: 80 (11/20 0455)  Labs:  Recent Labs  06/13/16 2124 06/13/16 2130 06/13/16 2136 06/13/16 2152 06/14/16 0238 06/14/16 0441 06/14/16 0742 06/14/16 1055  HGB 15.2  --   --  16.0 13.4  --   --   --   HCT 44.8  --   --  47.0 39.4  --   --   --   PLT 243  --   --   --  195  --   --   --   APTT  --  28  --   --   --   --   --   --   LABPROT  --  12.0  --   --   --   --   --   --   INR  --  0.89  --   --   --   --   --   --   HEPARINUNFRC  --   --   --   --   --  0.36  --  0.29*  CREATININE  --   --  1.00 1.00 0.88  --   --   --   TROPONINI  --   --   --   --  <0.03  --  <0.03  --     Estimated Creatinine Clearance: 137.3 mL/min (by C-G formula based on SCr of 0.88 mg/dL).   Medical History: Past Medical History:  Diagnosis Date  . Angina at rest [facility])   . Coronary artery disease    ------------------------------------------------------------------------------- Assessment: 55 y.o. male with a past medical history significant for DVT and  MI (2011, on ASA 81 mg PTA), presented to the ED on 11/19 with complaints of left leg pain x 5 days, progressive redness, swelling and warmth of the leg with red streak going up to the groin. Reports no trauma to the leg and reports that on the night of 11/18, he had occasional sharp chest pain w/ some chest tightness. Heparin was started on admission to rule out DVT.  Chest CT showed no evidence of pulmonary embolus and doppler revealed superficial vein thrombosis of the left greater saphenous vein from the groin to mid calf w/ no evidence of left Baker's cyst. Per Dr. Sheran Fava, to continue heparin therapy for now d/t history of DVT and to consult [facility]VS for further recommendations.   Today, 06/14/16 - Heparin level at 0441 therapeutic at 0.36, but level at 1055 subtherapeutic at 0.29. - CBC stable - No bleeding noted - SCr stable.   Goal of Therapy:  Heparin level  0.3-0.7 units/ml Monitor platelets by anticoagulation protocol: Yes   Plan:   Increase rate to Heparin 2000 units/hr IV infusion.   Recheck heparin level in 6 hrs.  Monitor for signs and symptoms of bleeding.  Follow up w/ [facility]VS recommendations regarding anticoagulation.   Sallyanne Havers, PharmD Candidate 06/14/2016,12:36 PM

## 2016-06-15 LAB — CBC
HEMATOCRIT: 41.1 % (ref 39.0–52.0)
HEMOGLOBIN: 13.7 g/dL (ref 13.0–17.0)
MCH: 29.5 pg (ref 26.0–34.0)
MCHC: 33.3 g/dL (ref 30.0–36.0)
MCV: 88.6 fL (ref 78.0–100.0)
Platelets: 217 10*3/uL (ref 150–400)
RBC: 4.64 MIL/uL (ref 4.22–5.81)
RDW: 13.4 % (ref 11.5–15.5)
WBC: 14 10*3/uL — AB (ref 4.0–10.5)

## 2016-06-15 LAB — BASIC METABOLIC PANEL
ANION GAP: 7 (ref 5–15)
BUN: 9 mg/dL (ref 6–20)
CHLORIDE: 105 mmol/L (ref 101–111)
CO2: 25 mmol/L (ref 22–32)
Calcium: 8.3 mg/dL — ABNORMAL LOW (ref 8.9–10.3)
Creatinine, Ser: 0.93 mg/dL (ref 0.61–1.24)
GFR calc non Af Amer: >60 mL/min (ref >60)
GLUCOSE: 98 mg/dL (ref 65–99)
POTASSIUM: 3.8 mmol/L (ref 3.5–5.1)
Sodium: 137 mmol/L (ref 135–145)

## 2016-06-15 LAB — HEMOGLOBIN A1C
Hgb A1c MFr Bld: 5.6 % (ref 4.8–5.6)
Mean Plasma Glucose: 114 mg/dL

## 2016-06-15 LAB — HEPARIN LEVEL (UNFRACTIONATED): Heparin Unfractionated: 0.54 IU/mL (ref 0.30–0.70)

## 2016-06-15 MED ORDER — POTASSIUM CHLORIDE CRYS ER 20 MEQ PO TBCR
20.0000 meq | EXTENDED_RELEASE_TABLET | Freq: Every day | ORAL | Status: DC
  Administered 2016-06-15 – 2016-06-16 (×2): 20 meq via ORAL
  Filled 2016-06-15 (×2): qty 1

## 2016-06-15 MED ORDER — APIXABAN 5 MG PO TABS
10.0000 mg | ORAL_TABLET | Freq: Two times a day (BID) | ORAL | Status: DC
  Administered 2016-06-15 – 2016-06-16 (×3): 10 mg via ORAL
  Filled 2016-06-15 (×3): qty 2

## 2016-06-15 MED ORDER — IBUPROFEN 200 MG PO TABS: 400.0000 mg | ORAL_TABLET | ORAL | Status: DC | PRN

## 2016-06-15 MED ORDER — FUROSEMIDE 20 MG PO TABS
20.0000 mg | ORAL_TABLET | Freq: Every day | ORAL | Status: DC
  Administered 2016-06-15 – 2016-06-16 (×2): 20 mg via ORAL
  Filled 2016-06-15 (×2): qty 1

## 2016-06-15 MED ORDER — APIXABAN 5 MG PO TABS: 5.0000 mg | ORAL_TABLET | Freq: Two times a day (BID) | ORAL | Status: DC

## 2016-06-15 NOTE — Progress Notes (Signed)
PHARMACIST - PHYSICIAN COMMUNICATION CONCERNING:  IV heparin  28 yoM on IV heparin for superificial vein thrombosis.  Please see note written 11/20  by Dia Sitter, PharmD for more details.   Heparin level tonight = 0. 54, therapeutic x2  (goal 0.3-0.7).  No issues with infusion or bleeding per RN.     RECOMMENDATION: Continue IV heparin at current rate 2000 units/hr.   Daily CBC/HL  Dorrene German 06/15/2016, 3:11 AM

## 2016-06-15 NOTE — Discharge Instructions (Signed)
Information on my medicine - ELIQUIS (apixaban)  This medication education was reviewed with me or my healthcare representative as part of my discharge preparation.  The pharmacist that spoke with me during my hospital stay was:  Sallyanne Havers, Student-PharmD  Why was Eliquis prescribed for you? Eliquis was prescribed to treat blood clots that may have been found in the veins of your legs (deep vein thrombosis) or in your lungs (pulmonary embolism) and to reduce the risk of them occurring again.  What do You need to know about Eliquis ? The starting dose is 10 mg (two 5 mg tablets) taken TWICE daily for the FIRST SEVEN (7) DAYS, then on  Tuesday 06/22/16  the dose is reduced to ONE 5 mg tablet taken TWICE daily.  Eliquis may be taken with or without food.   Try to take the dose about the same time in the morning and in the evening. If you have difficulty swallowing the tablet whole please discuss with your pharmacist how to take the medication safely.  Take Eliquis exactly as prescribed and DO NOT stop taking Eliquis without talking to the doctor who prescribed the medication.  Stopping may increase your risk of developing a new blood clot.  Refill your prescription before you run out.  After discharge, you should have regular check-up appointments with your healthcare provider that is prescribing your Eliquis.    What do you do if you miss a dose? If a dose of ELIQUIS is not taken at the scheduled time, take it as soon as possible on the same day and twice-daily administration should be resumed. The dose should not be doubled to make up for a missed dose.  Important Safety Information A possible side effect of Eliquis is bleeding. You should call your healthcare provider right away if you experience any of the following: ? Bleeding from an injury or your nose that does not stop. ? Unusual colored urine (red or dark brown) or unusual colored stools (red or black). ? Unusual bruising  for unknown reasons. ? A serious fall or if you hit your head (even if there is no bleeding).  Some medicines may interact with Eliquis and might increase your risk of bleeding or clotting while on Eliquis. To help avoid this, consult your healthcare provider or pharmacist prior to using any new prescription or non-prescription medications, including herbals, vitamins, non-steroidal anti-inflammatory drugs (NSAIDs) and supplements.  This website has more information on Eliquis (apixaban): http://www.eliquis.com/eliquis/home

## 2016-06-15 NOTE — Progress Notes (Addendum)
Spoke with pt concerning discharge plan and PCP. Pt plan to discharge home with no needs at present time. Unable to send HHPT related to Medicaid guidelines. Pharm D gave Eliquis coupon to pt.
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
## 2016-06-15 NOTE — Progress Notes (Signed)
ANTICOAGULATION CONSULT NOTE -    Pharmacy Consult for Heparin>>Eliquis Indication: VTE  Allergies  Allergen Reactions  . Penicillins Anaphylaxis    Has patient had a PCN reaction causing immediate rash, facial/tongue/throat swelling, SOB or lightheadedness with hypotension: unknown Has patient had a PCN reaction causing severe rash involving mucus membranes or skin necrosis: unknown Has patient had a PCN reaction that required hospitalization: unknown Has patient had a PCN reaction occurring within the last 10 years: no If all of the above answers are "NO", then may proceed with Cephalosporin use.   . Demerol [Meperidine] Other (See Comments)    REACTION:  Increases blood pressure    Patient Measurements: Height: 6' (182.9 cm) Weight: (!) 322 lb 14.4 oz (146.5 kg) IBW/kg (Calculated) : 77.6 Heparin Dosing Weight: 105.7 kg  Vital Signs: Temp: 98.1 F (36.7 C) (11/21 0508) Temp Source: Oral (11/21 0508) BP: 143/78 (11/21 0508) Pulse Rate: 66 (11/21 0508)  Labs:  Recent Labs  06/13/16 2124 06/13/16 2130  06/13/16 2152 06/14/16 0238  06/14/16 0742 06/14/16 1055 06/14/16 1426 06/14/16 1942 06/15/16 0148  HGB 15.2  --   --  16.0 13.4  --   --   --   --   --  13.7  HCT 44.8  --   --  47.0 39.4  --   --   --   --   --  41.1  PLT 243  --   --   --  195  --   --   --   --   --  217  APTT  --  28  --   --   --   --   --   --   --   --   --   LABPROT  --  12.0  --   --   --   --   --   --   --   --   --   INR  --  0.89  --   --   --   --   --   --   --   --   --   HEPARINUNFRC  --   --   --   --   --   < >  --  0.29*  --  0.43 0.54  CREATININE  --   --   < > 1.00 0.88  --   --   --   --   --  0.93  TROPONINI  --   --   --   --  <0.03  --  <0.03  --  <0.03  --   --   < > = values in this interval not displayed.  Estimated Creatinine Clearance: 130.4 mL/min (by C-G formula based on SCr of 0.93 mg/dL).   Medical History: Past Medical History:  Diagnosis Date  . Angina  at rest [facility])   . Coronary artery disease    ------------------------------------------------------------------------------- Assessment: 55 y.o. male with a past medical history significant for DVT and MI (2011, on ASA 81 mg PTA), presented to the ED on 11/19 with complaints of left leg pain x 5 days, progressive redness, swelling and warmth of the leg with red streak going up to the groin. Reports no trauma to the leg and reports that on the night of 11/18, he had occasional sharp chest pain w/ some chest tightness. Heparin was started on admission to rule out DVT.  Chest CT showed no evidence of pulmonary  embolus and doppler revealed superficial vein thrombosis of the left greater saphenous vein from the groin to mid calf w/ no evidence of left Baker's cyst. Per Dr. Sheran Fava, to continue heparin therapy for now d/t history of DVT and to consult [facility] for further recommendations.   Today, 06/15/16 - Patient currently therapeutic on heparin 2000 units/hr. - Per VVS, due to extent of clot, patient will benefit from outpatient anticoagulation for 45 days. Per Dr. Sheran Fava, to discontinue heparin and start DVT treatment with Eliquis.  - CBC stable- platelets trending up - No bleeding noted - SCr stable at 0.93  Goal of Therapy:  Monitor platelets by anticoagulation protocol: Yes   Plan:   At the time of heparin discontinuation, immediately start Eliquis 10 mg po BID. Continue Eliquis 10 mg po BID for 7 days, then decrease dose to 5 mg BID for 38 days, or duration of treatment as specified.   Monitor for signs and symptoms of bleeding.    Sallyanne Havers, PharmD Candidate 06/15/2016,7:33 AM
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
## 2016-06-16 DIAGNOSIS — I809 Phlebitis and thrombophlebitis of unspecified site: Secondary | ICD-10-CM

## 2016-06-16 DIAGNOSIS — L03116 Cellulitis of left lower limb: Secondary | ICD-10-CM

## 2016-06-16 LAB — BASIC METABOLIC PANEL
ANION GAP: 5 (ref 5–15)
BUN: 10 mg/dL (ref 6–20)
CHLORIDE: 107 mmol/L (ref 101–111)
CO2: 26 mmol/L (ref 22–32)
Calcium: 8.6 mg/dL — ABNORMAL LOW (ref 8.9–10.3)
Creatinine, Ser: 0.98 mg/dL (ref 0.61–1.24)
GFR calc Af Amer: >60 mL/min (ref >60)
GFR calc non Af Amer: >60 mL/min (ref >60)
GLUCOSE: 98 mg/dL (ref 65–99)
POTASSIUM: 3.8 mmol/L (ref 3.5–5.1)
SODIUM: 138 mmol/L (ref 135–145)

## 2016-06-16 LAB — CBC
HCT: 40.7 % (ref 39.0–52.0)
HEMOGLOBIN: 13.4 g/dL (ref 13.0–17.0)
MCH: 29.2 pg (ref 26.0–34.0)
MCHC: 32.9 g/dL (ref 30.0–36.0)
MCV: 88.7 fL (ref 78.0–100.0)
Platelets: 212 10*3/uL (ref 150–400)
RBC: 4.59 MIL/uL (ref 4.22–5.81)
RDW: 13.2 % (ref 11.5–15.5)
WBC: 13.6 10*3/uL — AB (ref 4.0–10.5)

## 2016-06-16 MED ORDER — HYDROCODONE-ACETAMINOPHEN 5-325 MG PO TABS: 1.0000 | ORAL_TABLET | ORAL | 0 refills | Status: DC | PRN

## 2016-06-16 MED ORDER — APIXABAN 5 MG PO TABS: 5.0000 mg | ORAL_TABLET | Freq: Two times a day (BID) | ORAL | 0 refills | Status: DC

## 2016-06-16 MED ORDER — APIXABAN 5 MG PO TABS: 10.0000 mg | ORAL_TABLET | Freq: Two times a day (BID) | ORAL | 0 refills | Status: DC

## 2016-06-16 MED ORDER — LEVOFLOXACIN 750 MG PO TABS: 750.0000 mg | ORAL_TABLET | Freq: Every day | ORAL | 0 refills | Status: DC

## 2016-06-16 NOTE — Progress Notes (Signed)
Dear Doctor:  This patient has been identified as a candidate for PICC for the following reason (s): drug pH or osmolality (causing phlebitis, infiltration in 24 hours) If you agree, please write an order for the indicated device. For any questions contact the Vascular Access Team at [phone] if no answer, please leave a message.  Thank you for supporting the early vascular access assessment program. 

## 2016-06-16 NOTE — Progress Notes (Signed)
Patient discharged home with family, discharge instructions/prescrition given and explained to patient and he verbalized understanding. No wound noted, skin intact, redness from cellulitis on the upper lefteg/thigh. Accompanied home by family, transported to the car by staff.
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
## 2016-06-19 LAB — CULTURE, BLOOD (ROUTINE X 2)
CULTURE: NO GROWTH
Culture: NO GROWTH

## 2016-07-09 ENCOUNTER — Ambulatory Visit (INDEPENDENT_AMBULATORY_CARE_PROVIDER_SITE_OTHER): Payer: PRIVATE HEALTH INSURANCE | Admitting: Family Medicine

## 2016-07-09 ENCOUNTER — Encounter: Payer: Self-pay | Admitting: Family Medicine

## 2016-07-09 VITALS — BP 138/86 | HR 82 | Temp 98.6°F | Resp 18 | Ht 72.0 in | Wt 312.0 lb

## 2016-07-09 DIAGNOSIS — I809 Phlebitis and thrombophlebitis of unspecified site: Secondary | ICD-10-CM

## 2016-07-09 DIAGNOSIS — M79605 Pain in left leg: Secondary | ICD-10-CM | POA: Diagnosis not present

## 2016-07-09 DIAGNOSIS — I824Y2 Acute embolism and thrombosis of unspecified deep veins of left proximal lower extremity: Secondary | ICD-10-CM | POA: Insufficient documentation

## 2016-07-09 MED ORDER — ACETAMINOPHEN-CODEINE #3 300-30 MG PO TABS: 1.0000 | ORAL_TABLET | Freq: Four times a day (QID) | ORAL | 0 refills | Status: DC | PRN

## 2016-07-09 NOTE — Progress Notes (Signed)
Patient is to establish care  Patient complains of left leg pain being present.  Patient has taken medication today. Patient has eaten today.  Patient is aware of appointment for venous doppler being scheduled for Monday 07/12/16 at 9:00 am at [facility]
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
## 2016-07-09 NOTE — Patient Instructions (Addendum)
Phlebitis Phlebitis is soreness and puffiness (swelling) in a vein. Follow these instructions at home:  Only take medicine as told by your doctor.  Raise (elevate) the affected limb on a pillow as told by your doctor.  Keep a warm pack on the affected vein as told by your doctor. Do not sleep with a heating pad.  Use special stockings or bandages around the area of the affected vein as told by your doctor. These will speed healing and keep the condition from coming back.  Talk to your doctor about all the medicines you take.  Get follow-up blood tests as told by your doctor.  If the phlebitis is in your legs: ? Avoid standing or resting for long periods. ? Keep your legs moving. Raise your legs when you sit or lie.  Do not smoke.  Follow-up with your doctor as told. Contact a doctor if:  You have strange bruises or bleeding.  Your puffiness or pain in the affected area is not getting better.  You are taking medicine to lessen puffiness (anti-inflammatory medicine), and you get belly pain.  You have a fever. Get help right away if:  The phlebitis gets worse and you have more pain, puffiness (swelling), or redness.  You have trouble breathing or have chest pain. This information is not intended to replace advice given to you by your health care provider. Make sure you discuss any questions you have with your health care provider. Document Released: 06/30/2009 Document Revised: 12/18/2015 Document Reviewed: 03/19/2013 Elsevier Interactive Patient Education  2017 Elsevier Inc.  

## 2016-07-12 ENCOUNTER — Ambulatory Visit (HOSPITAL_COMMUNITY)
Admission: RE | Admit: 2016-07-12 | Discharge: 2016-07-12 | Disposition: A | Discharge status: Home / Self Care | Payer: Medicaid Other | Source: Ambulatory Visit | Attending: Family Medicine | Admitting: Family Medicine

## 2016-07-12 DIAGNOSIS — M79605 Pain in left leg: Secondary | ICD-10-CM

## 2016-07-12 DIAGNOSIS — I809 Phlebitis and thrombophlebitis of unspecified site: Secondary | ICD-10-CM | POA: Diagnosis not present

## 2016-07-12 DIAGNOSIS — I824Y2 Acute embolism and thrombosis of unspecified deep veins of left proximal lower extremity: Secondary | ICD-10-CM

## 2016-07-12 DIAGNOSIS — I471 Supraventricular tachycardia: Secondary | ICD-10-CM | POA: Diagnosis not present

## 2016-07-12 NOTE — Progress Notes (Signed)
*  Preliminary Results* Bilateral lower extremity venous duplex completed. Bilateral lower extremities are negative for deep vein thrombosis. The left greater saphenous vein exhibits subacute thrombus involving the mid thigh segment only. Compared to prior study from 06/14/16, this appears to be resolving. There is no evidence of Baker's cyst bilaterally.  Preliminary results discussed with Mickel Baas of Dr. Smith Robert' office.  07/12/2016 9:48 AM Maudry Mayhew, BS, RVT, RDCS, RDMS

## 2016-07-30 ENCOUNTER — Telehealth: Payer: Self-pay

## 2016-07-30 MED ORDER — APIXABAN 5 MG PO TABS: 5.0000 mg | ORAL_TABLET | Freq: Two times a day (BID) | ORAL | 0 refills | Status: DC

## 2016-07-30 NOTE — Telephone Encounter (Signed)
Refilled to pharmacy.

## 2016-09-06 ENCOUNTER — Telehealth: Payer: Self-pay

## 2016-09-06 MED ORDER — APIXABAN 5 MG PO TABS: 5.0000 mg | ORAL_TABLET | Freq: Two times a day (BID) | ORAL | 0 refills | Status: DC

## 2016-09-06 NOTE — Telephone Encounter (Signed)
This has been refilled.  Thanks. 

## 2016-09-21 ENCOUNTER — Encounter (INDEPENDENT_AMBULATORY_CARE_PROVIDER_SITE_OTHER): Payer: Self-pay | Admitting: Orthopaedic Surgery

## 2016-09-21 ENCOUNTER — Ambulatory Visit (INDEPENDENT_AMBULATORY_CARE_PROVIDER_SITE_OTHER): Payer: Self-pay

## 2016-09-21 ENCOUNTER — Ambulatory Visit (INDEPENDENT_AMBULATORY_CARE_PROVIDER_SITE_OTHER): Payer: Worker's Compensation | Admitting: Orthopaedic Surgery

## 2016-09-21 VITALS — BP 155/103 | HR 95 | Ht 72.0 in | Wt 310.0 lb

## 2016-09-21 DIAGNOSIS — M25562 Pain in left knee: Secondary | ICD-10-CM

## 2016-09-21 DIAGNOSIS — G8929 Other chronic pain: Secondary | ICD-10-CM | POA: Diagnosis not present

## 2016-09-21 DIAGNOSIS — M25561 Pain in right knee: Secondary | ICD-10-CM

## 2016-09-21 DIAGNOSIS — R200 Anesthesia of skin: Secondary | ICD-10-CM
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
## 2016-10-20 ENCOUNTER — Other Ambulatory Visit: Payer: Self-pay | Admitting: Family Medicine

## 2016-10-20 ENCOUNTER — Telehealth: Payer: Self-pay

## 2016-10-20 DIAGNOSIS — I824Y2 Acute embolism and thrombosis of unspecified deep veins of left proximal lower extremity: Secondary | ICD-10-CM

## 2016-10-20 MED ORDER — APIXABAN 5 MG PO TABS: 5.0000 mg | ORAL_TABLET | Freq: Two times a day (BID) | ORAL | 0 refills | Status: DC

## 2016-11-26 ENCOUNTER — Telehealth: Payer: Self-pay

## 2016-11-26 NOTE — Telephone Encounter (Signed)
Patient needs an appointment. Please schedule. Thanks!

## 2016-12-07 ENCOUNTER — Encounter: Payer: Self-pay | Admitting: Family Medicine

## 2016-12-07 ENCOUNTER — Ambulatory Visit (INDEPENDENT_AMBULATORY_CARE_PROVIDER_SITE_OTHER): Payer: Medicaid Other | Admitting: Family Medicine

## 2016-12-07 DIAGNOSIS — I10 Essential (primary) hypertension: Secondary | ICD-10-CM | POA: Diagnosis not present

## 2016-12-07 DIAGNOSIS — M79605 Pain in left leg: Secondary | ICD-10-CM

## 2016-12-07 DIAGNOSIS — I824Y2 Acute embolism and thrombosis of unspecified deep veins of left proximal lower extremity: Secondary | ICD-10-CM

## 2016-12-07 LAB — COMPLETE METABOLIC PANEL WITH GFR
ALT: 32 U/L (ref 9–46)
AST: 18 U/L (ref 10–35)
Albumin: 4.1 g/dL (ref 3.6–5.1)
Alkaline Phosphatase: 40 U/L (ref 40–115)
BILIRUBIN TOTAL: 0.5 mg/dL (ref 0.2–1.2)
BUN: 16 mg/dL (ref 7–25)
CALCIUM: 9.1 mg/dL (ref 8.6–10.3)
CO2: 21 mmol/L (ref 20–31)
CREATININE: 0.82 mg/dL (ref 0.70–1.33)
Chloride: 105 mmol/L (ref 98–110)
GFR, Est Non African American: >89 mL/min (ref >=60)
Glucose, Bld: 100 mg/dL — ABNORMAL HIGH (ref 65–99)
Potassium: 4.5 mmol/L (ref 3.5–5.3)
Sodium: 136 mmol/L (ref 135–146)
TOTAL PROTEIN: 7 g/dL (ref 6.1–8.1)

## 2016-12-07 LAB — POCT URINALYSIS DIP (DEVICE)
Bilirubin Urine: NEGATIVE
GLUCOSE, UA: NEGATIVE mg/dL
Hgb urine dipstick: NEGATIVE
Ketones, ur: NEGATIVE mg/dL
LEUKOCYTES UA: NEGATIVE
NITRITE: NEGATIVE
PROTEIN: NEGATIVE mg/dL
Specific Gravity, Urine: 1.02 (ref 1.005–1.030)
UROBILINOGEN UA: 0.2 mg/dL (ref 0.0–1.0)
pH: 5.5 (ref 5.0–8.0)

## 2016-12-07 LAB — POCT GLYCOSYLATED HEMOGLOBIN (HGB A1C): Hemoglobin A1C: 5.4

## 2016-12-07 LAB — TSH: TSH: 1.71 m[IU]/L (ref 0.40–4.50)

## 2016-12-07 MED ORDER — APIXABAN 5 MG PO TABS
5.0000 mg | ORAL_TABLET | Freq: Two times a day (BID) | ORAL | 5 refills | Status: DC
Start: 2016-12-07 — End: 2017-01-11

## 2016-12-07 MED ORDER — AMLODIPINE BESYLATE 5 MG PO TABS: 5.0000 mg | ORAL_TABLET | Freq: Every day | ORAL | 0 refills | Status: DC

## 2016-12-07 MED ORDER — ACETAMINOPHEN-CODEINE #3 300-30 MG PO TABS: 1.0000 | ORAL_TABLET | Freq: Four times a day (QID) | ORAL | 0 refills | Status: DC | PRN

## 2016-12-07 MED ORDER — MEDICAL COMPRESSION STOCKINGS MISC: 1.0000 | Freq: Every day | 2 refills | Status: DC
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
## 2016-12-07 NOTE — Patient Instructions (Addendum)
RLE DVT: Will continue Eliquis 5 mg twice daily Recommend compression stockings daily 30 to 40 mm HG   Right knee pain:  Tylenol # 3 every 6 hours as needed for moderate to severe pain.  Will order

## 2016-12-08 ENCOUNTER — Telehealth: Payer: Self-pay

## 2016-12-08 LAB — CBC WITH DIFFERENTIAL/PLATELET
BASOS ABS: 0 {cells}/uL (ref 0–200)
Basophils Relative: 0 %
EOS PCT: 0 %
Eosinophils Absolute: 0 cells/uL — ABNORMAL LOW (ref 15–500)
HEMATOCRIT: 48.4 % (ref 38.5–50.0)
Hemoglobin: 15.6 g/dL (ref 13.2–17.1)
LYMPHS PCT: 17 %
Lymphs Abs: 1751 cells/uL (ref 850–3900)
MCH: 29.2 pg (ref 27.0–33.0)
MCHC: 32.2 g/dL (ref 32.0–36.0)
MCV: 90.6 fL (ref 80.0–100.0)
MPV: 10.3 fL (ref 7.5–12.5)
Monocytes Absolute: 824 cells/uL (ref 200–950)
Monocytes Relative: 8 %
NEUTROS PCT: 75 %
Neutro Abs: 7725 cells/uL (ref 1500–7800)
Platelets: 254 10*3/uL (ref 140–400)
RBC: 5.34 MIL/uL (ref 4.20–5.80)
RDW: 13.6 % (ref 11.0–15.0)
WBC: 10.3 10*3/uL (ref 3.8–10.8)

## 2016-12-08 NOTE — Telephone Encounter (Signed)
Called and advised patient of normal labs and to eat low fat/ low carb diet over 5 to 6 small meals daily, drink 6 to 8 glasses of water daily, increase exercise, and keep next scheduled appointment for 1 month. Patient verbalized understanding. Thanks!

## 2016-12-08 NOTE — Telephone Encounter (Signed)
-----   Message from Dorena Dew, [place] sent at 12/08/2016  8:22 AM EDT ----- Regarding: lab results Please inform Mr. Guettler that all laboratory values were within a normal range. Recommend a lowfat, low carbohydrate diet divided over 5-6 small meals, increase water intake to 6-8 glasses, and increase daily activity. Will follow up in 1 month as scheduled.   Thanks ----- Message ----- From: Adelina Mings, LPN Sent: 02/20/210   9:41 AM To: Dorena Dew, FNP

## 2016-12-21 ENCOUNTER — Ambulatory Visit (INDEPENDENT_AMBULATORY_CARE_PROVIDER_SITE_OTHER): Payer: Self-pay | Admitting: Orthopaedic Surgery

## 2017-01-11 ENCOUNTER — Ambulatory Visit (INDEPENDENT_AMBULATORY_CARE_PROVIDER_SITE_OTHER): Payer: Medicaid Other | Admitting: Family Medicine

## 2017-01-11 ENCOUNTER — Encounter: Payer: Self-pay | Admitting: Family Medicine

## 2017-01-11 ENCOUNTER — Other Ambulatory Visit: Payer: Self-pay

## 2017-01-11 VITALS — BP 140/84 | HR 80 | Temp 97.6°F | Resp 18 | Ht 72.0 in | Wt 309.0 lb

## 2017-01-11 DIAGNOSIS — I10 Essential (primary) hypertension: Secondary | ICD-10-CM

## 2017-01-11 DIAGNOSIS — I824Y2 Acute embolism and thrombosis of unspecified deep veins of left proximal lower extremity: Secondary | ICD-10-CM | POA: Diagnosis not present

## 2017-01-11 DIAGNOSIS — K429 Umbilical hernia without obstruction or gangrene: Secondary | ICD-10-CM

## 2017-01-11 LAB — POCT URINALYSIS DIP (DEVICE)
Bilirubin Urine: NEGATIVE
Glucose, UA: NEGATIVE mg/dL
HGB URINE DIPSTICK: NEGATIVE
KETONES UR: NEGATIVE mg/dL
Leukocytes, UA: NEGATIVE
Nitrite: NEGATIVE
PH: 5 (ref 5.0–8.0)
PROTEIN: NEGATIVE mg/dL
SPECIFIC GRAVITY, URINE: 1.02 (ref 1.005–1.030)
Urobilinogen, UA: 0.2 mg/dL (ref 0.0–1.0)

## 2017-01-11 MED ORDER — APIXABAN 5 MG PO TABS: 5.0000 mg | ORAL_TABLET | Freq: Two times a day (BID) | ORAL | 1 refills | Status: DC

## 2017-01-11 MED ORDER — HYDROCHLOROTHIAZIDE 12.5 MG PO TABS: 12.5000 mg | ORAL_TABLET | Freq: Every day | ORAL | 3 refills | Status: DC
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
## 2017-01-11 NOTE — Telephone Encounter (Signed)
Sent medications into corrected pharmacy. Thanks!

## 2017-01-11 NOTE — Patient Instructions (Signed)
Essential hypertension:  Will discontinue amlodipine due to swelling of lower extremities and fluid retention Will start a trial of hydrocholorothiazide, which is a thiazide diuretic used to lower blood pressure. Recommend that you take medication in am due to increased urination  The patient is asked to make an attempt to improve diet and exercise patterns to aid in medical management of this problem.   DVT:  Continue Eliquis twice daily as prescribed   Umbilical hernia:  Will obtain imaging for worsening umbilical hernia. Will follow up after reviewing image.

## 2017-01-12 MED ORDER — APIXABAN 5 MG PO TABS: 5.0000 mg | ORAL_TABLET | Freq: Two times a day (BID) | ORAL | 1 refills | Status: DC

## 2017-01-12 MED ORDER — HYDROCHLOROTHIAZIDE 12.5 MG PO TABS: 12.5000 mg | ORAL_TABLET | Freq: Every day | ORAL | 3 refills | Status: DC

## 2017-01-14 ENCOUNTER — Ambulatory Visit (HOSPITAL_COMMUNITY)
Admission: RE | Admit: 2017-01-14 | Discharge: 2017-01-14 | Disposition: A | Discharge status: Home / Self Care | Payer: Medicaid Other | Source: Ambulatory Visit | Attending: Family Medicine | Admitting: Family Medicine

## 2017-01-14 ENCOUNTER — Other Ambulatory Visit: Payer: Self-pay | Admitting: Family Medicine

## 2017-01-14 DIAGNOSIS — K429 Umbilical hernia without obstruction or gangrene: Secondary | ICD-10-CM | POA: Diagnosis not present

## 2017-01-17 ENCOUNTER — Telehealth: Payer: Self-pay | Admitting: Family Medicine

## 2017-01-17 DIAGNOSIS — K429 Umbilical hernia without obstruction or gangrene: Secondary | ICD-10-CM

## 2017-01-17 NOTE — Telephone Encounter (Signed)
[name], a 56 year old male presented on 6/19 complaining of a umbilical hernia that is increasing in size and tender to touch. Patient was sent for an abdominal ultrasound, which yielded the following results:  FINDINGS: There is again noted umbilical hernia containing only omental fat similar to that seen on prior CT examination. The fascial defect measures approximately 2.1 cm by 2.8 cm. This is similar to that seen on the prior CT examination although the defect appears slightly larger. A second smaller area of herniation is noted just superior similar to that noted on the prior CT examination of but also slightly larger. The focal fashion defect related to the smaller more superior collection is not well appreciated on this exam.  IMPRESSION: Two hernias through the anterior abdominal wall adjacent to the umbilicus increased in size from the prior CT examination.   Donia Pounds  MSN, FNP-C [place] [address] [place], [place] [address] [phone]

## 2017-01-18 ENCOUNTER — Ambulatory Visit (INDEPENDENT_AMBULATORY_CARE_PROVIDER_SITE_OTHER): Payer: Worker's Compensation | Admitting: Orthopaedic Surgery

## 2017-01-18 ENCOUNTER — Encounter (INDEPENDENT_AMBULATORY_CARE_PROVIDER_SITE_OTHER): Payer: Self-pay | Admitting: Orthopaedic Surgery

## 2017-01-18 VITALS — BP 150/96 | HR 68 | Ht 72.0 in | Wt 304.0 lb

## 2017-01-18 DIAGNOSIS — M170 Bilateral primary osteoarthritis of knee: Secondary | ICD-10-CM

## 2017-01-18 DIAGNOSIS — I824Y2 Acute embolism and thrombosis of unspecified deep veins of left proximal lower extremity: Secondary | ICD-10-CM | POA: Diagnosis not present
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
## 2017-01-31 ENCOUNTER — Ambulatory Visit: Payer: Medicaid Other

## 2017-01-31 DIAGNOSIS — Z0130 Encounter for examination of blood pressure without abnormal findings: Secondary | ICD-10-CM

## 2017-02-02 ENCOUNTER — Telehealth (INDEPENDENT_AMBULATORY_CARE_PROVIDER_SITE_OTHER): Payer: Self-pay

## 2017-02-02 NOTE — Telephone Encounter (Signed)
Faxed 01/18/17 office note to adj per her request

## 2017-02-08 DIAGNOSIS — Z0130 Encounter for examination of blood pressure without abnormal findings: Secondary | ICD-10-CM | POA: Insufficient documentation

## 2017-02-10 DIAGNOSIS — I2510 Atherosclerotic heart disease of native coronary artery without angina pectoris: Secondary | ICD-10-CM | POA: Diagnosis not present

## 2017-02-10 DIAGNOSIS — K420 Umbilical hernia with obstruction, without gangrene: Secondary | ICD-10-CM | POA: Diagnosis not present

## 2017-02-10 DIAGNOSIS — I82409 Acute embolism and thrombosis of unspecified deep veins of unspecified lower extremity: Secondary | ICD-10-CM | POA: Diagnosis not present

## 2017-02-10 DIAGNOSIS — M6208 Separation of muscle (nontraumatic), other site: Secondary | ICD-10-CM | POA: Diagnosis not present

## 2017-02-10 DIAGNOSIS — Z7901 Long term (current) use of anticoagulants: Secondary | ICD-10-CM | POA: Diagnosis not present

## 2017-02-11 ENCOUNTER — Ambulatory Visit: Payer: Self-pay | Admitting: Family Medicine

## 2017-03-10 ENCOUNTER — Telehealth (INDEPENDENT_AMBULATORY_CARE_PROVIDER_SITE_OTHER): Payer: Self-pay | Admitting: Orthopaedic Surgery

## 2017-03-10 NOTE — Telephone Encounter (Signed)
01/18/2017 OV NOTE FAXED TO JASMINE @ [facility]

## 2017-04-20 ENCOUNTER — Ambulatory Visit (INDEPENDENT_AMBULATORY_CARE_PROVIDER_SITE_OTHER): Payer: Self-pay | Admitting: Orthopaedic Surgery

## 2017-04-30 ENCOUNTER — Encounter (HOSPITAL_COMMUNITY): Payer: Self-pay | Admitting: Emergency Medicine

## 2017-04-30 ENCOUNTER — Emergency Department (HOSPITAL_BASED_OUTPATIENT_CLINIC_OR_DEPARTMENT_OTHER)
Admission: EM | Admit: 2017-04-30 | Discharge: 2017-04-30 | Disposition: A | Discharge status: Home / Self Care | Payer: Medicaid Other | Source: Home / Self Care | Attending: Emergency Medicine | Admitting: Emergency Medicine

## 2017-04-30 ENCOUNTER — Emergency Department (HOSPITAL_COMMUNITY)
Admission: EM | Admit: 2017-04-30 | Discharge: 2017-04-30 | Disposition: A | Discharge status: Home / Self Care | Payer: Medicaid Other | Attending: Emergency Medicine | Admitting: Emergency Medicine

## 2017-04-30 DIAGNOSIS — Z7901 Long term (current) use of anticoagulants: Secondary | ICD-10-CM | POA: Diagnosis not present

## 2017-04-30 DIAGNOSIS — M79609 Pain in unspecified limb: Secondary | ICD-10-CM

## 2017-04-30 DIAGNOSIS — I8001 Phlebitis and thrombophlebitis of superficial vessels of right lower extremity: Secondary | ICD-10-CM | POA: Insufficient documentation

## 2017-04-30 DIAGNOSIS — Z7982 Long term (current) use of aspirin: Secondary | ICD-10-CM | POA: Diagnosis not present

## 2017-04-30 DIAGNOSIS — R2241 Localized swelling, mass and lump, right lower limb: Secondary | ICD-10-CM | POA: Diagnosis present

## 2017-04-30 DIAGNOSIS — Z79899 Other long term (current) drug therapy: Secondary | ICD-10-CM | POA: Diagnosis not present

## 2017-04-30 DIAGNOSIS — I2510 Atherosclerotic heart disease of native coronary artery without angina pectoris: Secondary | ICD-10-CM | POA: Insufficient documentation

## 2017-04-30 LAB — BASIC METABOLIC PANEL
Anion gap: 10 (ref 5–15)
BUN: 17 mg/dL (ref 6–20)
CHLORIDE: 104 mmol/L (ref 101–111)
CO2: 21 mmol/L — ABNORMAL LOW (ref 22–32)
Calcium: 8.7 mg/dL — ABNORMAL LOW (ref 8.9–10.3)
Creatinine, Ser: 0.87 mg/dL (ref 0.61–1.24)
GFR calc Af Amer: >60 mL/min (ref >60)
GFR calc non Af Amer: >60 mL/min (ref >60)
GLUCOSE: 114 mg/dL — AB (ref 65–99)
POTASSIUM: 4.3 mmol/L (ref 3.5–5.1)
Sodium: 135 mmol/L (ref 135–145)

## 2017-04-30 LAB — CBC WITH DIFFERENTIAL/PLATELET
Basophils Absolute: 0 10*3/uL (ref 0.0–0.1)
Basophils Relative: 0 %
EOS PCT: 3 %
Eosinophils Absolute: 0.4 10*3/uL (ref 0.0–0.7)
HEMATOCRIT: 45.1 % (ref 39.0–52.0)
Hemoglobin: 15.5 g/dL (ref 13.0–17.0)
LYMPHS ABS: 2 10*3/uL (ref 0.7–4.0)
LYMPHS PCT: 15 %
MCH: 29.9 pg (ref 26.0–34.0)
MCHC: 34.4 g/dL (ref 30.0–36.0)
MCV: 86.9 fL (ref 78.0–100.0)
MONO ABS: 1 10*3/uL (ref 0.1–1.0)
Monocytes Relative: 8 %
Neutro Abs: 9.8 10*3/uL — ABNORMAL HIGH (ref 1.7–7.7)
Neutrophils Relative %: 74 %
PLATELETS: 230 10*3/uL (ref 150–400)
RBC: 5.19 MIL/uL (ref 4.22–5.81)
RDW: 12.9 % (ref 11.5–15.5)
WBC: 13.3 10*3/uL — ABNORMAL HIGH (ref 4.0–10.5)

## 2017-04-30 MED ORDER — APIXABAN 5 MG PO TABS: 10.0000 mg | ORAL_TABLET | Freq: Two times a day (BID) | ORAL | 0 refills | Status: DC

## 2017-04-30 MED ORDER — DOXYCYCLINE HYCLATE 100 MG PO CAPS: 100.0000 mg | ORAL_CAPSULE | Freq: Two times a day (BID) | ORAL | 0 refills | Status: DC

## 2017-04-30 NOTE — Progress Notes (Signed)
**  Preliminary report by tech**  Right lower extremity venous duplex complete. There is no evidence of deep vein thrombosis involving the right lower extremity. There is evidence of a thrombosed varicose vein in the anterior, mid right thigh. There is no evidence of a Baker's cyst on the right. Results were given to Dr. Tamera Punt.  04/30/17 11:22 AM Clayton Pena RVT

## 2017-04-30 NOTE — Discharge Instructions (Signed)
Take the 10mg  of Eliquis twice a day for one week, then go back to taking your regular dose of 5mg  twice daily.  Take all of the antibiotics as prescribed.  Make an appointment to follow up with your doctor within the next week.  Use warm compresses to the area.  Return here for any worsening symptoms.

## 2017-04-30 NOTE — ED Provider Notes (Signed)
[facility] DEPT Provider Note   CSN: [id number] Arrival date & time: 04/30/17  0857     History   Chief Complaint Chief Complaint  Patient presents with  . Leg Swelling    HPI Clayton Pena is a 56 y.o. male.  Patient presents with leg swelling. 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 He most recently had a DVT in his left lower leg. He has been on Eliquis but stopped this about 6 weeks ago after about 10 months of taking it.  He states a few days ago he noted some swelling and soreness in his right thigh. He now has noticed that it's red and tender to touch. He feels like it's another DVT. He's had similar symptoms in the past. He denies any chest pain or shortness of breath. No fevers. No swelling to his lower leg.      Past Medical History:  Diagnosis Date  . Angina at rest [facility])   . Coronary artery disease     Patient Active Problem List   Diagnosis Date Noted  . Blood pressure check 02/08/2017  . Morbid obesity ([place]) 01/12/2017  . Acute venous embolism and thrombosis of deep vessels of proximal end of left lower extremity ([place]) 07/09/2016  . CAD (coronary artery disease) 06/14/2016  . Cellulitis 06/14/2016  . Phlebitis 06/13/2016    Past Surgical History:  Procedure Laterality Date  . BACK SURGERY    . CORONARY ANGIOPLASTY         Home Medications    Prior to Admission medications   Medication Sig Start Date End Date Taking? Authorizing Provider  aspirin EC 81 MG tablet Take 81 mg by mouth daily.   Yes [provider]  hydrochlorothiazide (HYDRODIURIL) 12.5 MG tablet Take 1 tablet (12.5 mg total) by mouth daily. 01/12/17  Yes Dorena Dew, FNP  acetaminophen-codeine (TYLENOL #3) 300-30 MG tablet Take 1 tablet by mouth every 6 (six) hours as needed for moderate pain. Patient not taking: Reported on 04/30/2017 12/07/16   Dorena Dew, FNP  apixaban (ELIQUIS) 5 MG TABS tablet Take 1 tablet (5 mg total) by mouth 2 (two) times  daily. Patient not taking: Reported on 04/30/2017 01/12/17   Dorena Dew, FNP  apixaban (ELIQUIS) 5 MG TABS tablet Take 2 tablets (10 mg total) by mouth 2 (two) times daily. For 7 days, then start your other prescription for 5mg  (one tablet) twice daily 04/30/17   Malvin Johns, MD  doxycycline (VIBRAMYCIN) 100 MG capsule Take 1 capsule (100 mg total) by mouth 2 (two) times daily. One po bid x 7 days 04/30/17   Malvin Johns, MD  Elastic Bandages & Supports (MEDICAL COMPRESSION STOCKINGS) MISC 1 each by Does not apply route daily. 12/07/16   Dorena Dew, FNP    Family History Family History  Problem Relation Age of Onset  . CAD Mother   . Diabetes Mother   . CAD Father   . Prostate cancer Father   . Diabetes Brother   . Stroke Neg Hx     Social History Social History  Substance Use Topics  . Smoking status: Never Smoker  . Smokeless tobacco: Never Used  . Alcohol use No     Comment: occasional     Allergies   Penicillins and Demerol [meperidine]   Review of Systems Review of Systems  Constitutional: Negative for chills, diaphoresis, fatigue and fever.  HENT: Negative for congestion, rhinorrhea and sneezing.   Eyes: Negative.  Respiratory: Negative for cough, chest tightness and shortness of breath.   Cardiovascular: Positive for leg swelling. Negative for chest pain.  Gastrointestinal: Negative for abdominal pain, blood in stool, diarrhea, nausea and vomiting.  Genitourinary: Negative for difficulty urinating, flank pain, frequency and hematuria.  Musculoskeletal: Negative for arthralgias and back pain.  Skin: Positive for color change. Negative for rash.  Neurological: Negative for dizziness, speech difficulty, weakness, numbness and headaches.     Physical Exam Updated Vital Signs BP (!) 155/95 (BP Location: Left Arm)   Pulse 86   Temp 98.2 F (36.8 C) (Oral)   Resp (!) 22   SpO2 96%   Physical Exam  Constitutional: He is oriented to person, place,  and time. He appears well-developed and well-nourished.  HENT:  Head: Normocephalic and atraumatic.  Eyes: Pupils are equal, round, and reactive to light.  Neck: Normal range of motion. Neck supple.  Cardiovascular: Normal rate, regular rhythm and normal heart sounds.   Pulmonary/Chest: Effort normal and breath sounds normal. No respiratory distress. He has no wheezes. He has no rales. He exhibits no tenderness.  Abdominal: Soft. Bowel sounds are normal. There is no tenderness. There is no rebound and no guarding.  Musculoskeletal: Normal range of motion. He exhibits no edema.  PT has some mild localized swelling to his right mid thigh. There is some firmness to the touch and localized erythema. There is no streaking. No swelling of the lower leg.  Pedal pulses are intact. He has normal sensation and motor function in the leg.  Lymphadenopathy:    He has no cervical adenopathy.  Neurological: He is alert and oriented to person, place, and time.  Skin: Skin is warm and dry. No rash noted.  Psychiatric: He has a normal mood and affect.     ED Treatments / Results  Labs (all labs ordered are listed, but only abnormal results are displayed) Labs Reviewed  BASIC METABOLIC PANEL - Abnormal; Notable for the following:       Result Value   CO2 21 (*)    Glucose, Bld 114 (*)    Calcium 8.7 (*)    All other components within normal limits  CBC WITH DIFFERENTIAL/PLATELET - Abnormal; Notable for the following:    WBC 13.3 (*)    Neutro Abs 9.8 (*)    All other components within normal limits    EKG  EKG Interpretation None       Radiology No results found.  Procedures Procedures (including critical care time)  Medications Ordered in ED Medications - No data to display   Initial Impression / Assessment and Plan / ED Course  I have reviewed the triage vital signs and the nursing notes.  Pertinent labs & imaging results that were available during my care of the patient were  reviewed by me and considered in my medical decision making (see chart for details).     Patient is a 58 year oldmale who presents with an area of pain and swelling to his right thigh. He's had a prior history of DVT as well as a more recent admission for septic thrombophlebitis. Ultrasound today shows an area of superficial thrombophlebitis without evidence of DVT. However given his prior incidence of DVT, I will go ahead and start him on pelvic rest. He has his prescriptions still for the 5 mg Eliquis  twice a day. I did advise him to take 10 mg twice a day for the first 7 days and I gave him a prescription for this.Following  completion of the seven-day course he can go back to taking his regular Eliquis 5 mg twice a day. He has been off eloquent is for about 6 weeks.  I also will start him on doxycycline and advised him to do warm compresses to the area. Return precautions were given.  He is advised to make a follow-up appointment with his primary care physician within the next week.  Final Clinical Impressions(s) / ED Diagnoses   Final diagnoses:  Thrombophlebitis of superficial veins of right lower extremity    New Prescriptions New Prescriptions   APIXABAN (ELIQUIS) 5 MG TABS TABLET    Take 2 tablets (10 mg total) by mouth 2 (two) times daily. For 7 days, then start your other prescription for 5mg  (one tablet) twice daily   DOXYCYCLINE (VIBRAMYCIN) 100 MG CAPSULE    Take 1 capsule (100 mg total) by mouth 2 (two) times daily. One po bid x 7 days     Malvin Johns, MD 04/30/17 1130

## 2017-04-30 NOTE — ED Triage Notes (Signed)
Pt reports leg pain and swelling for the past 5 days. Worse this am. Hx of blood clots, feels like same.

## 2017-05-10 ENCOUNTER — Telehealth (INDEPENDENT_AMBULATORY_CARE_PROVIDER_SITE_OTHER): Payer: Self-pay | Admitting: Orthopaedic Surgery

## 2017-05-10 NOTE — Telephone Encounter (Signed)
Received VM from [facility] @ [facility] wanting to check status of request for record. Request has not been received. No return number left, unable to call back

## 2017-05-20 ENCOUNTER — Telehealth (INDEPENDENT_AMBULATORY_CARE_PROVIDER_SITE_OTHER): Payer: Self-pay | Admitting: Orthopaedic Surgery

## 2017-05-20 NOTE — Telephone Encounter (Signed)
Spoke with Jamie @ [facility]. Told her we did not have their request for records. Gave her our land line fax and asked her to re fax and I will fax records by Monday

## 2017-05-25 ENCOUNTER — Encounter: Payer: Self-pay | Admitting: Family Medicine

## 2017-05-25 ENCOUNTER — Ambulatory Visit (INDEPENDENT_AMBULATORY_CARE_PROVIDER_SITE_OTHER): Payer: Medicaid Other | Admitting: Family Medicine

## 2017-05-25 VITALS — BP 142/84 | HR 73 | Temp 97.9°F | Resp 18 | Ht 72.0 in | Wt 322.0 lb

## 2017-05-25 DIAGNOSIS — K5909 Other constipation: Secondary | ICD-10-CM | POA: Diagnosis not present

## 2017-05-25 DIAGNOSIS — Z7901 Long term (current) use of anticoagulants: Secondary | ICD-10-CM

## 2017-05-25 DIAGNOSIS — Z23 Encounter for immunization: Secondary | ICD-10-CM

## 2017-05-25 DIAGNOSIS — Z6841 Body Mass Index (BMI) 40.0 and over, adult: Secondary | ICD-10-CM | POA: Diagnosis not present

## 2017-05-25 DIAGNOSIS — I10 Essential (primary) hypertension: Secondary | ICD-10-CM | POA: Insufficient documentation

## 2017-05-25 LAB — POCT GLYCOSYLATED HEMOGLOBIN (HGB A1C): Hemoglobin A1C: 5.5

## 2017-05-25 MED ORDER — HYDROCHLOROTHIAZIDE 25 MG PO TABS: 25.0000 mg | ORAL_TABLET | Freq: Every day | ORAL | 5 refills | Status: DC

## 2017-05-25 MED ORDER — DOCUSATE SODIUM 100 MG PO CAPS: 100.0000 mg | ORAL_CAPSULE | Freq: Two times a day (BID) | ORAL | 5 refills | Status: DC

## 2017-05-25 MED ORDER — APIXABAN 5 MG PO TABS: 10.0000 mg | ORAL_TABLET | Freq: Two times a day (BID) | ORAL | 5 refills | Status: DC

## 2017-05-25 NOTE — Patient Instructions (Addendum)
Will start Eliquis at 10 mg twice daily for the next 7 days.  Will return to a dose of 5 mg twice daily.  Return to clinic in 1 month to recheck creatinine level. We will increase hydrochlorothiazide to 25 mg daily.  Increase daily activity in 1 week. Will start Colace 100 mg at bedtime for the purpose of softening stools also increase daily fiber intake.  Increase water intake to 6-8 glasses/day  DASH Eating Plan DASH stands for "Dietary Approaches to Stop Hypertension." The DASH eating plan is a healthy eating plan that has been shown to reduce high blood pressure (hypertension). It may also reduce your risk for type 2 diabetes, heart disease, and stroke. The DASH eating plan may also help with weight loss. What are tips for following this plan? General guidelines  Avoid eating more than 2,300 mg (milligrams) of salt (sodium) a day. If you have hypertension, you may need to reduce your sodium intake to 1,500 mg a day.  Limit alcohol intake to no more than 1 drink a day for nonpregnant women and 2 drinks a day for men. One drink equals 12 oz of beer, 5 oz of wine, or 1 oz of hard liquor.  Work with your health care provider to maintain a healthy body weight or to lose weight. Ask what an ideal weight is for you.  Get at least 30 minutes of exercise that causes your heart to beat faster (aerobic exercise) most days of the week. Activities may include walking, swimming, or biking.  Work with your health care provider or diet and nutrition specialist (dietitian) to adjust your eating plan to your individual calorie needs. Reading food labels  Check food labels for the amount of sodium per serving. Choose foods with less than 5 percent of the Daily Value of sodium. Generally, foods with less than 300 mg of sodium per serving fit into this eating plan.  To find whole grains, look for the word "whole" as the first word in the ingredient list. Shopping  Buy products labeled as "low-sodium" or "no  salt added."  Buy fresh foods. Avoid canned foods and premade or frozen meals. Cooking  Avoid adding salt when cooking. Use salt-free seasonings or herbs instead of table salt or sea salt. Check with your health care provider or pharmacist before using salt substitutes.  Do not fry foods. Cook foods using healthy methods such as baking, boiling, grilling, and broiling instead.  Cook with heart-healthy oils, such as olive, canola, soybean, or sunflower oil. Meal planning   Eat a balanced diet that includes: ? 5 or more servings of fruits and vegetables each day. At each meal, try to fill half of your plate with fruits and vegetables. ? Up to 6-8 servings of whole grains each day. ? Less than 6 oz of lean meat, poultry, or fish each day. A 3-oz serving of meat is about the same size as a deck of cards. One egg equals 1 oz. ? 2 servings of low-fat dairy each day. ? A serving of nuts, seeds, or beans 5 times each week. ? Heart-healthy fats. Healthy fats called Omega-3 fatty acids are found in foods such as flaxseeds and coldwater fish, like sardines, salmon, and mackerel.  Limit how much you eat of the following: ? Canned or prepackaged foods. ? Food that is high in trans fat, such as fried foods. ? Food that is high in saturated fat, such as fatty meat. ? Sweets, desserts, sugary drinks, and other foods  with added sugar. ? Full-fat dairy products.  Do not salt foods before eating.  Try to eat at least 2 vegetarian meals each week.  Eat more home-cooked food and less restaurant, buffet, and fast food.  When eating at a restaurant, ask that your food be prepared with less salt or no salt, if possible. What foods are recommended? The items listed may not be a complete list. Talk with your dietitian about what dietary choices are best for you. Grains Whole-grain or whole-wheat bread. Whole-grain or whole-wheat pasta. Brown rice. [place]. Bulgur. Whole-grain and low-sodium  cereals. Pita bread. Low-fat, low-sodium crackers. Whole-wheat flour tortillas. Vegetables Fresh or frozen vegetables (raw, steamed, roasted, or grilled). Low-sodium or reduced-sodium tomato and vegetable juice. Low-sodium or reduced-sodium tomato sauce and tomato paste. Low-sodium or reduced-sodium canned vegetables. Fruits All fresh, dried, or frozen fruit. Canned fruit in natural juice (without added sugar). Meat and other protein foods Skinless chicken or [place]. Ground chicken or [place]. Pork with fat trimmed off. Fish and seafood. Egg whites. Dried beans, peas, or lentils. Unsalted nuts, nut butters, and seeds. Unsalted canned beans. Lean cuts of beef with fat trimmed off. Low-sodium, lean deli meat. Dairy Low-fat (1%) or fat-free (skim) milk. Fat-free, low-fat, or reduced-fat cheeses. Nonfat, low-sodium ricotta or cottage cheese. Low-fat or nonfat yogurt. Low-fat, low-sodium cheese. Fats and oils Soft margarine without trans fats. Vegetable oil. Low-fat, reduced-fat, or light mayonnaise and salad dressings (reduced-sodium). Canola, safflower, olive, soybean, and sunflower oils. Avocado. Seasoning and other foods Herbs. Spices. Seasoning mixes without salt. Unsalted popcorn and pretzels. Fat-free sweets. What foods are not recommended? The items listed may not be a complete list. Talk with your dietitian about what dietary choices are best for you. Grains Baked goods made with fat, such as croissants, muffins, or some breads. Dry pasta or rice meal packs. Vegetables Creamed or fried vegetables. Vegetables in a cheese sauce. Regular canned vegetables (not low-sodium or reduced-sodium). Regular canned tomato sauce and paste (not low-sodium or reduced-sodium). Regular tomato and vegetable juice (not low-sodium or reduced-sodium). Angie Fava. Olives. Fruits Canned fruit in a light or heavy syrup. Fried fruit. Fruit in cream or butter sauce. Meat and other protein foods Fatty cuts of meat. Ribs.  Fried meat. Berniece Salines. Sausage. Bologna and other processed lunch meats. Salami. Fatback. Hotdogs. Bratwurst. Salted nuts and seeds. Canned beans with added salt. Canned or smoked fish. Whole eggs or egg yolks. Chicken or [place] with skin. Dairy Whole or 2% milk, cream, and half-and-half. Whole or full-fat cream cheese. Whole-fat or sweetened yogurt. Full-fat cheese. Nondairy creamers. Whipped toppings. Processed cheese and cheese spreads. Fats and oils Butter. Stick margarine. Lard. Shortening. Ghee. Bacon fat. Tropical oils, such as coconut, palm kernel, or palm oil. Seasoning and other foods Salted popcorn and pretzels. Onion salt, garlic salt, seasoned salt, table salt, and sea salt. Worcestershire sauce. Tartar sauce. Barbecue sauce. Teriyaki sauce. Soy sauce, including reduced-sodium. Steak sauce. Canned and packaged gravies. Fish sauce. Oyster sauce. Cocktail sauce. Horseradish that you find on the shelf. Ketchup. Mustard. Meat flavorings and tenderizers. Bouillon cubes. Hot sauce and Tabasco sauce. Premade or packaged marinades. Premade or packaged taco seasonings. Relishes. Regular salad dressings. Where to find more information:  National Heart, Lung, and [place]: https://wilson-eaton.com/  American Heart Association: www.heart.org Summary  The DASH eating plan is a healthy eating plan that has been shown to reduce high blood pressure (hypertension). It may also reduce your risk for type 2 diabetes, heart disease, and stroke.  With the  DASH eating plan, you should limit salt (sodium) intake to 2,300 mg a day. If you have hypertension, you may need to reduce your sodium intake to 1,500 mg a day.  When on the DASH eating plan, aim to eat more fresh fruits and vegetables, whole grains, lean proteins, low-fat dairy, and heart-healthy fats.  Work with your health care provider or diet and nutrition specialist (dietitian) to adjust your eating plan to your individual calorie needs. This  information is not intended to replace advice given to you by your health care provider. Make sure you discuss any questions you have with your health care provider. Document Released: 07/01/2011 Document Revised: 07/05/2016 Document Reviewed: 07/05/2016 Elsevier Interactive Patient Education  2017 [employer].

## 2017-05-25 NOTE — Progress Notes (Signed)
Clayton Pena, 56 year old male with a history of hypertension and DVTs presents for a follow-up.  Patient was recently evaluated in the emergency department for pain in the right lower extremity and was found to have septic thrombophlebitis without evidence of a DVT.  Eliquis was increased to 15 mg twice daily for 7 days and doxycycline 100 mg twice daily for 10 days.  Patient denies any signs of bleeding.  Pain to right lower extremity has improved on medication regimen.  Patient also following up for hypertension.  He has been consistently taking hydrochlorothiazide 12.5 mg daily.  He does not exercise routinely or follow a low-fat, low-sodium diet.  He does not check blood pressures at home.  He denies chest pains, fatigue, shortness of breath, dizziness, swelling to bilateral lower extremities, or syncope.  His cardiac risk factors include morbid obesity and sedentary lifestyle. Past Medical History:  Diagnosis Date  . Angina at rest [facility])   . Coronary artery disease    Social History   Social History  . Marital status: Married    Spouse name: N/A  . Number of children: N/A  . Years of education: N/A   Occupational History  . Not on file.   Social History Main Topics  . Smoking status: Never Smoker  . Smokeless tobacco: Never Used  . Alcohol use No     Comment: occasional  . Drug use: No  . Sexual activity: Not on file   Other Topics Concern  . Not on file   Social History Narrative  . No narrative on file   Immunization History  Administered Date(s) Administered  . Influenza,inj,Quad PF,6+ Mos 06/15/2016    Review of Systems  Constitutional: Negative.        Weight gain  HENT: Negative.   Eyes: Negative.   Respiratory: Negative.   Cardiovascular: Negative for chest pain and leg swelling.  Genitourinary: Negative.   Musculoskeletal: Negative.   Skin: Negative.   Neurological: Negative.   Psychiatric/Behavioral: Negative.   Physical Exam  Constitutional: He is  oriented to person, place, and time.  HENT:  Head: Normocephalic and atraumatic.  Right Ear: External ear normal.  Left Ear: External ear normal.  Nose: Nose normal.  Mouth/Throat: Oropharynx is clear and moist.  Eyes: Pupils are equal, round, and reactive to light. Conjunctivae are normal.  Neck: Normal range of motion. Neck supple.  Cardiovascular: Normal rate, regular rhythm, normal heart sounds and intact distal pulses.   Pulmonary/Chest: Effort normal and breath sounds normal.  Abdominal: Soft. Bowel sounds are normal. There is no tenderness.  Increased abdominal obesity  Neurological: He is alert and oriented to person, place, and time. Gait normal.  Skin: Skin is warm.  Psychiatric: Mood, memory, affect and judgment normal.  1. Essential hypertension Blood pressure is above goal. Will increase hydrochlorothiazide to 25 mg daily.   - BASIC METABOLIC PANEL WITH GFR; Future - Lipid Panel; Future  2. Morbid obesity with BMI of 40.0-44.9, adult (HCC) A 10 pound weight loss over the next 3 months as discussed. Recommend a lowfat, low carbohydrate diet divided over 5-6 small meals, increase water intake to 6-8 glasses, and 150 minutes per week of cardiovascular exercise.    - HgB A1c  3. Chronic anticoagulation Will follow up in 1 month to check creatinine level.  - apixaban (ELIQUIS) 5 MG TABS tablet; Take 2 tablets (10 mg total) by mouth 2 (two) times daily. For 7 days, then start your other prescription for 5mg  (one tablet) twice  daily  Dispense: 60 tablet; Refill: 5  4. Chronic constipation Increase daily fiber and water intake.  - docusate sodium (COLACE) 100 MG capsule; Take 1 capsule (100 mg total) by mouth 2 (two) times daily.  Dispense: 30 capsule; Refill: 5  5. Influenza vaccination given  - Flu Vaccine QUAD 6+ mos PF IM (Fluarix Quad PF)   RTC: 3 months for hypertension. Will follow up in 1 month to check creatinine level on Eliquis.   Donia Pounds  MSN,  FNP-C [facility] [address] [place], [place] [address] [phone]

## 2017-06-14 ENCOUNTER — Telehealth: Payer: Self-pay | Admitting: Family Medicine

## 2017-06-14 NOTE — Telephone Encounter (Signed)
Pt called to confirm appt for Friday, November 30th; verified appointment with pt; all questions answered

## 2017-06-24 ENCOUNTER — Other Ambulatory Visit: Payer: Medicaid Other

## 2017-06-24 DIAGNOSIS — I10 Essential (primary) hypertension: Secondary | ICD-10-CM

## 2017-06-24 LAB — BASIC METABOLIC PANEL WITH GFR
BUN: 14 mg/dL (ref 7–25)
CO2: 26 mmol/L (ref 20–32)
CREATININE: 0.88 mg/dL (ref 0.70–1.33)
Calcium: 9.3 mg/dL (ref 8.6–10.3)
Chloride: 101 mmol/L (ref 98–110)
GFR, EST NON AFRICAN AMERICAN: 95 mL/min/{1.73_m2} (ref >=60)
GFR, Est African American: 110 mL/min/{1.73_m2} (ref >=60)
Glucose, Bld: 116 mg/dL — ABNORMAL HIGH (ref 65–99)
POTASSIUM: 4.1 mmol/L (ref 3.5–5.3)
SODIUM: 138 mmol/L (ref 135–146)

## 2017-06-24 LAB — LIPID PANEL
CHOL/HDL RATIO: 3.2 (calc) (ref <5.0)
CHOLESTEROL: 122 mg/dL (ref <200)
HDL: 38 mg/dL — ABNORMAL LOW (ref >40)
LDL CHOLESTEROL (CALC): 64 mg/dL
Non-HDL Cholesterol (Calc): 84 mg/dL (calc) (ref <130)
TRIGLYCERIDES: 114 mg/dL (ref <150)

## 2017-06-27 ENCOUNTER — Telehealth: Payer: Self-pay

## 2017-06-27 NOTE — Telephone Encounter (Signed)
-----   Message from Dorena Dew, [place] sent at 06/25/2017  8:45 AM EST ----- Regarding: lab resutls Please inform patient that HDL cholesterol is decreased. Recommend a lowfat, low carbohydrate diet divided over 5-6 small meals, increase water intake to 6-8 glasses, and 150 minutes per week of cardiovascular exercise. Also, recommend OTC fish oil tablet. Will follow up as previously scheduled.   Thanks

## 2017-06-27 NOTE — Telephone Encounter (Signed)
Called and spoke with patient. Advised that hdl is decreased and that he should eat a low fat/ low cholesterol diet over 5 to 6 small meals daily. Advised to take fish oil otc and to keep next scheduled appointment. Patient verbalized understanding. Thanks!

## 2017-07-12 ENCOUNTER — Telehealth: Payer: Self-pay

## 2017-07-12 NOTE — Telephone Encounter (Signed)
[place], Patient is requesting a refill on Tylenol #3. If this is fill able please fax to [facility] in [place].    I have spoken to patient regarding HCTZ. He received a letter from pharmacy that his medication was recalled and to discard it. I advised him to call the pharmacy and see if they had a replacement they could give him and if not to call us back and let us know. Thanks!

## 2017-07-15 ENCOUNTER — Other Ambulatory Visit: Payer: Self-pay | Admitting: Family Medicine

## 2017-07-15 DIAGNOSIS — M79605 Pain in left leg: Secondary | ICD-10-CM

## 2017-07-15 MED ORDER — ACETAMINOPHEN-CODEINE #3 300-30 MG PO TABS: 1.0000 | ORAL_TABLET | Freq: Four times a day (QID) | ORAL | 0 refills | Status: DC | PRN

## 2017-07-15 NOTE — Progress Notes (Signed)
Reviewed [place] Substance Reporting system prior to prescribing opiate medications. No inconsistencies noted.  Meds ordered this encounter  Medications  . acetaminophen-codeine (TYLENOL #3) 300-30 MG tablet    Sig: Take 1 tablet by mouth every 6 (six) hours as needed for moderate pain.    Dispense:  15 tablet    Refill:  0    Order Specific Question:   Supervising Provider    Answer:   Tresa Garter [[id number]]  Donia Pounds  MSN, FNP-C [facility] [address] [place], [place] [address] [phone]

## 2017-07-18 ENCOUNTER — Other Ambulatory Visit: Payer: Self-pay | Admitting: Family Medicine

## 2017-07-18 DIAGNOSIS — M79605 Pain in left leg: Secondary | ICD-10-CM

## 2017-07-19 ENCOUNTER — Telehealth: Payer: Self-pay

## 2017-07-20 NOTE — Telephone Encounter (Signed)
Spoke with patient. I have a confirmation that it went through to pharmacy on 07/15/2017. I will fax again today. Thanks!

## 2017-08-04 ENCOUNTER — Other Ambulatory Visit: Payer: Self-pay | Admitting: Family Medicine

## 2017-08-04 DIAGNOSIS — K429 Umbilical hernia without obstruction or gangrene: Secondary | ICD-10-CM

## 2017-08-22 DIAGNOSIS — Z7901 Long term (current) use of anticoagulants: Secondary | ICD-10-CM | POA: Diagnosis not present

## 2017-08-22 DIAGNOSIS — Z86718 Personal history of other venous thrombosis and embolism: Secondary | ICD-10-CM | POA: Insufficient documentation

## 2017-08-22 DIAGNOSIS — Z6841 Body Mass Index (BMI) 40.0 and over, adult: Secondary | ICD-10-CM | POA: Diagnosis not present

## 2017-08-22 DIAGNOSIS — I10 Essential (primary) hypertension: Secondary | ICD-10-CM | POA: Diagnosis not present

## 2017-08-22 DIAGNOSIS — K429 Umbilical hernia without obstruction or gangrene: Secondary | ICD-10-CM | POA: Diagnosis not present

## 2017-08-25 ENCOUNTER — Ambulatory Visit (INDEPENDENT_AMBULATORY_CARE_PROVIDER_SITE_OTHER): Payer: Medicaid Other | Admitting: Family Medicine

## 2017-08-25 ENCOUNTER — Encounter: Payer: Self-pay | Admitting: Family Medicine

## 2017-08-25 VITALS — BP 132/86 | HR 76 | Temp 97.7°F | Resp 18 | Ht 72.0 in | Wt 328.0 lb

## 2017-08-25 DIAGNOSIS — I10 Essential (primary) hypertension: Secondary | ICD-10-CM | POA: Diagnosis not present

## 2017-08-25 DIAGNOSIS — R809 Proteinuria, unspecified: Secondary | ICD-10-CM

## 2017-08-25 DIAGNOSIS — H6692 Otitis media, unspecified, left ear: Secondary | ICD-10-CM | POA: Diagnosis not present

## 2017-08-25 DIAGNOSIS — R05 Cough: Secondary | ICD-10-CM

## 2017-08-25 DIAGNOSIS — Z7901 Long term (current) use of anticoagulants: Secondary | ICD-10-CM | POA: Diagnosis not present

## 2017-08-25 DIAGNOSIS — R059 Cough, unspecified: Secondary | ICD-10-CM

## 2017-08-25 DIAGNOSIS — R0989 Other specified symptoms and signs involving the circulatory and respiratory systems: Secondary | ICD-10-CM | POA: Diagnosis not present

## 2017-08-25 LAB — POCT URINALYSIS DIP (DEVICE)
BILIRUBIN URINE: NEGATIVE
GLUCOSE, UA: NEGATIVE mg/dL
Hgb urine dipstick: NEGATIVE
Ketones, ur: NEGATIVE mg/dL
Leukocytes, UA: NEGATIVE
NITRITE: NEGATIVE
Protein, ur: 30 mg/dL — AB
Specific Gravity, Urine: 1.025 (ref 1.005–1.030)
UROBILINOGEN UA: 0.2 mg/dL (ref 0.0–1.0)
pH: 5.5 (ref 5.0–8.0)

## 2017-08-25 MED ORDER — AZITHROMYCIN 250 MG PO TABS: ORAL_TABLET | ORAL | 0 refills | Status: DC

## 2017-08-25 MED ORDER — APIXABAN 5 MG PO TABS: 5.0000 mg | ORAL_TABLET | Freq: Two times a day (BID) | ORAL | 5 refills | Status: DC

## 2017-08-25 MED ORDER — CHLORPHEN-PE-ACETAMINOPHEN 4-10-325 MG PO TABS: 1.0000 | ORAL_TABLET | Freq: Four times a day (QID) | ORAL | 0 refills | Status: DC | PRN

## 2017-08-25 MED ORDER — APIXABAN 5 MG PO TABS: 10.0000 mg | ORAL_TABLET | Freq: Two times a day (BID) | ORAL | 5 refills | Status: DC

## 2017-08-25 MED ORDER — HYDROCHLOROTHIAZIDE 25 MG PO TABS: 25.0000 mg | ORAL_TABLET | Freq: Every day | ORAL | 5 refills | Status: DC

## 2017-08-25 NOTE — Patient Instructions (Signed)
For left ear infection, will treat with azithromycin.  Take 500 mg on day 1 and 250 mg on days 2 through 5.  Increase rest, handwashing, and water intake.  For sore throat pain, recommend salt water gargles as needed.  For upper respiratory symptoms including cough, runny nose, and nasal congestion recommend Norel AD every 6 hours as needed. Upper Respiratory Infection, Adult Most upper respiratory infections (URIs) are caused by a virus. A URI affects the nose, throat, and upper air passages. The most common type of URI is often called "the common cold." Follow these instructions at home:  Take medicines only as told by your doctor.  Gargle warm saltwater or take cough drops to comfort your throat as told by your doctor.  Use a warm mist humidifier or inhale steam from a shower to increase air moisture. This may make it easier to breathe.  Drink enough fluid to keep your pee (urine) clear or pale yellow.  Eat soups and other clear broths.  Have a healthy diet.  Rest as needed.  Go back to work when your fever is gone or your doctor says it is okay. ? You may need to stay home longer to avoid giving your URI to others. ? You can also wear a face mask and wash your hands often to prevent spread of the virus.  Use your inhaler more if you have asthma.  Do not use any tobacco products, including cigarettes, chewing tobacco, or electronic cigarettes. If you need help quitting, ask your doctor. Contact a doctor if:  You are getting worse, not better.  Your symptoms are not helped by medicine.  You have chills.  You are getting more short of breath.  You have brown or red mucus.  You have yellow or brown discharge from your nose.  You have pain in your face, especially when you bend forward.  You have a fever.  You have puffy (swollen) neck glands.  You have pain while swallowing.  You have white areas in the back of your throat. Get help right away if:  You have very bad  or constant: ? Headache. ? Ear pain. ? Pain in your forehead, behind your eyes, and over your cheekbones (sinus pain). ? Chest pain.  You have long-lasting (chronic) lung disease and any of the following: ? Wheezing. ? Long-lasting cough. ? Coughing up blood. ? A change in your usual mucus.  You have a stiff neck.  You have changes in your: ? Vision. ? Hearing. ? Thinking. ? Mood. This information is not intended to replace advice given to you by your health care provider. Make sure you discuss any questions you have with your health care provider. Document Released: 12/29/2007 Document Revised: 03/14/2016 Document Reviewed: 10/17/2013 Elsevier Interactive Patient Education  2018 [employer].  Otitis Media, Adult Otitis media is redness, soreness, and puffiness (swelling) in the space just behind your eardrum (middle ear). It may be caused by allergies or infection. It often happens along with a cold. Follow these instructions at home:  Take your medicine as told. Finish it even if you start to feel better.  Only take over-the-counter or prescription medicines for pain, discomfort, or fever as told by your doctor.  Follow up with your doctor as told. Contact a doctor if:  You have otitis media only in one ear, or bleeding from your nose, or both.  You notice a lump on your neck.  You are not getting better in 3-5 days.  You  feel worse instead of better. Get help right away if:  You have pain that is not helped with medicine.  You have puffiness, redness, or pain around your ear.  You get a stiff neck.  You cannot move part of your face (paralysis).  You notice that the bone behind your ear hurts when you touch it. This information is not intended to replace advice given to you by your health care provider. Make sure you discuss any questions you have with your health care provider. Document Released: 12/29/2007 Document Revised: 12/18/2015 Document Reviewed:  02/06/2013 Elsevier Interactive Patient Education  2017 [employer].

## 2017-08-25 NOTE — Progress Notes (Signed)
Chief Complaint  Patient presents with  . Hypertension  . Nasal Congestion  . Cough   Subjective:    Patient ID: Clayton Pena, male    DOB: 1960-08-25, 57 y.o.   MRN: [id number]  HPI Clayton Pena, a 57 year old male with a history of hypertension and chronic anticoagulation therapy for DVT.  Patient is taking all prescribed medications consistently.  He is complaining of upper respiratory symptoms over the past 5-6 days.  Symptoms include nasal congestion, throat pain, postnasal drip, cough with congestion.  Patient has taken over-the-counter antihistamines without sustained relief.  Patient also endorses fullness to left ear.  Patient is requesting a shot of antibiotics today due to financial concerns.  He states that he cannot afford an oral prescription.  Patient denies fever, headache, nausea, vomiting, or diarrhea.  Patient also has a history of hypertension.  Hypertension has been controlled on hydrochlorothiazide 25 mg daily.  Patient does not follow a low-fat, low carbohydrate diet.  He does not exercise routinely.  Patient is morbidly obese, Body mass index is 44.48 kg/m.  He denies shortness of breath, heart palpitation, lower extremity edema, or syncope.   Patient is on Eliquis 5 mg BID for chronic anticoagulation therapy. He denies any signs of bleeding. Past Medical History:  Diagnosis Date  . Angina at rest [facility])   . Coronary artery disease    Allergies as of 08/25/2017      Reactions   Penicillins Anaphylaxis   Has patient had a PCN reaction causing immediate rash, facial/tongue/throat swelling, SOB or lightheadedness with hypotension: unknown Has patient had a PCN reaction causing severe rash involving mucus membranes or skin necrosis: unknown Has patient had a PCN reaction that required hospitalization: unknown Has patient had a PCN reaction occurring within the last 10 years: no If all of the above answers are "NO", then may proceed with Cephalosporin use.   Demerol  [meperidine] Other (See Comments)   REACTION:  Increases blood pressure      Medication List        Accurate as of 08/25/17 12:08 PM. Always use your most recent med list.          acetaminophen-codeine 300-30 MG tablet Commonly known as:  TYLENOL #3 Take 1 tablet by mouth every 6 (six) hours as needed for moderate pain.   apixaban 5 MG Tabs tablet Commonly known as:  ELIQUIS Take 1 tablet (5 mg total) by mouth 2 (two) times daily. For 7 days, then start your other prescription for 5mg  (one tablet) twice daily   azithromycin 250 MG tablet Commonly known as:  ZITHROMAX Day 1 take 500 mg, take 250 mg on days 2-5   Chlorphen-PE-Acetaminophen 4-10-325 MG Tabs Commonly known as:  NOREL AD Take 1 tablet by mouth every 6 (six) hours as needed.   docusate sodium 100 MG capsule Commonly known as:  COLACE Take 1 capsule (100 mg total) by mouth 2 (two) times daily.   hydrochlorothiazide 25 MG tablet Commonly known as:  HYDRODIURIL Take 1 tablet (25 mg total) by mouth daily.   Medical Compression Stockings Misc 1 each by Does not apply route daily.      Social History   Socioeconomic History  . Marital status: Married    Spouse name: Not on file  . Number of children: Not on file  . Years of education: Not on file  . Highest education level: Not on file  Social Needs  . Financial resource strain: Not on file  . Food  insecurity - worry: Not on file  . Food insecurity - inability: Not on file  . Transportation needs - medical: Not on file  . Transportation needs - non-medical: Not on file  Occupational History  . Not on file  Tobacco Use  . Smoking status: Never Smoker  . Smokeless tobacco: Never Used  Substance and Sexual Activity  . Alcohol use: No    Comment: occasional  . Drug use: No  . Sexual activity: Not on file  Other Topics Concern  . Not on file  Social History Narrative  . Not on file   Allergies  Allergen Reactions  . Penicillins Anaphylaxis     Has patient had a PCN reaction causing immediate rash, facial/tongue/throat swelling, SOB or lightheadedness with hypotension: unknown Has patient had a PCN reaction causing severe rash involving mucus membranes or skin necrosis: unknown Has patient had a PCN reaction that required hospitalization: unknown Has patient had a PCN reaction occurring within the last 10 years: no If all of the above answers are "NO", then may proceed with Cephalosporin use.   . Demerol [Meperidine] Other (See Comments)    REACTION:  Increases blood pressure   Immunization History  Administered Date(s) Administered  . Influenza,inj,Quad PF,6+ Mos 06/15/2016, 05/25/2017     Review of Systems  Constitutional: Positive for fatigue. Negative for fever.  HENT: Positive for congestion, postnasal drip, rhinorrhea and sore throat. Negative for ear discharge, ear pain, facial swelling, hearing loss, sinus pressure and sinus pain.   Eyes: Negative.   Respiratory: Positive for cough.   Cardiovascular: Negative.  Negative for chest pain, palpitations and leg swelling.  Gastrointestinal: Negative.   Endocrine: Negative.   Genitourinary: Negative.   Musculoskeletal: Negative.   Skin: Negative.   Allergic/Immunologic: Negative.   Psychiatric/Behavioral: Negative.        Objective:   Physical Exam  Constitutional: He is oriented to person, place, and time.  HENT:  Right Ear: Hearing, tympanic membrane and external ear normal.  Left Ear: No drainage. Tympanic membrane is erythematous. No decreased hearing is noted.  Nose: Mucosal edema present.  Mouth/Throat: Posterior oropharyngeal erythema present.  Dullness to left ear with erythema.   Eyes: Pupils are equal, round, and reactive to light.  Cardiovascular: Normal rate, normal heart sounds and intact distal pulses.  Pulmonary/Chest: Effort normal and breath sounds normal.  Abdominal: Soft. Bowel sounds are normal.  Musculoskeletal: Normal range of motion.    Neurological: He is alert and oriented to person, place, and time. He has normal reflexes.  Skin: Skin is warm and dry.       BP 132/86 (BP Location: Left Arm, Patient Position: Sitting, Cuff Size: Large)   Pulse 76   Temp 97.7 F (36.5 C) (Oral)   Resp 18   Ht 6' (1.829 m)   Wt (!) 328 lb (148.8 kg)   SpO2 99%   BMI 44.48 kg/m  Assessment & Plan:  1. Essential hypertension Blood pressure is at goal on current medication regimen, no changes warranted on today Reviewed urinalysis, proteinuria present which is consistent with baseline. Will review urine microalbumin results as they become available - hydrochlorothiazide (HYDRODIURIL) 25 MG tablet; Take 1 tablet (25 mg total) by mouth daily.  Dispense: 30 tablet; Refill: 5 - Basic Metabolic Panel; Future - POCT urinalysis dip (device)  2. Morbid obesity ([place]) Recommend a lowfat, low carbohydrate diet divided over 5-6 small meals, increase water intake to 6-8 glasses, and 150 minutes per week of cardiovascular exercise.  3. Chronic anticoagulation Reviewed serum creatinine level, within normal limits.  Will recheck in 3 months - apixaban (ELIQUIS) 5 MG TABS tablet; Take 1 tablet (5 mg total) by mouth 2 (two) times daily. For 7 days, then start your other prescription for 5mg  (one tablet) twice daily  Dispense: 60 tablet; Refill: 5  4. Symptoms of upper respiratory infection (URI) - Chlorphen-PE-Acetaminophen (NOREL AD) 4-10-325 MG TABS; Take 1 tablet by mouth every 6 (six) hours as needed.  Dispense: 20 tablet; Refill: 0  5. Left otitis media, unspecified otitis media type - azithromycin (ZITHROMAX) 250 MG tablet; Day 1 take 500 mg, take 250 mg on days 2-5  Dispense: 6 tablet; Refill: 0 - Chlorphen-PE-Acetaminophen (NOREL AD) 4-10-325 MG TABS; Take 1 tablet by mouth every 6 (six) hours as needed.  Dispense: 20 tablet; Refill: 0  6. Cough Recommend the patient increase fluid intake, rest, and handwashing.  Also,  refer to #4  and 5    RTC: 3 months for labs and 6 months for hypertension   Donia Pounds  MSN, FNP-C Patient [facility] [address] [place], [place] [address] [phone]

## 2017-08-26 LAB — MICROALBUMIN / CREATININE URINE RATIO
CREATININE, UR: 246.2 mg/dL
MICROALBUM., U, RANDOM: 64.5 ug/mL
Microalb/Creat Ratio: 26.2 mg/g creat (ref 0.0–30.0)

## 2017-09-13 DIAGNOSIS — I2510 Atherosclerotic heart disease of native coronary artery without angina pectoris: Secondary | ICD-10-CM | POA: Diagnosis not present

## 2017-09-13 DIAGNOSIS — G478 Other sleep disorders: Secondary | ICD-10-CM | POA: Diagnosis not present

## 2017-09-13 DIAGNOSIS — I252 Old myocardial infarction: Secondary | ICD-10-CM | POA: Diagnosis not present

## 2017-09-13 DIAGNOSIS — Z86718 Personal history of other venous thrombosis and embolism: Secondary | ICD-10-CM | POA: Diagnosis not present

## 2017-09-13 DIAGNOSIS — Z01818 Encounter for other preprocedural examination: Secondary | ICD-10-CM | POA: Diagnosis not present

## 2017-09-13 DIAGNOSIS — R0683 Snoring: Secondary | ICD-10-CM | POA: Diagnosis not present

## 2017-09-13 DIAGNOSIS — I10 Essential (primary) hypertension: Secondary | ICD-10-CM | POA: Diagnosis not present

## 2017-09-13 DIAGNOSIS — Z6841 Body Mass Index (BMI) 40.0 and over, adult: Secondary | ICD-10-CM | POA: Diagnosis not present

## 2017-09-13 DIAGNOSIS — R400 Somnolence: Secondary | ICD-10-CM | POA: Diagnosis not present

## 2017-09-13 DIAGNOSIS — G4700 Insomnia, unspecified: Secondary | ICD-10-CM | POA: Diagnosis not present

## 2017-09-13 DIAGNOSIS — K432 Incisional hernia without obstruction or gangrene: Secondary | ICD-10-CM | POA: Diagnosis not present

## 2017-10-04 ENCOUNTER — Telehealth: Payer: Self-pay

## 2017-10-04 NOTE — Telephone Encounter (Signed)
-----   Message from Dorena Dew, [place] sent at 10/04/2017  5:57 AM EDT ----- Regarding: RE: DME supplies  There has to be appropriate documentation when placing orders. He needs an assessment and appropriate diagnosis to support ordering DME. I will need to review post surgical recommendations.   Thanks ----- Message ----- From: Adelina Mings, LPN Sent: 10/25/270  11:43 AM To: Dorena Dew, FNP Subject: DME supplies                                   Patient had hernia surgery and needs an order for a "Lift tolite seat" extra wide. And a Pull up bar to assist with going to the rest room. His surgeon asked him to get these rx's from his primary. If this can be done please send to [facility]. Thanks!

## 2017-10-04 NOTE — Telephone Encounter (Signed)
Called and left a message advising patient that we need review notes from surgery and asked that he contact surgeon and have those faxed to [place]. Also advised that he will possibly need an assessment after review of notes. Left call back number and asked if any questions to call us back. Thanks!

## 2017-10-06 ENCOUNTER — Encounter: Payer: Self-pay | Admitting: Family Medicine

## 2017-10-06 ENCOUNTER — Ambulatory Visit: Payer: Medicaid Other | Admitting: Family Medicine

## 2017-10-06 VITALS — BP 138/77 | HR 79 | Temp 97.8°F | Resp 16 | Ht 73.0 in | Wt 328.0 lb

## 2017-10-06 DIAGNOSIS — I10 Essential (primary) hypertension: Secondary | ICD-10-CM | POA: Diagnosis not present

## 2017-10-06 DIAGNOSIS — Z8719 Personal history of other diseases of the digestive system: Secondary | ICD-10-CM | POA: Diagnosis not present

## 2017-10-06 DIAGNOSIS — R5381 Other malaise: Secondary | ICD-10-CM

## 2017-10-06 DIAGNOSIS — Z9889 Other specified postprocedural states: Secondary | ICD-10-CM

## 2017-10-06 DIAGNOSIS — Z6841 Body Mass Index (BMI) 40.0 and over, adult: Secondary | ICD-10-CM | POA: Diagnosis not present

## 2017-10-06 LAB — POCT URINALYSIS DIP (DEVICE)
Bilirubin Urine: NEGATIVE
Glucose, UA: NEGATIVE mg/dL
HGB URINE DIPSTICK: NEGATIVE
KETONES UR: NEGATIVE mg/dL
Nitrite: NEGATIVE
PH: 6.5 (ref 5.0–8.0)
PROTEIN: NEGATIVE mg/dL
Specific Gravity, Urine: 1.02 (ref 1.005–1.030)
Urobilinogen, UA: 1 mg/dL (ref 0.0–1.0)

## 2017-10-06 NOTE — Progress Notes (Signed)
Subjective:    Patient ID: Clayton Pena, male    DOB: February 23, 1961, 57 y.o.   MRN: [id number]  HPI A 57 year old male with a history of hypertension, DVT, morbid obesity, and status post hernia repair presents for follow-up.Patient is status post hernia repair and is complaining of deconditioning. Patyient underwent an open incisional hernia repair, partial omentectyomy, and laporascopic ventral hernia repair with mesh. He tolerated the procedure well and there were no intraoperative complications. He says that he is no longer having pain, but continues to have weakness and difficulty with ADLs. Patient is mostly sedentary.    Clayton Pena ambulates with a walker or cane assistance.  He is requesting a raised toilet seat and toilet bar.  Patient also has a history of hypertension. He does not exercise, has minimal physical activity, and does not follow a lowfat, low sodium diet. Body mass index is 43.27 kg/m. He has been taking antihypertensive medication consistently. He denies headache, dizziness, heart palpitations, shortness of breath, or lower extremity edema.  Past Medical History:  Diagnosis Date  . Angina at rest [facility])   . Coronary artery disease    Social History   Socioeconomic History  . Marital status: Married    Spouse name: Not on file  . Number of children: Not on file  . Years of education: Not on file  . Highest education level: Not on file  Social Needs  . Financial resource strain: Not on file  . Food insecurity - worry: Not on file  . Food insecurity - inability: Not on file  . Transportation needs - medical: Not on file  . Transportation needs - non-medical: Not on file  Occupational History  . Not on file  Tobacco Use  . Smoking status: Never Smoker  . Smokeless tobacco: Never Used  Substance and Sexual Activity  . Alcohol use: No    Comment: occasional  . Drug use: No  . Sexual activity: Not on file  Other Topics Concern  . Not on file  Social History  Narrative  . Not on file   Immunization History  Administered Date(s) Administered  . Influenza,inj,Quad PF,6+ Mos 06/15/2016, 05/25/2017   Review of Systems  Constitutional: Negative.  Negative for fatigue.       Weakness  HENT: Negative.   Eyes: Negative.   Respiratory: Negative.   Cardiovascular: Negative.   Gastrointestinal: Negative.   Endocrine: Negative.   Genitourinary: Negative.   Musculoskeletal: Positive for arthralgias (lower extremities).  Allergic/Immunologic: Negative.   Neurological: Negative.        Abnormal gait  Hematological: Negative.   Psychiatric/Behavioral: Negative.        Objective:   Physical Exam  Constitutional: He is oriented to person, place, and time.  Morbid obesity  Eyes: Pupils are equal, round, and reactive to light.  Cardiovascular: Normal rate, regular rhythm, normal heart sounds and intact distal pulses.  Pulmonary/Chest: Effort normal and breath sounds normal.  Abdominal: Soft. Bowel sounds are normal.  Increased abdominal girth  Neurological: He is alert and oriented to person, place, and time. Gait abnormal.  Skin: Skin is warm and dry.  Psychiatric: He has a normal mood and affect. His behavior is normal. Judgment and thought content normal.         BP 138/77 (BP Location: Right Arm, Patient Position: Sitting, Cuff Size: Large)   Pulse 79   Temp 97.8 F (36.6 C) (Oral)   Resp 16   Ht 6\' 1"  (1.854 m)   Wt Marland Kitchen)  328 lb (148.8 kg)   SpO2 98%   BMI 43.27 kg/m  Assessment & Plan:  1. Physical deconditioning Patient say that he has not made an effort to resume normal routine following surgery.  He has been unable to lift up from low toilet seat due to abdominal guarding since surgery.  Patient warrants elevated toilet seat and toilet bars for safety and fall prevention.  Patient may benefit from physical therapy consult.  - Elevated toilet seat  2. Status post hernia repair Follow up with Dr. Florene Glen, general surgeon as  scheduled.   3. Morbid obesity with BMI of 40.0-44.9, adult ([place]) Body mass index is 43.27 kg/m. The patient is asked to make an attempt to improve diet and exercise patterns to aid in medical management of this problem.  Discussed that patient should increase physical activity. Also, recommend low fat, low carbohydrate diet.   4. Essential hypertension Blood pressure is at goal on current medication regimen, no changes warranted on today. - Continue medication, monitor blood pressure at home. Continue DASH diet. Reminder to go to the ER if any CP, SOB, nausea, dizziness, severe HA, changes vision/speech, left arm numbness and tingling and jaw pain.   RTC: As previously scheduled   Donia Pounds  MSN, FNP-C Patient [facility] [address] [place], [place] [address] [phone]

## 2017-10-06 NOTE — Patient Instructions (Addendum)
Follow with surgeon as scheduled.   Given orders for raised toilet seat and toilet safety rails.    Follow up in office as needed.  Deconditioning Deconditioning refers to the changes in your body that occur during a period of inactivity. The changes happen in your heart, lungs, and muscles. They decrease your ability to be active, and they make you feel tired and weak. There are three stages of deconditioning:  Mild deconditioning. At this stage, you will notice a change in your ability to do your usual exercise activities, such as running, biking, or swimming.  Moderate deconditioning. At this stage, you will notice a change in your ability to do normal everyday activities, such as walking, grocery shopping, and doing chores.  Severe deconditioning. At this stage, you will notice a change in your ability to do minimal activity or normal self-care.  Deconditioning can occur after only a few days of inactivity. The longer the period of inactivity, the more severe the deconditioning will be, and the longer it will take to return to your previous level of functioning. What are the causes? Deconditioning is often caused by inactivity due to:  Illnesses, such as cancer, stroke, heart attack, fibromyalgia, and chronic fatigue syndrome.  Injuries, especially back injuries, broken bones, and ligament and tendon injuries.  A long stay in the hospital.  Pregnancy, especially if long periods of bed rest are needed.  What increases the risk? This condition is more likely to develop in:  People who are hospitalized.  People on bed rest.  People who are obese.  People with poor nutrition.  Elderly adults.  People with injuries or illnesses that interfere with movement and activity.  What are the signs or symptoms? Symptoms of deconditioning include:  Weakness.  Tiredness.  Shortness of breath with minor exertion.  A faster-than-normal heartbeat. You may not notice this without  taking your pulse.  Pain or discomfort with activity.  Decreased strength.  Decreased sense of balance.  Decreased endurance.  Difficulty doing your usual forms of exercise.  Difficulty doing activities of daily living, such as grocery shopping or chores.  Difficulty walking around the house and doing basic self-care, such as getting to the bathroom, preparing meals, or doing laundry.  How is this diagnosed? Deconditioning is diagnosed based on your medical history and a physical exam. During the physical exam, your health care provider will check for signs of deconditioning, such as:  Decreased size of muscles.  Decreased strength.  Trouble with balance.  Shortness of breath or abnormally increased heart rate after minor exertion.  How is this treated? Treatment for deconditioning usually involves following a structured exercise program in which activity is increased gradually. Your health care provider will determine which exercises are right for you. The exercise program will likely include aerobic exercise and strength training:  Aerobic exercise helps improve the functioning of the heart and lungs as well as the muscles.  Strength training helps improve muscle size and strength.  Both of these types of exercise will improve your endurance. You may be referred to a physical therapist who can create a safe strengthening program for you to follow. Follow these instructions at home:  Follow the exercise program that is recommended by your health care provider or physical therapist.  Do not increase your exercise any faster than directed.  Eat a healthy diet.  Do not use any products that contain nicotine or tobacco, such as cigarettes and e-cigarettes. If you need help quitting, ask your health  care provider.  Take over-the-counter and prescription medicines only as told by your health care provider.  Keep all follow-up visits as told by your health care provider. This  is important. Contact a health care provider if:  You are not able to carry out the prescribed exercise program.  You are becoming more and more fatigued and weak.  You become light-headed when rising to a sitting or standing position.  Your level of endurance decreases after it has improved. Get help right away if:  You have chest pain.  You are very short of breath.  You have any episodes of passing out. This information is not intended to replace advice given to you by your health care provider. Make sure you discuss any questions you have with your health care provider. Document Released: 11/26/2013 Document Revised: 01/30/2016 Document Reviewed: 10/11/2015 Elsevier Interactive Patient Education  2018 [employer].  Preventing Unhealthy [employer], Adult Staying at a healthy weight is important. When fat builds up in your body, you may become overweight or obese. These conditions put you at greater risk for developing certain health problems, such as heart disease, diabetes, sleeping problems, joint problems, and some cancers. Unhealthy weight gain is often the result of making unhealthy choices in what you eat. It is also a result of not getting enough exercise. You can make changes to your lifestyle to prevent obesity and stay as healthy as possible. What nutrition changes can be made? To maintain a healthy weight and prevent obesity:  Eat only as much as your body needs. To do this: ? Pay attention to signs that you are hungry or full. Stop eating as soon as you feel full. ? If you feel hungry, try drinking water first. Drink enough water so your urine is clear or pale yellow. ? Eat smaller portions. ? Look at serving sizes on food labels. Most foods contain more than one serving per container. ? Eat the recommended amount of calories for your gender and activity level. While most active people should eat around 2,000 calories per day, if you are trying to lose weight or are  not very active, you main need to eat less calories. Talk to your health care provider or dietitian about how many calories you should eat each day.  Choose healthy foods, such as: ? Fruits and vegetables. Try to fill at least half of your plate at each meal with fruits and vegetables. ? Whole grains, such as whole wheat bread, brown rice, and quinoa. ? Lean meats, such as chicken or fish. ? Other healthy proteins, such as beans, eggs, or tofu. ? Healthy fats, such as nuts, seeds, fatty fish, and olive oil. ? Low-fat or fat-free dairy.  Check food labels and avoid food and drinks that: ? Are high in calories. ? Have added sugar. ? Are high in sodium. ? Have saturated fats or trans fats.  Limit how much you eat of the following foods: ? Prepackaged meals. ? Fast food. ? Fried foods. ? Processed meat, such as bacon, sausage, and deli meats. ? Fatty cuts of red meat and poultry with skin.  Cook foods in healthier ways, such as by baking, broiling, or grilling.  When grocery shopping, try to shop around the outside of the store. This helps you buy mostly fresh foods and avoid canned and prepackaged foods.  What lifestyle changes can be made?  Exercise at least 30 minutes 5 or more days each week. Exercising includes brisk walking, yard work, biking, running,  swimming, and team sports like basketball and soccer. Ask your health care provider which exercises are safe for you.  Do not use any products that contain nicotine or tobacco, such as cigarettes and e-cigarettes. If you need help quitting, ask your health care provider.  Limit alcohol intake to no more than 1 drink a day for nonpregnant women and 2 drinks a day for men. One drink equals 12 oz of beer, 5 oz of wine, or 1 oz of hard liquor.  Try to get 7-9 hours of sleep each night. What other changes can be made?  Keep a food and activity journal to keep track of: ? What you ate and how many calories you had. Remember to count  sauces, dressings, and side dishes. ? Whether you were active, and what exercises you did. ? Your calorie, weight, and activity goals.  Check your weight regularly. Track any changes. If you notice you have gained weight, make changes to your diet or activity routine.  Avoid taking weight-loss medicines or supplements. Talk to your health care provider before starting any new medicine or supplement.  Talk to your health care provider before trying any new diet or exercise plan. Why are these changes important? Eating healthy, staying active, and having healthy habits not only help prevent obesity, they also:  Help you to manage stress and emotions.  Help you to connect with friends and family.  Improve your self-esteem.  Improve your sleep.  Prevent long-term health problems.  What can happen if changes are not made? Being obese or overweight can cause you to develop joint or bone problems, which can make it hard for you to stay active or do activities you enjoy. Being obese or overweight also puts stress on your heart and lungs and can lead to health problems like diabetes, heart disease, and some cancers. Where to find more information: Talk with your health care provider or a dietitian about healthy eating and healthy lifestyle choices. You may also find other information through these resources:  U.S. Department of Agriculture MyPlate: FormerBoss.no  American Heart Association: www.heart.org  Centers for Disease Control and Prevention: http://www.wolf.info/  Summary  Staying at a healthy weight is important. It helps prevent certain diseases and health problems, such as heart disease, diabetes, joint problems, sleep disorders, and some cancers.  Being obese or overweight can cause you to develop joint or bone problems, which can make it hard for you to stay active or do activities you enjoy.  You can prevent unhealthy weight gain by eating a healthy diet, exercising  regularly, not smoking, limiting alcohol, and getting enough sleep.  Talk with your health care provider or a dietitian for guidance about healthy eating and healthy lifestyle choices. This information is not intended to replace advice given to you by your health care provider. Make sure you discuss any questions you have with your health care provider. Document Released: 07/13/2016 Document Revised: 08/18/2016 Document Reviewed: 08/18/2016 Elsevier Interactive Patient Education  [employer].

## 2017-10-06 NOTE — Progress Notes (Signed)
c 

## 2017-10-20 ENCOUNTER — Telehealth: Payer: Self-pay

## 2017-10-20 NOTE — Telephone Encounter (Signed)
[place], Please advise if this can be written for patient? Thanks!

## 2017-10-21 ENCOUNTER — Encounter: Payer: Self-pay | Admitting: Family Medicine

## 2017-11-21 ENCOUNTER — Other Ambulatory Visit: Payer: Medicaid Other

## 2017-11-21 DIAGNOSIS — I10 Essential (primary) hypertension: Secondary | ICD-10-CM

## 2017-11-22 ENCOUNTER — Telehealth: Payer: Self-pay

## 2017-11-22 ENCOUNTER — Other Ambulatory Visit: Payer: Self-pay

## 2017-11-22 LAB — BASIC METABOLIC PANEL
BUN/Creatinine Ratio: 19 (ref 9–20)
BUN: 16 mg/dL (ref 6–24)
CALCIUM: 9.3 mg/dL (ref 8.7–10.2)
CO2: 22 mmol/L (ref 20–29)
CREATININE: 0.84 mg/dL (ref 0.76–1.27)
Chloride: 101 mmol/L (ref 96–106)
GFR calc non Af Amer: 97 mL/min/{1.73_m2} (ref >59)
GFR, EST AFRICAN AMERICAN: 112 mL/min/{1.73_m2} (ref >59)
Glucose: 100 mg/dL — ABNORMAL HIGH (ref 65–99)
POTASSIUM: 4 mmol/L (ref 3.5–5.2)
SODIUM: 140 mmol/L (ref 134–144)

## 2017-11-22 NOTE — Telephone Encounter (Signed)
-----   Message from Dorena Dew, [place] sent at 11/22/2017  6:19 AM EDT ----- Regarding: lab results Please inform patient that labs are within a normal range. No medication changes warranted, follow up as scheduled.   Donia Pounds  MSN, FNP-C [facility] [id number] [place], [place] [id number] [phone]

## 2017-11-22 NOTE — Telephone Encounter (Signed)
Called, no answer. Left a message for patient that all labs were within a normal range and no medication changes are needed at this time. Asked that he keep next scheduled follow up and call if any questions. Thanks!

## 2017-12-28 ENCOUNTER — Encounter (HOSPITAL_COMMUNITY): Payer: Self-pay | Admitting: Emergency Medicine

## 2017-12-28 ENCOUNTER — Emergency Department (HOSPITAL_COMMUNITY): Payer: Medicaid Other

## 2017-12-28 ENCOUNTER — Other Ambulatory Visit: Payer: Self-pay

## 2017-12-28 ENCOUNTER — Emergency Department (HOSPITAL_COMMUNITY)
Admission: EM | Admit: 2017-12-28 | Discharge: 2017-12-28 | Disposition: A | Discharge status: Home / Self Care | Payer: Medicaid Other | Attending: Emergency Medicine | Admitting: Emergency Medicine

## 2017-12-28 DIAGNOSIS — Z79899 Other long term (current) drug therapy: Secondary | ICD-10-CM | POA: Insufficient documentation

## 2017-12-28 DIAGNOSIS — J40 Bronchitis, not specified as acute or chronic: Secondary | ICD-10-CM | POA: Diagnosis not present

## 2017-12-28 DIAGNOSIS — I2510 Atherosclerotic heart disease of native coronary artery without angina pectoris: Secondary | ICD-10-CM | POA: Insufficient documentation

## 2017-12-28 DIAGNOSIS — S6991XA Unspecified injury of right wrist, hand and finger(s), initial encounter: Secondary | ICD-10-CM | POA: Diagnosis present

## 2017-12-28 DIAGNOSIS — Y929 Unspecified place or not applicable: Secondary | ICD-10-CM | POA: Diagnosis not present

## 2017-12-28 DIAGNOSIS — Y998 Other external cause status: Secondary | ICD-10-CM | POA: Insufficient documentation

## 2017-12-28 DIAGNOSIS — X500XXA Overexertion from strenuous movement or load, initial encounter: Secondary | ICD-10-CM | POA: Diagnosis not present

## 2017-12-28 DIAGNOSIS — S63681A Other sprain of right thumb, initial encounter: Secondary | ICD-10-CM | POA: Insufficient documentation

## 2017-12-28 DIAGNOSIS — I10 Essential (primary) hypertension: Secondary | ICD-10-CM | POA: Insufficient documentation

## 2017-12-28 DIAGNOSIS — Z7901 Long term (current) use of anticoagulants: Secondary | ICD-10-CM | POA: Diagnosis not present

## 2017-12-28 DIAGNOSIS — Y9389 Activity, other specified: Secondary | ICD-10-CM | POA: Insufficient documentation

## 2017-12-28 MED ORDER — PREDNISONE 50 MG PO TABS: ORAL_TABLET | ORAL | 0 refills | Status: DC

## 2017-12-28 MED ORDER — IPRATROPIUM-ALBUTEROL 0.5-2.5 (3) MG/3ML IN SOLN
3.0000 mL | Freq: Once | RESPIRATORY_TRACT | Status: AC
  Administered 2017-12-28: 3 mL via RESPIRATORY_TRACT
  Filled 2017-12-28: qty 3

## 2017-12-28 MED ORDER — ALBUTEROL SULFATE HFA 108 (90 BASE) MCG/ACT IN AERS
2.0000 | INHALATION_SPRAY | RESPIRATORY_TRACT | Status: DC | PRN
  Administered 2017-12-28: 2 via RESPIRATORY_TRACT
  Filled 2017-12-28: qty 6.7

## 2017-12-28 MED ORDER — BENZONATATE 100 MG PO CAPS: 100.0000 mg | ORAL_CAPSULE | Freq: Three times a day (TID) | ORAL | 0 refills | Status: DC

## 2017-12-28 NOTE — Discharge Instructions (Addendum)
Follow up with your health care provider. Return here as needed.

## 2017-12-28 NOTE — ED Notes (Signed)
Ortho paged about a velcro thumb spica.

## 2017-12-28 NOTE — ED Notes (Signed)
Bed: WTR5 Expected date:  Expected time:  Means of arrival:  Comments: 

## 2017-12-28 NOTE — ED Provider Notes (Signed)
[facility] DEPT Provider Note   CSN: [id number] Arrival date & time: 12/28/17  1108     History   Chief Complaint Chief Complaint  Patient presents with  . Hand Injury  . Cough    HPI Clayton Pena is a 57 y.o. male who presents to the ED with right thumb pain. Patient reports he was picking up a suitcase that was about 40 pounds and felt severe pain in his thumb. The injury occurred 2 nights ago and has gotten worse. Patient was afraid it was broken or dislocated.  Patient also c/o cough that has been persistent for about a month. He has been taking OTC medication without relief. He reports that he thought it was allergies and that it get worse when he is outside in the pollen or around smoke.   HPI  Past Medical History:  Diagnosis Date  . Angina at rest [facility])   . Coronary artery disease     Patient Active Problem List   Diagnosis Date Noted  . Essential hypertension 05/25/2017  . Morbid obesity with BMI of 40.0-44.9, adult ([place]) 05/25/2017  . Blood pressure check 02/08/2017  . Morbid obesity ([place]) 01/12/2017  . Acute venous embolism and thrombosis of deep vessels of proximal end of left lower extremity ([place]) 07/09/2016  . CAD (coronary artery disease) 06/14/2016  . Cellulitis 06/14/2016  . Phlebitis 06/13/2016    Past Surgical History:  Procedure Laterality Date  . BACK SURGERY    . CORONARY ANGIOPLASTY    . HERNIA REPAIR          Home Medications    Prior to Admission medications   Medication Sig Start Date End Date Taking? Authorizing Provider  acetaminophen-codeine (TYLENOL #3) 300-30 MG tablet Take 1 tablet by mouth every 6 (six) hours as needed for moderate pain. Patient not taking: Reported on 10/06/2017 07/15/17   Dorena Dew, FNP  apixaban (ELIQUIS) 5 MG TABS tablet Take 1 tablet (5 mg total) by mouth 2 (two) times daily. For 7 days, then start your other prescription for 5mg  (one tablet) twice daily 08/25/17    Dorena Dew, FNP  benzonatate (TESSALON) 100 MG capsule Take 1 capsule (100 mg total) by mouth every 8 (eight) hours. 12/28/17   Ashley Murrain, NP  Elastic Bandages & Supports (MEDICAL COMPRESSION STOCKINGS) MISC 1 each by Does not apply route daily. Patient not taking: Reported on 10/06/2017 12/07/16   Dorena Dew, FNP  hydrochlorothiazide (HYDRODIURIL) 25 MG tablet Take 1 tablet (25 mg total) by mouth daily. 08/25/17   Dorena Dew, FNP  HYDROcodone-acetaminophen (NORCO/VICODIN) 5-325 MG tablet Take 1 tablet by mouth every 6 (six) hours as needed for moderate pain.    [provider]  polyethylene glycol powder (GLYCOLAX/MIRALAX) powder Take 1 Container by mouth once.    [provider]  predniSONE (DELTASONE) 50 MG tablet Take one tablet PO daily 12/28/17   Ashley Murrain, NP  senna (SENOKOT) 8.6 MG tablet Take 1 tablet by mouth daily.    [provider]    Family History Family History  Problem Relation Age of Onset  . CAD Mother   . Diabetes Mother   . CAD Father   . Prostate cancer Father   . Diabetes Brother   . Stroke Neg Hx     Social History Social History   Tobacco Use  . Smoking status: Never Smoker  . Smokeless tobacco: Never Used  Substance Use Topics  .  Alcohol use: No    Comment: occasional  . Drug use: No     Allergies   Penicillins and Demerol [meperidine]   Review of Systems Review of Systems  Constitutional: Negative for chills and fever.  HENT: Negative.   Eyes: Negative for discharge and redness.  Respiratory: Positive for cough.   Cardiovascular: Negative for chest pain.  Gastrointestinal: Negative for nausea and vomiting.  Musculoskeletal: Positive for arthralgias.  Skin: Negative for wound.  Neurological: Negative for headaches.  Psychiatric/Behavioral: Negative for confusion.     Physical Exam Updated Vital Signs BP (!) 151/87 (BP Location: Left Arm)   Pulse 92   Temp 98 F (36.7 C) (Oral)   Resp  16   SpO2 99%   Physical Exam  Constitutional: He appears well-developed and well-nourished. No distress.  HENT:  Head: Normocephalic.  Eyes: EOM are normal.  Neck: Neck supple.  Cardiovascular: Normal rate and regular rhythm.  Pulmonary/Chest: Effort normal. No respiratory distress. He has wheezes (right upper). He has no rales.  Musculoskeletal:       Right hand: He exhibits tenderness.  Limited range of motion of the right thumb due to pain. There is swelling noted. Adequate circulation, good touch sensation.   Neurological: He is alert.  Skin: Skin is warm and dry.  Psychiatric: He has a normal mood and affect. His behavior is normal.  Nursing note and vitals reviewed.    ED Treatments / Results  Labs (all labs ordered are listed, but only abnormal results are displayed) Labs Reviewed - No data to display Radiology Dg Chest 2 View  Result Date: 12/28/2017 CLINICAL DATA:  Productive cough EXAM: CHEST - 2 VIEW COMPARISON:  07/02/2015 FINDINGS: The heart size and mediastinal contours are within normal limits. Both lungs are clear. The visualized skeletal structures are unremarkable. IMPRESSION: No active cardiopulmonary disease. Electronically Signed   By: Misty Stanley M.D.   On: 12/28/2017 12:24   Dg Hand Complete Right  Result Date: 12/28/2017 CLINICAL DATA:  Hand injury with pain. EXAM: RIGHT HAND - COMPLETE 3+ VIEW COMPARISON:  None. FINDINGS: No evidence for an acute fracture. No subluxation or dislocation. Degenerative changes are seen in the first carpometacarpal joint. 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 IMPRESSION: No acute bony abnormality. Electronically Signed   By: Misty Stanley M.D.   On: 12/28/2017 12:26    Procedures Procedures (including critical care time)  Medications Ordered in ED Medications  albuterol (PROVENTIL HFA;VENTOLIN HFA) 108 (90 Base) MCG/ACT inhaler 2 puff (2 puffs Inhalation Given 12/28/17 1351)  ipratropium-albuterol  (DUONEB) 0.5-2.5 (3) MG/3ML nebulizer solution 3 mL (3 mLs Nebulization Given 12/28/17 1308)     Initial Impression / Assessment and Plan / ED Course  I have reviewed the triage vital signs and the nursing notes. 57 y.o. male here with right hand pain s/p injury stable for d/c without focal neuro deficits and no fracture or dislocation noted on x-ray. Thumb spica Velcro splint applied. Patient also with cough that has been persistent for a few weeks. Normal CXR. Will treat for bronchitis and patient encouraged to f/u with PCP.   Final Clinical Impressions(s) / ED Diagnoses   Final diagnoses:  Bronchitis  Other sprain of right thumb, initial encounter    ED Discharge Orders        Ordered    predniSONE (DELTASONE) 50 MG tablet     12/28/17 1343    benzonatate (TESSALON) 100 MG capsule  Every 8 hours  12/28/17 1343       Ashley Murrain, NP 12/28/17 [place]    Tegeler, Gwenyth Allegra, MD 12/28/17 [phone]

## 2017-12-28 NOTE — ED Triage Notes (Signed)
Patient here from home with complaints of right hand pain.Reports that he was trying to lift something and injured it. Also reports nagging dry cough that will not go away.

## 2018-01-01 IMAGING — CT CT FEMUR *L* W/ CM
3 series · 12 of 35 positions shown, 14 images · IV contrast (iopamidol)
Comparison: None.

CLINICAL DATA: Progressive left thigh pain, erythema and soreness.
Clinical concern of infection. History of myocardial infarction.

EXAM:
CT OF THE LEFT FEMUR WITH CONTRAST
TECHNIQUE: Multi detector CT imaging of the left thigh was performed following
intravenous contrast administration. Multiplanar reformatted images
were generated.
CONTRAST:  100mL [id number] IOPAMIDOL ([id number]) INJECTION 61%

[Series 8: axial st · axial · 0.67mm/px · z∈[-911,-536]mm · 4 of 362 slices shown, 5 images]
[im 56/362  soft-tissue]
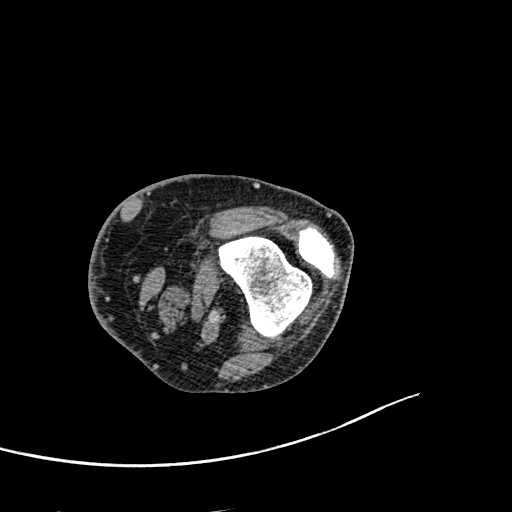
[im 56/362  bone]
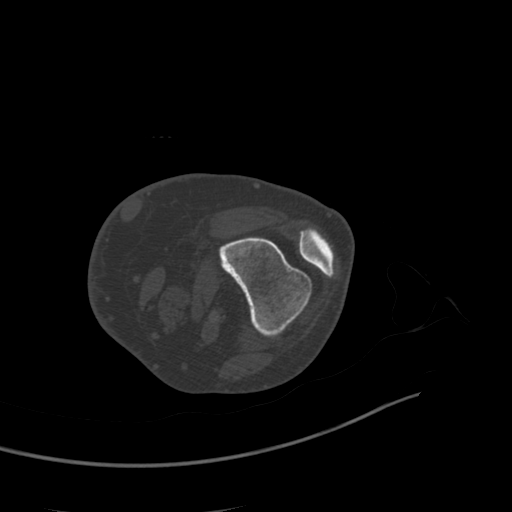
[im 139/362  bone]
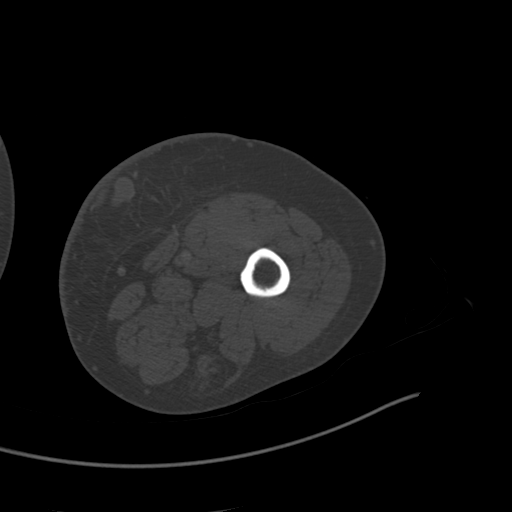
[im 223/362  bone]
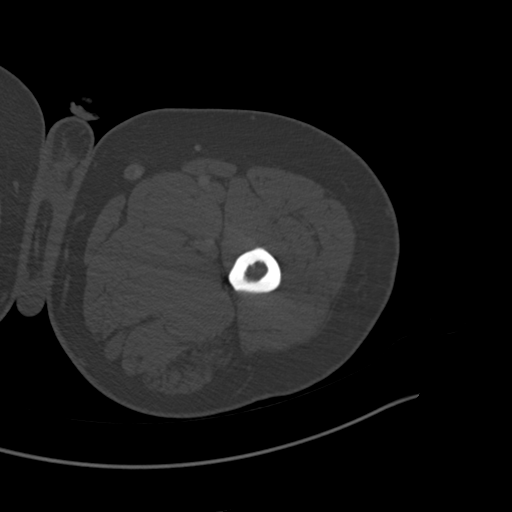
[im 306/362  bone]
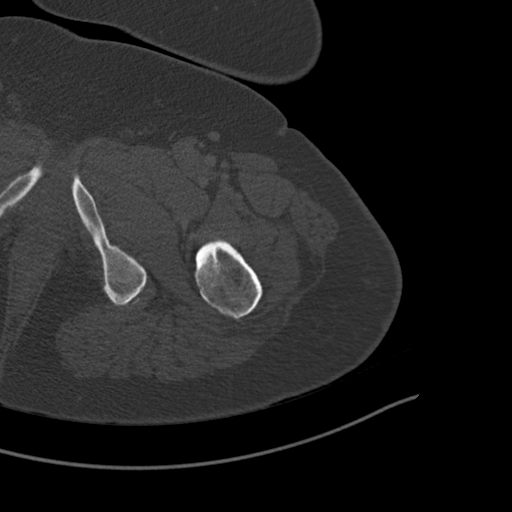

[Series 9: coronal st · coronal · 0.81mm/px · 3 of 150 slices shown]
[im 30/150  bone]
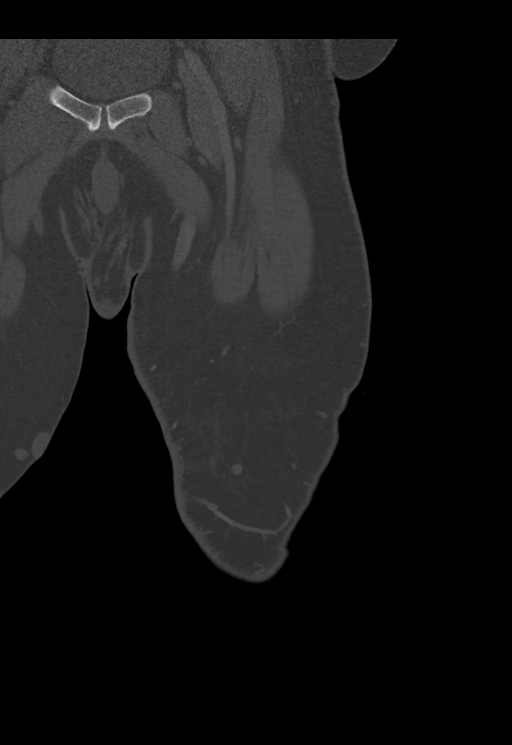
[im 60/150  bone]
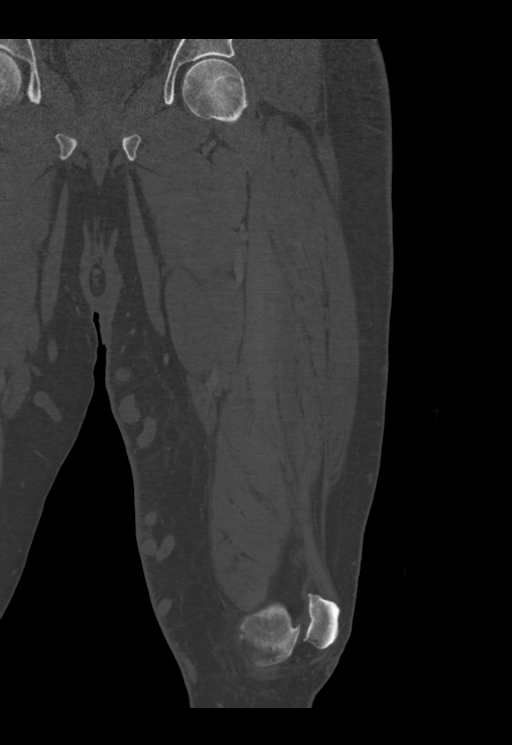
[im 90/150  bone]
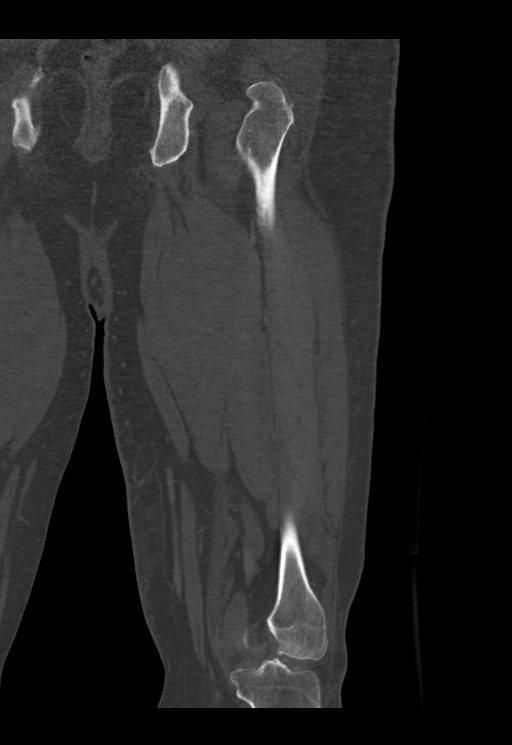

[Series 10: sagittal st · sagittal · 0.77mm/px · 5 of 183 slices shown, 6 images]
[im 61/183  bone]
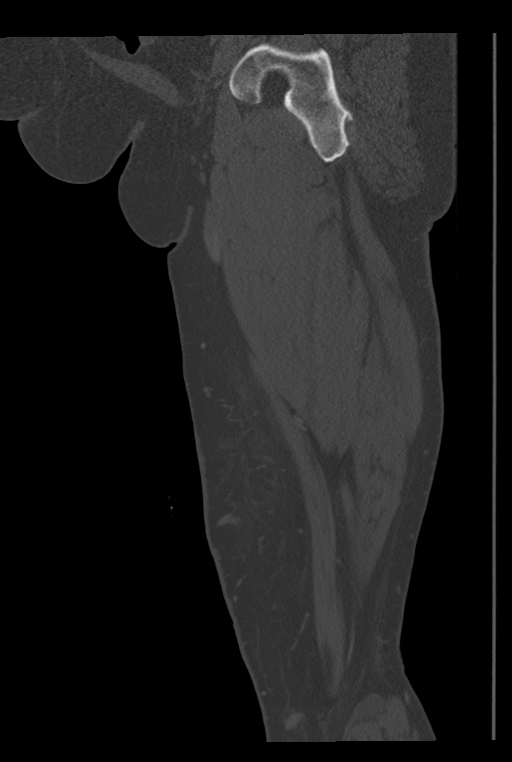
[im 76/183  bone]
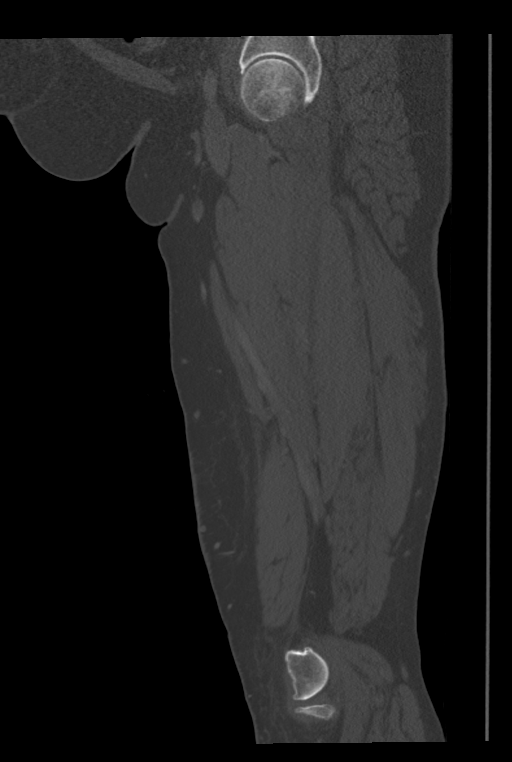
[im 92/183  soft-tissue]
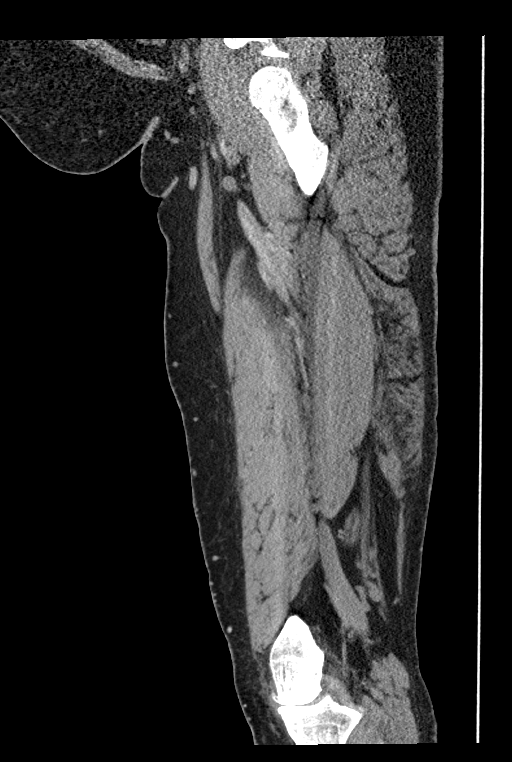
[im 92/183  bone]
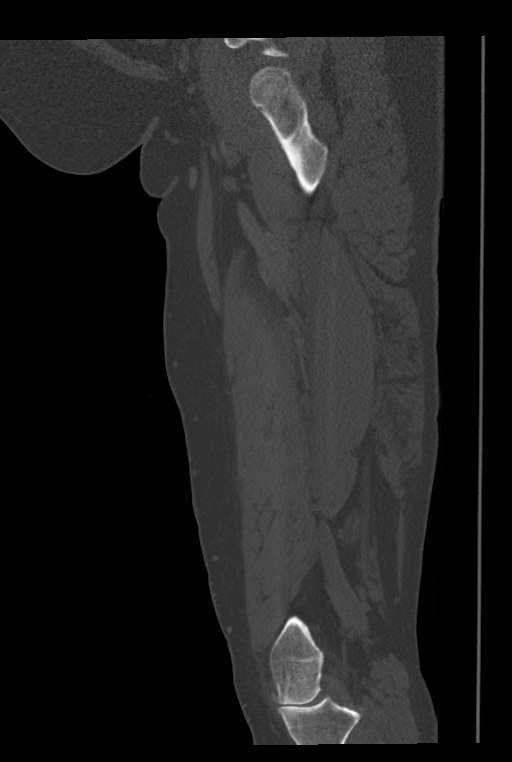
[im 107/183  bone]
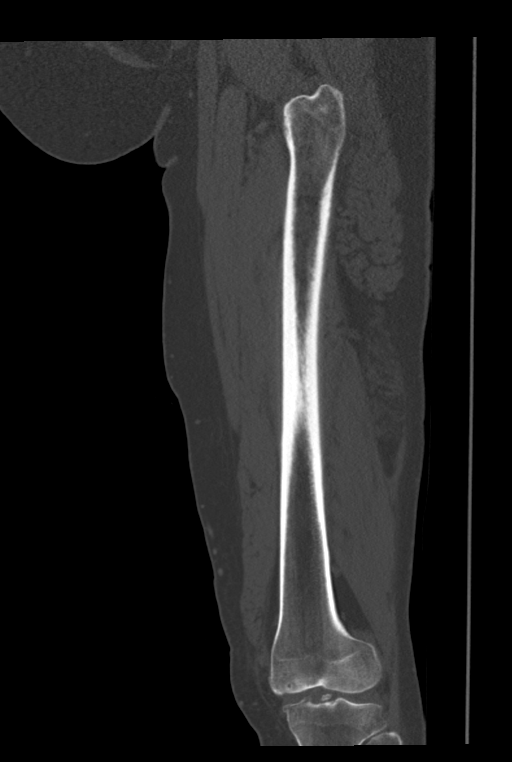
[im 122/183  bone]
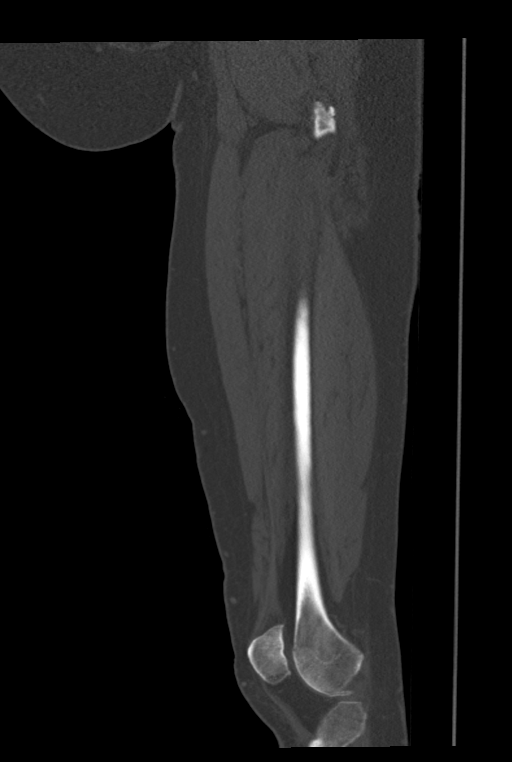

[12 of 35 positions shown; findings below may reference images not displayed]

FINDINGS: Bones/Joint/Cartilage

No evidence of acute fracture, dislocation or bone destruction.
There is no evidence of femoral head avascular necrosis. No
significant arthropathic changes are seen at the left hip. There are
tricompartmental degenerative changes at the left knee with a
central ossified loose body. No significant joint effusions.

Ligaments

Ligaments are suboptimally evaluated by CT. The cruciate ligaments
appear intact at the knee.

Muscles and Tendons
The left thigh muscles and tendons appear unremarkable.

Soft Tissues
There are prominent varicosities medially in the left thigh. Patency
of these vessels is suboptimally evaluated due to limited contrast
opacification. There is asymmetric skin thickening and mild
subcutaneous edema medially in the left thigh. No focal fluid
collection, soft tissue emphysema or foreign body identified.
IMPRESSION: 1. Asymmetric dermal thickening and subcutaneous edema medially in
the left thigh, suspicious for cellulitis. Correlate clinically. No
evidence of soft tissue abscess.
2. Prominent varicosities superficially in the medial left thigh.
3. No significant muscular or osseous findings.

## 2018-01-18 ENCOUNTER — Ambulatory Visit (INDEPENDENT_AMBULATORY_CARE_PROVIDER_SITE_OTHER): Payer: Medicaid Other | Admitting: Family Medicine

## 2018-01-18 ENCOUNTER — Encounter: Payer: Self-pay | Admitting: Family Medicine

## 2018-01-18 VITALS — BP 144/88 | HR 82 | Temp 98.3°F | Resp 16 | Ht 73.0 in | Wt 331.0 lb

## 2018-01-18 DIAGNOSIS — R0600 Dyspnea, unspecified: Secondary | ICD-10-CM

## 2018-01-18 DIAGNOSIS — R062 Wheezing: Secondary | ICD-10-CM | POA: Diagnosis not present

## 2018-01-18 DIAGNOSIS — J309 Allergic rhinitis, unspecified: Secondary | ICD-10-CM | POA: Diagnosis not present

## 2018-01-18 DIAGNOSIS — R053 Chronic cough: Secondary | ICD-10-CM

## 2018-01-18 DIAGNOSIS — R05 Cough: Secondary | ICD-10-CM | POA: Diagnosis not present

## 2018-01-18 MED ORDER — BENZONATATE 100 MG PO CAPS: 100.0000 mg | ORAL_CAPSULE | Freq: Three times a day (TID) | ORAL | 0 refills | Status: DC

## 2018-01-18 MED ORDER — CETIRIZINE HCL 10 MG PO TABS: 10.0000 mg | ORAL_TABLET | Freq: Every day | ORAL | 0 refills | Status: DC

## 2018-01-18 MED ORDER — IPRATROPIUM BROMIDE 0.02 % IN SOLN
0.5000 mg | Freq: Once | RESPIRATORY_TRACT | Status: AC
  Administered 2018-01-18: 0.5 mg via RESPIRATORY_TRACT

## 2018-01-18 MED ORDER — CETIRIZINE HCL 10 MG PO TABS: 10.0000 mg | ORAL_TABLET | Freq: Every day | ORAL | 11 refills | Status: DC

## 2018-01-18 MED ORDER — ALBUTEROL SULFATE HFA 108 (90 BASE) MCG/ACT IN AERS: 2.0000 | INHALATION_SPRAY | Freq: Four times a day (QID) | RESPIRATORY_TRACT | 0 refills | Status: DC | PRN

## 2018-01-18 MED ORDER — ALBUTEROL SULFATE (2.5 MG/3ML) 0.083% IN NEBU
2.5000 mg | INHALATION_SOLUTION | Freq: Once | RESPIRATORY_TRACT | Status: AC
  Administered 2018-01-18: 2.5 mg via RESPIRATORY_TRACT

## 2018-01-18 NOTE — Progress Notes (Signed)
Subjective:    Patient ID: Clayton Pena, male    DOB: 24-Dec-1960, 57 y.o.   MRN: [id number]  HPI Clayton Pena 57 year old male with a history of hypertension, DVT, morbid obesity, and status post hernia repair presents complaining of persistent cough. Patient was evaluated in the emergency department on 12/28/2017. Patient was treated for bronchitis. He says that he has been utilizing bronchodilator consistently without relief. Cough is aggravated by reclining position Associated symptoms include:shortness of breath, sputum production and wheezing. Patient does not have new pets. Patient denies a history of asthma. Patient does not  have a history of environmental allergens. Patient has not had recent travel. Patient had a recent chest xray on 12/28/2017, which was unremarkable.    Past Medical History:  Diagnosis Date  . Angina at rest [facility])   . Coronary artery disease    Social History   Socioeconomic History  . Marital status: Married    Spouse name: Not on file  . Number of children: Not on file  . Years of education: Not on file  . Highest education level: Not on file  Occupational History  . Not on file  Social Needs  . Financial resource strain: Not on file  . Food insecurity:    Worry: Not on file    Inability: Not on file  . Transportation needs:    Medical: Not on file    Non-medical: Not on file  Tobacco Use  . Smoking status: Never Smoker  . Smokeless tobacco: Never Used  Substance and Sexual Activity  . Alcohol use: No    Comment: occasional  . Drug use: No  . Sexual activity: Not on file  Lifestyle  . Physical activity:    Days per week: Not on file    Minutes per session: Not on file  . Stress: Not on file  Relationships  . Social connections:    Talks on phone: Not on file    Gets together: Not on file    Attends religious service: Not on file    Active member of club or organization: Not on file    Attends meetings of clubs or organizations: Not on file     Relationship status: Not on file  . Intimate partner violence:    Fear of current or ex partner: Not on file    Emotionally abused: Not on file    Physically abused: Not on file    Forced sexual activity: Not on file  Other Topics Concern  . Not on file  Social History Narrative  . Not on file   Immunization History  Administered Date(s) Administered  . Influenza,inj,Quad PF,6+ Mos 06/15/2016, 05/25/2017   Review of Systems  Constitutional: Negative.   HENT: Positive for postnasal drip.   Eyes: Negative.   Respiratory: Positive for cough, shortness of breath and wheezing.   Cardiovascular: Negative.   Gastrointestinal: Negative.   Endocrine: Negative.   Genitourinary: Negative.   Allergic/Immunologic: Negative.   Neurological: Negative.        Abnormal gait  Hematological: Negative.   Psychiatric/Behavioral: Negative.        Objective:   Physical Exam  Constitutional: He is oriented to person, place, and time.  Morbid obesity  HENT:  Right Ear: Tympanic membrane is erythematous.  Left Ear: Tympanic membrane is erythematous.  Eyes: Pupils are equal, round, and reactive to light.  Cardiovascular: Normal rate, regular rhythm, normal heart sounds and intact distal pulses.  Pulmonary/Chest: Effort normal. He has no decreased  breath sounds. He has wheezes. He has no rhonchi.  Abdominal: Soft. Bowel sounds are normal.  Increased abdominal girth  Neurological: He is alert and oriented to person, place, and time. Gait abnormal.  Skin: Skin is warm and dry.  Psychiatric: He has a normal mood and affect. His behavior is normal. Judgment and thought content normal.      BP (!) 144/88 (BP Location: Right Arm, Patient Position: Sitting, Cuff Size: Large)   Pulse 82   Temp 98.3 F (36.8 C) (Oral)   Resp 16   Ht 6\' 1"  (1.854 m)   Wt (!) 331 lb (150.1 kg)   SpO2 98%   BMI 43.67 kg/m  Assessment & Plan:  1. Persistent cough for 3 weeks or longer - albuterol (PROVENTIL  HFA;VENTOLIN HFA) 108 (90 Base) MCG/ACT inhaler; Inhale 2 puffs into the lungs every 6 (six) hours as needed for wheezing or shortness of breath.  Dispense: 1 Inhaler; Refill: 0 - benzonatate (TESSALON) 100 MG capsule; Take 1 capsule (100 mg total) by mouth every 8 (eight) hours.  Dispense: 21 capsule; Refill: 0 - albuterol (PROVENTIL) (2.5 MG/3ML) 0.083% nebulizer solution 2.5 mg - ipratropium (ATROVENT) nebulizer solution 0.5 mg  2. Wheezing - albuterol (PROVENTIL HFA;VENTOLIN HFA) 108 (90 Base) MCG/ACT inhaler; Inhale 2 puffs into the lungs every 6 (six) hours as needed for wheezing or shortness of breath.  Dispense: 1 Inhaler; Refill: 0 - albuterol (PROVENTIL) (2.5 MG/3ML) 0.083% nebulizer solution 2.5 mg - ipratropium (ATROVENT) nebulizer solution 0.5 mg  3. Dyspnea, unspecified type - albuterol (PROVENTIL HFA;VENTOLIN HFA) 108 (90 Base) MCG/ACT inhaler; Inhale 2 puffs into the lungs every 6 (six) hours as needed for wheezing or shortness of breath.  Dispense: 1 Inhaler; Refill: 0 - albuterol (PROVENTIL) (2.5 MG/3ML) 0.083% nebulizer solution 2.5 mg - ipratropium (ATROVENT) nebulizer solution 0.5 mg  4. Allergic rhinitis, unspecified seasonality, unspecified trigger - cetirizine (ZYRTEC) 10 MG tablet; Take 1 tablet (10 mg total) by mouth at bedtime.  Dispense: 30 tablet; Refill: 11   RTC: Follow up as previously scheduled for chronic conditons   Donia Pounds  MSN, FNP-C Patient [facility] [address] [place], [place] [address] [phone]

## 2018-01-19 NOTE — Patient Instructions (Signed)

## 2018-02-22 ENCOUNTER — Ambulatory Visit (INDEPENDENT_AMBULATORY_CARE_PROVIDER_SITE_OTHER): Payer: Medicaid Other | Admitting: Family Medicine

## 2018-02-22 ENCOUNTER — Encounter: Payer: Self-pay | Admitting: Family Medicine

## 2018-02-22 VITALS — BP 148/82 | HR 89 | Temp 98.0°F | Resp 18 | Ht 73.0 in | Wt 325.0 lb

## 2018-02-22 DIAGNOSIS — Z1159 Encounter for screening for other viral diseases: Secondary | ICD-10-CM

## 2018-02-22 DIAGNOSIS — Z114 Encounter for screening for human immunodeficiency virus [HIV]: Secondary | ICD-10-CM

## 2018-02-22 DIAGNOSIS — Z23 Encounter for immunization: Secondary | ICD-10-CM

## 2018-02-22 DIAGNOSIS — Z1211 Encounter for screening for malignant neoplasm of colon: Secondary | ICD-10-CM | POA: Diagnosis not present

## 2018-02-22 DIAGNOSIS — I10 Essential (primary) hypertension: Secondary | ICD-10-CM

## 2018-02-22 DIAGNOSIS — Z6841 Body Mass Index (BMI) 40.0 and over, adult: Secondary | ICD-10-CM

## 2018-02-22 LAB — POCT URINALYSIS DIPSTICK
Bilirubin, UA: NEGATIVE
Blood, UA: NEGATIVE
Glucose, UA: NEGATIVE
Ketones, UA: NEGATIVE
Leukocytes, UA: NEGATIVE
Nitrite, UA: NEGATIVE
Protein, UA: POSITIVE — AB
Spec Grav, UA: 1.025 (ref 1.010–1.025)
Urobilinogen, UA: 0.2 E.U./dL
pH, UA: 5.5 (ref 5.0–8.0)

## 2018-02-22 NOTE — Progress Notes (Signed)
Subjective   Clayton Pena 57 y.o. male  [id number]  [id number]  Sep 13, 1960 57 y.o.     Chief Complaint  Patient presents with  . Hypertension    Hypertension  This is a chronic problem. The current episode started more than 1 year ago. The problem has been gradually improving since onset. The problem is uncontrolled. Pertinent negatives include no anxiety, headaches, malaise/fatigue, palpitations or shortness of breath. Risk factors for coronary artery disease include male gender, obesity, sedentary lifestyle, dyslipidemia and family history.    Past Medical History:  Diagnosis Date  . Angina at rest [facility])   . Coronary artery disease     Social History   Socioeconomic History  . Marital status: Married    Spouse name: Not on file  . Number of children: Not on file  . Years of education: Not on file  . Highest education level: Not on file  Occupational History  . Not on file  Social Needs  . Financial resource strain: Not on file  . Food insecurity:    Worry: Not on file    Inability: Not on file  . Transportation needs:    Medical: Not on file    Non-medical: Not on file  Tobacco Use  . Smoking status: Never Smoker  . Smokeless tobacco: Never Used  Substance and Sexual Activity  . Alcohol use: No    Comment: occasional  . Drug use: No  . Sexual activity: Not on file  Lifestyle  . Physical activity:    Days per week: Not on file    Minutes per session: Not on file  . Stress: Not on file  Relationships  . Social connections:    Talks on phone: Not on file    Gets together: Not on file    Attends religious service: Not on file    Active member of club or organization: Not on file    Attends meetings of clubs or organizations: Not on file    Relationship status: Not on file  . Intimate partner violence:    Fear of current or ex partner: Not on file    Emotionally abused: Not on file    Physically abused: Not on file    Forced sexual activity: Not  on file  Other Topics Concern  . Not on file  Social History Narrative  . Not on file      Review of Systems  Constitutional: Negative for malaise/fatigue.  Respiratory: Negative for shortness of breath.   Cardiovascular: Negative for palpitations.  Neurological: Negative for headaches.    Objective   Physical Exam  Constitutional: He is oriented to person, place, and time. He appears well-developed and well-nourished.  HENT:  Head: Normocephalic and atraumatic.  Right Ear: External ear normal.  Left Ear: External ear normal.  Nose: Nose normal.  Mouth/Throat: Oropharynx is clear and moist.  Mild cerumen noted in right ear.    Eyes: Pupils are equal, round, and reactive to light. Conjunctivae and EOM are normal.  Neck: Normal range of motion. Neck supple. No tracheal deviation present. No thyromegaly present.  Cardiovascular: Normal rate, regular rhythm, normal heart sounds and intact distal pulses. Exam reveals no friction rub.  No murmur heard. Pulmonary/Chest: Effort normal and breath sounds normal. No stridor. No respiratory distress. He has no wheezes.  Abdominal: Soft. Bowel sounds are normal. He exhibits no distension and no mass. There is no tenderness.  Musculoskeletal: Normal range of motion.  Lymphadenopathy:  He has no cervical adenopathy.  Neurological: He is alert and oriented to person, place, and time.  Skin: Skin is warm and dry.  Psychiatric: He has a normal mood and affect. His behavior is normal. Judgment and thought content normal.  Nursing note and vitals reviewed.   BP (!) 148/82 (BP Location: Left Arm, Patient Position: Sitting, Cuff Size: Large) Comment: manually  Pulse 89   Temp 98 F (36.7 C) (Oral)   Resp 18   Ht 6\' 1"  (1.854 m)   Wt (!) 325 lb (147.4 kg)   SpO2 96%   BMI 42.88 kg/m   Assessment   Encounter Diagnoses  Name Primary?  . Essential hypertension Yes  . Screen for colon cancer   . Encounter for hepatitis C screening  test for low risk patient   . Screening for HIV (human immunodeficiency virus)   . Morbid obesity with BMI of 40.0-44.9, adult ([place])   . Immunization due      Plan  1. Essential hypertension The current medical regimen is effective;  continue present plan and medications. - Urinalysis Dipstick - CBC with Differential - Comprehensive metabolic panel - Lipid Panel  2. Screen for colon cancer Cologuard ordered.  - Cologuard  3. Encounter for hepatitis C screening test for low risk patient Screening recommended  4. Screening for HIV (human immunodeficiency virus) - HIV antibody (with reflex)  5. Morbid obesity with BMI of 40.0-44.9, adult (HCC)  Exercise recommended - Lipid Panel - TSH  6. Immunization due - Tdap vaccine greater than or equal to 7yo IM   The 2018 Physical Activity Guidelines recommend the equivalent of 150 minutes per week of moderate to vigorous aerobic activity each week, with muscle-strengthening activities on two days during the week    PRESCRIPTION FOR EXERCISE  Frequency Four to five days per week  Intensity Moderate- During moderate intensity exercise, a person is too winded to sing but is not so winded they cannot talk  Time 30 minutes or a total of 150 minutes per week.   Type Brisk walking PLUS One set each of: 0 Body weight squats  10 0 Plank hold for 30 seconds   This note has been created with Surveyor, quantity. Any transcriptional errors are unintentional.

## 2018-02-22 NOTE — Patient Instructions (Signed)

## 2018-02-23 LAB — LIPID PANEL
Chol/HDL Ratio: 3.5 ratio (ref 0.0–5.0)
Cholesterol, Total: 138 mg/dL (ref 100–199)
HDL: 40 mg/dL (ref >39)
LDL Calculated: 75 mg/dL (ref 0–99)
Triglycerides: 117 mg/dL (ref 0–149)
VLDL Cholesterol Cal: 23 mg/dL (ref 5–40)

## 2018-02-23 LAB — COMPREHENSIVE METABOLIC PANEL
ALT: 54 IU/L — ABNORMAL HIGH (ref 0–44)
AST: 30 IU/L (ref 0–40)
Albumin/Globulin Ratio: 1.5 (ref 1.2–2.2)
Albumin: 4.3 g/dL (ref 3.5–5.5)
Alkaline Phosphatase: 51 IU/L (ref 39–117)
BUN/Creatinine Ratio: 14 (ref 9–20)
BUN: 14 mg/dL (ref 6–24)
Bilirubin Total: 0.7 mg/dL (ref 0.0–1.2)
CO2: 23 mmol/L (ref 20–29)
Calcium: 9.7 mg/dL (ref 8.7–10.2)
Chloride: 97 mmol/L (ref 96–106)
Creatinine, Ser: 1.03 mg/dL (ref 0.76–1.27)
GFR calc Af Amer: 92 mL/min/{1.73_m2} (ref >59)
GFR calc non Af Amer: 80 mL/min/{1.73_m2} (ref >59)
Globulin, Total: 2.8 g/dL (ref 1.5–4.5)
Glucose: 125 mg/dL — ABNORMAL HIGH (ref 65–99)
Potassium: 3.6 mmol/L (ref 3.5–5.2)
Sodium: 137 mmol/L (ref 134–144)
Total Protein: 7.1 g/dL (ref 6.0–8.5)

## 2018-02-23 LAB — CBC WITH DIFFERENTIAL/PLATELET
Basophils Absolute: 0.1 10*3/uL (ref 0.0–0.2)
Basos: 1 %
EOS (ABSOLUTE): 0.2 10*3/uL (ref 0.0–0.4)
Eos: 2 %
Hematocrit: 51 % (ref 37.5–51.0)
Hemoglobin: 16.5 g/dL (ref 13.0–17.7)
Immature Grans (Abs): 0.1 10*3/uL (ref 0.0–0.1)
Immature Granulocytes: 1 %
Lymphocytes Absolute: 1.9 10*3/uL (ref 0.7–3.1)
Lymphs: 16 %
MCH: 28.6 pg (ref 26.6–33.0)
MCHC: 32.4 g/dL (ref 31.5–35.7)
MCV: 88 fL (ref 79–97)
Monocytes Absolute: 0.9 10*3/uL (ref 0.1–0.9)
Monocytes: 8 %
Neutrophils Absolute: 8.6 10*3/uL — ABNORMAL HIGH (ref 1.4–7.0)
Neutrophils: 72 %
Platelets: 262 10*3/uL (ref 150–450)
RBC: 5.77 x10E6/uL (ref 4.14–5.80)
RDW: 12.8 % (ref 12.3–15.4)
WBC: 11.8 10*3/uL — ABNORMAL HIGH (ref 3.4–10.8)

## 2018-02-23 LAB — TSH: TSH: 2.69 u[IU]/mL (ref 0.450–4.500)

## 2018-02-23 LAB — HIV ANTIBODY (ROUTINE TESTING W REFLEX): HIV Screen 4th Generation wRfx: NONREACTIVE

## 2018-02-24 ENCOUNTER — Other Ambulatory Visit: Payer: Self-pay | Admitting: Family Medicine

## 2018-02-24 DIAGNOSIS — Z7901 Long term (current) use of anticoagulants: Secondary | ICD-10-CM

## 2018-02-26 ENCOUNTER — Encounter: Payer: Self-pay | Admitting: Family Medicine

## 2018-02-26 NOTE — Addendum Note (Signed)
Addended by: Genelle Bal on: 02/26/2018 11:19 PM   Modules accepted: Orders

## 2018-02-26 NOTE — Progress Notes (Signed)
Protein in the urine. I would like to have the patient to increase his fluids and return for repeat UA in 1 week. If still + protein, will need to add lisinopril to his regimen.

## 2018-02-27 ENCOUNTER — Other Ambulatory Visit: Payer: Self-pay

## 2018-02-27 ENCOUNTER — Telehealth: Payer: Self-pay

## 2018-02-27 DIAGNOSIS — R062 Wheezing: Secondary | ICD-10-CM

## 2018-02-27 DIAGNOSIS — R0600 Dyspnea, unspecified: Secondary | ICD-10-CM

## 2018-02-27 DIAGNOSIS — R053 Chronic cough: Secondary | ICD-10-CM

## 2018-02-27 DIAGNOSIS — Z8719 Personal history of other diseases of the digestive system: Secondary | ICD-10-CM

## 2018-02-27 DIAGNOSIS — Z9889 Other specified postprocedural states: Secondary | ICD-10-CM

## 2018-02-27 DIAGNOSIS — R05 Cough: Secondary | ICD-10-CM

## 2018-02-27 DIAGNOSIS — I10 Essential (primary) hypertension: Secondary | ICD-10-CM

## 2018-02-27 DIAGNOSIS — Z7901 Long term (current) use of anticoagulants: Secondary | ICD-10-CM

## 2018-02-27 MED ORDER — HYDROCHLOROTHIAZIDE 25 MG PO TABS: 25.0000 mg | ORAL_TABLET | Freq: Every day | ORAL | 5 refills | Status: DC

## 2018-02-27 MED ORDER — FERROUS SULFATE 325 (65 FE) MG PO TABS: 325.0000 mg | ORAL_TABLET | Freq: Every day | ORAL | 3 refills | Status: DC

## 2018-02-27 MED ORDER — ALBUTEROL SULFATE HFA 108 (90 BASE) MCG/ACT IN AERS: 2.0000 | INHALATION_SPRAY | Freq: Four times a day (QID) | RESPIRATORY_TRACT | 0 refills | Status: DC | PRN

## 2018-02-27 MED ORDER — POLYETHYLENE GLYCOL 3350 17 GM/SCOOP PO POWD: 1.0000 | Freq: Once | ORAL | 2 refills | Status: AC

## 2018-02-27 MED ORDER — APIXABAN 5 MG PO TABS: 5.0000 mg | ORAL_TABLET | Freq: Two times a day (BID) | ORAL | 5 refills | Status: DC

## 2018-02-27 MED ORDER — ZINC GLUCONATE 50 MG PO TABS: 50.0000 mg | ORAL_TABLET | Freq: Every day | ORAL | 3 refills | Status: AC

## 2018-02-27 NOTE — Telephone Encounter (Signed)
Called, no answer. Left a message for patient to call back. Thanks!  

## 2018-02-27 NOTE — Telephone Encounter (Signed)
-----   Message from Lanae Boast, [place] sent at 02/26/2018 11:19 PM EDT ----- Protein in the urine. I would like to have the patient to increase his fluids and return for repeat UA in 1 week. If still + protein, will need to add lisinopril to his regimen.

## 2018-03-06 ENCOUNTER — Other Ambulatory Visit (INDEPENDENT_AMBULATORY_CARE_PROVIDER_SITE_OTHER): Payer: Medicaid Other

## 2018-03-06 DIAGNOSIS — I10 Essential (primary) hypertension: Secondary | ICD-10-CM | POA: Diagnosis not present

## 2018-03-06 LAB — POCT URINALYSIS DIPSTICK
Bilirubin, UA: NEGATIVE
Blood, UA: NEGATIVE
Glucose, UA: NEGATIVE
Ketones, UA: NEGATIVE
Leukocytes, UA: NEGATIVE
Nitrite, UA: NEGATIVE
Protein, UA: NEGATIVE
Spec Grav, UA: 1.025 (ref 1.010–1.025)
Urobilinogen, UA: 0.2 E.U./dL
pH, UA: 5.5 (ref 5.0–8.0)

## 2018-05-15 ENCOUNTER — Telehealth: Payer: Self-pay

## 2018-05-15 DIAGNOSIS — I10 Essential (primary) hypertension: Secondary | ICD-10-CM

## 2018-05-15 DIAGNOSIS — R062 Wheezing: Secondary | ICD-10-CM

## 2018-05-15 DIAGNOSIS — R0600 Dyspnea, unspecified: Secondary | ICD-10-CM

## 2018-05-15 DIAGNOSIS — R05 Cough: Secondary | ICD-10-CM

## 2018-05-15 DIAGNOSIS — R053 Chronic cough: Secondary | ICD-10-CM

## 2018-05-15 DIAGNOSIS — Z7901 Long term (current) use of anticoagulants: Secondary | ICD-10-CM

## 2018-05-15 DIAGNOSIS — M79605 Pain in left leg: Secondary | ICD-10-CM

## 2018-05-15 DIAGNOSIS — J309 Allergic rhinitis, unspecified: Secondary | ICD-10-CM

## 2018-05-15 MED ORDER — ALBUTEROL SULFATE HFA 108 (90 BASE) MCG/ACT IN AERS: 2.0000 | INHALATION_SPRAY | Freq: Four times a day (QID) | RESPIRATORY_TRACT | 0 refills | Status: DC | PRN

## 2018-05-15 MED ORDER — CETIRIZINE HCL 10 MG PO TABS
10.0000 mg | ORAL_TABLET | Freq: Every day | ORAL | 11 refills | Status: DC
Start: 2018-05-15 — End: 2018-12-08

## 2018-05-15 MED ORDER — HYDROCHLOROTHIAZIDE 25 MG PO TABS: 25.0000 mg | ORAL_TABLET | Freq: Every day | ORAL | 5 refills | Status: DC

## 2018-05-15 MED ORDER — APIXABAN 5 MG PO TABS: 5.0000 mg | ORAL_TABLET | Freq: Two times a day (BID) | ORAL | 5 refills | Status: DC

## 2018-05-15 MED ORDER — FERROUS SULFATE 325 (65 FE) MG PO TABS: 325.0000 mg | ORAL_TABLET | Freq: Every day | ORAL | 3 refills | Status: DC

## 2018-05-15 NOTE — Telephone Encounter (Signed)
All meds have been sent in beside Tylenol #3. Clayton Pena, Please advise if this can be filled? He was last seen on 02/22/2018. Thanks!

## 2018-05-16 NOTE — Telephone Encounter (Signed)
What is he taking this medication for? I see an order for this for 15 pills in 06/2017.

## 2018-05-17 MED ORDER — ACETAMINOPHEN-CODEINE #3 300-30 MG PO TABS: 1.0000 | ORAL_TABLET | Freq: Three times a day (TID) | ORAL | 0 refills | Status: DC | PRN

## 2018-05-17 NOTE — Telephone Encounter (Signed)
Chronic knee pain is what he takes this for

## 2018-06-01 ENCOUNTER — Ambulatory Visit: Payer: Self-pay | Admitting: Family Medicine

## 2018-06-01 ENCOUNTER — Telehealth: Payer: Self-pay

## 2018-06-01 NOTE — Telephone Encounter (Signed)
Left a vm of appointment on 06/05/2018 at 820am.

## 2018-06-05 ENCOUNTER — Ambulatory Visit: Payer: Self-pay | Admitting: Family Medicine

## 2018-08-07 ENCOUNTER — Telehealth (INDEPENDENT_AMBULATORY_CARE_PROVIDER_SITE_OTHER): Payer: Self-pay | Admitting: Orthopaedic Surgery

## 2018-08-07 NOTE — Telephone Encounter (Signed)
Billing faxed to [place] at [facility] [phone]

## 2018-11-24 NOTE — Telephone Encounter (Signed)
Message sent to provider 

## 2018-12-08 ENCOUNTER — Telehealth: Payer: Self-pay

## 2018-12-08 DIAGNOSIS — J309 Allergic rhinitis, unspecified: Secondary | ICD-10-CM

## 2018-12-08 DIAGNOSIS — R053 Chronic cough: Secondary | ICD-10-CM

## 2018-12-08 DIAGNOSIS — Z7901 Long term (current) use of anticoagulants: Secondary | ICD-10-CM

## 2018-12-08 DIAGNOSIS — I10 Essential (primary) hypertension: Secondary | ICD-10-CM

## 2018-12-08 DIAGNOSIS — R05 Cough: Secondary | ICD-10-CM

## 2018-12-08 DIAGNOSIS — R062 Wheezing: Secondary | ICD-10-CM

## 2018-12-08 DIAGNOSIS — R0600 Dyspnea, unspecified: Secondary | ICD-10-CM

## 2018-12-08 MED ORDER — ALBUTEROL SULFATE HFA 108 (90 BASE) MCG/ACT IN AERS: 2.0000 | INHALATION_SPRAY | Freq: Four times a day (QID) | RESPIRATORY_TRACT | 0 refills | Status: DC | PRN

## 2018-12-08 MED ORDER — HYDROCHLOROTHIAZIDE 25 MG PO TABS: 25.0000 mg | ORAL_TABLET | Freq: Every day | ORAL | 5 refills | Status: DC

## 2018-12-08 MED ORDER — FERROUS SULFATE 325 (65 FE) MG PO TABS: 325.0000 mg | ORAL_TABLET | Freq: Every day | ORAL | 3 refills | Status: DC

## 2018-12-08 MED ORDER — APIXABAN 5 MG PO TABS: 5.0000 mg | ORAL_TABLET | Freq: Two times a day (BID) | ORAL | 5 refills | Status: DC

## 2018-12-08 MED ORDER — CETIRIZINE HCL 10 MG PO TABS: 10.0000 mg | ORAL_TABLET | Freq: Every day | ORAL | 11 refills | Status: DC

## 2018-12-08 NOTE — Telephone Encounter (Signed)
Refills sent in to pharmacy 

## 2018-12-19 ENCOUNTER — Telehealth: Payer: Self-pay

## 2018-12-19 NOTE — Telephone Encounter (Signed)
Called and spoke with patient for COVID 19 Screening. Patient had no risk factors and is cleared to come into office for appointment. Thanks! 

## 2018-12-20 ENCOUNTER — Ambulatory Visit (INDEPENDENT_AMBULATORY_CARE_PROVIDER_SITE_OTHER): Payer: Medicaid Other | Admitting: Family Medicine

## 2018-12-20 ENCOUNTER — Encounter: Payer: Self-pay | Admitting: Family Medicine

## 2018-12-20 ENCOUNTER — Other Ambulatory Visit: Payer: Self-pay

## 2018-12-20 VITALS — BP 136/89 | HR 83 | Temp 98.4°F | Resp 16 | Ht 73.0 in | Wt 323.0 lb

## 2018-12-20 DIAGNOSIS — R5383 Other fatigue: Secondary | ICD-10-CM

## 2018-12-20 DIAGNOSIS — Z1159 Encounter for screening for other viral diseases: Secondary | ICD-10-CM

## 2018-12-20 DIAGNOSIS — M79605 Pain in left leg: Secondary | ICD-10-CM | POA: Diagnosis not present

## 2018-12-20 DIAGNOSIS — Z6841 Body Mass Index (BMI) 40.0 and over, adult: Secondary | ICD-10-CM

## 2018-12-20 DIAGNOSIS — I10 Essential (primary) hypertension: Secondary | ICD-10-CM | POA: Diagnosis not present

## 2018-12-20 DIAGNOSIS — I2510 Atherosclerotic heart disease of native coronary artery without angina pectoris: Secondary | ICD-10-CM

## 2018-12-20 LAB — POCT URINALYSIS DIPSTICK
Bilirubin, UA: NEGATIVE
Blood, UA: NEGATIVE
Glucose, UA: NEGATIVE
Ketones, UA: NEGATIVE
Leukocytes, UA: NEGATIVE
Nitrite, UA: NEGATIVE
Protein, UA: NEGATIVE
Spec Grav, UA: 1.02 (ref 1.010–1.025)
Urobilinogen, UA: 0.2 E.U./dL
pH, UA: 5.5 (ref 5.0–8.0)

## 2018-12-20 LAB — POCT GLYCOSYLATED HEMOGLOBIN (HGB A1C): Hemoglobin A1C: 6 % — AB (ref 4.0–5.6)

## 2018-12-20 MED ORDER — ACETAMINOPHEN-CODEINE #3 300-30 MG PO TABS: 1.0000 | ORAL_TABLET | Freq: Three times a day (TID) | ORAL | 0 refills | Status: AC | PRN

## 2018-12-20 NOTE — Progress Notes (Signed)
Patient [facility]   Progress Note: General Provider: Lanae Boast, FNP  SUBJECTIVE:   Clayton Pena is a 58 y.o. male who  has a past medical history of Angina at rest [facility]) and Coronary artery disease.. Patient presents today for Hypertension; Fatigue (patient states he has "No energy" ); and Medication Refill (tylenol #3)  Patient states that he is concerned about not losing weight. He states that he is having fatigue and low energy x several months. He states that he has been trying to lose weight by eating healthier. He reports not feeling hungry and eats about 1k cals per day.    Review of Systems  Constitutional: Positive for malaise/fatigue.  HENT: Negative.   Eyes: Negative.   Respiratory: Negative.   Cardiovascular: Negative.   Gastrointestinal: Negative.   Genitourinary: Negative.   Musculoskeletal: Positive for joint pain.  Skin: Negative.   Neurological: Negative.   Psychiatric/Behavioral: Negative.      OBJECTIVE: BP 136/89 (BP Location: Right Arm, Patient Position: Sitting, Cuff Size: Large)   Pulse 83   Temp 98.4 F (36.9 C) (Oral)   Resp 16   Ht 6\' 1"  (1.854 m)   Wt (!) 323 lb (146.5 kg)   SpO2 98%   BMI 42.61 kg/m   Wt Readings from Last 3 Encounters:  12/20/18 (!) 323 lb (146.5 kg)  02/22/18 (!) 325 lb (147.4 kg)  01/18/18 (!) 331 lb (150.1 kg)     Physical Exam Vitals signs and nursing note reviewed.  Constitutional:      General: He is not in acute distress.    Appearance: Normal appearance.  HENT:     Head: Normocephalic and atraumatic.  Eyes:     Extraocular Movements: Extraocular movements intact.     Conjunctiva/sclera: Conjunctivae normal.     Pupils: Pupils are equal, round, and reactive to light.  Cardiovascular:     Rate and Rhythm: Normal rate and regular rhythm.     Heart sounds: No murmur.  Pulmonary:     Effort: Pulmonary effort is normal.     Breath sounds: Normal breath sounds.   Musculoskeletal: Normal range of motion.  Skin:    General: Skin is warm and dry.  Neurological:     Mental Status: He is alert and oriented to person, place, and time. Mental status is at baseline.  Psychiatric:        Mood and Affect: Mood normal.        Behavior: Behavior normal.        Thought Content: Thought content normal.        Judgment: Judgment normal.     ASSESSMENT/PLAN:   1. Essential hypertension - Urinalysis Dipstick - Comprehensive metabolic panel - Lipid Panel  2. Fatigue, unspecified type - Thyroid Panel With TSH - Testosterone  3. Encounter for hepatitis C screening test for low risk patient - Hepatitis C antibody, reflex  4. Left leg pain - acetaminophen-codeine (TYLENOL #3) 300-30 MG tablet; Take 1 tablet by mouth every 8 (eight) hours as needed for up to 7 days for moderate pain.  Dispense: 21 tablet; Refill: 0  5. Coronary artery disease involving native heart without angina pectoris, unspecified vessel or lesion type  6. Morbid obesity with BMI of 40.0-44.9, adult (HCC) - HgB A1c  No medication changes warranted at the present time. Encouraged walking daily as tolerated. Discussed carb modified diet.    Return in about 6 months (around 06/22/2019) for htn.  The patient was given clear instructions to go to ER or return to medical center if symptoms do not improve, worsen or new problems develop. The patient verbalized understanding and agreed with plan of care.   Ms. Doug Sou. Nathaneil Canary, FNP-BC Patient [facility] [address] [place], [place] [address] [phone]

## 2018-12-20 NOTE — Patient Instructions (Signed)
Hypertension Hypertension is another name for high blood pressure. High blood pressure forces your heart to work harder to pump blood. This can cause problems over time. There are two numbers in a blood pressure reading. There is a top number (systolic) over a bottom number (diastolic). It is best to have a blood pressure below 120/80. Healthy choices can help lower your blood pressure. You may need medicine to help lower your blood pressure if:  Your blood pressure cannot be lowered with healthy choices.  Your blood pressure is higher than 130/80. Follow these instructions at home: Eating and drinking   If directed, follow the DASH eating plan. This diet includes: ? Filling half of your plate at each meal with fruits and vegetables. ? Filling one quarter of your plate at each meal with whole grains. Whole grains include whole wheat pasta, brown rice, and whole grain bread. ? Eating or drinking low-fat dairy products, such as skim milk or low-fat yogurt. ? Filling one quarter of your plate at each meal with low-fat (lean) proteins. Low-fat proteins include fish, skinless chicken, eggs, beans, and tofu. ? Avoiding fatty meat, cured and processed meat, or chicken with skin. ? Avoiding premade or processed food.  Eat less than 1,500 mg of salt (sodium) a day.  Limit alcohol use to no more than 1 drink a day for nonpregnant women and 2 drinks a day for men. One drink equals 12 oz of beer, 5 oz of wine, or 1 oz of hard liquor. Lifestyle  Work with your doctor to stay at a healthy weight or to lose weight. Ask your doctor what the best weight is for you.  Get at least 30 minutes of exercise that causes your heart to beat faster (aerobic exercise) most days of the week. This may include walking, swimming, or biking.  Get at least 30 minutes of exercise that strengthens your muscles (resistance exercise) at least 3 days a week. This may include lifting weights or pilates.  Do not use any  products that contain nicotine or tobacco. This includes cigarettes and e-cigarettes. If you need help quitting, ask your doctor.  Check your blood pressure at home as told by your doctor.  Keep all follow-up visits as told by your doctor. This is important. Medicines  Take over-the-counter and prescription medicines only as told by your doctor. Follow directions carefully.  Do not skip doses of blood pressure medicine. The medicine does not work as well if you skip doses. Skipping doses also puts you at risk for problems.  Ask your doctor about side effects or reactions to medicines that you should watch for. Contact a doctor if:  You think you are having a reaction to the medicine you are taking.  You have headaches that keep coming back (recurring).  You feel dizzy.  You have swelling in your ankles.  You have trouble with your vision. Get help right away if:  You get a very bad headache.  You start to feel confused.  You feel weak or numb.  You feel faint.  You get very bad pain in your: ? Chest. ? Belly (abdomen).  You throw up (vomit) more than once.  You have trouble breathing. Summary  Hypertension is another name for high blood pressure.  Making healthy choices can help lower blood pressure. If your blood pressure cannot be controlled with healthy choices, you may need to take medicine. This information is not intended to replace advice given to you by your health care   provider. Make sure you discuss any questions you have with your health care provider. Document Released: 12/29/2007 Document Revised: 06/09/2016 Document Reviewed: 06/09/2016 Elsevier Interactive Patient Education  2019 [place] Maintenance, Male A healthy lifestyle and preventive care is important for your health and wellness. Ask your health care provider about what schedule of regular examinations is right for you. What should I know about weight and diet? Eat a Healthy Diet   Eat plenty of vegetables, fruits, whole grains, low-fat dairy products, and lean protein.  Do not eat a lot of foods high in solid fats, added sugars, or salt.  Maintain a Healthy Weight Regular exercise can help you achieve or maintain a healthy weight. You should:  Do at least 150 minutes of exercise each week. The exercise should increase your heart rate and make you sweat (moderate-intensity exercise).  Do strength-training exercises at least twice a week. Watch Your Levels of Cholesterol and Blood Lipids  Have your blood tested for lipids and cholesterol every 5 years starting at 60 years of age. If you are at high risk for heart disease, you should start having your blood tested when you are 59 years old. You may need to have your cholesterol levels checked more often if: ? Your lipid or cholesterol levels are high. ? You are older than 61 years of age. ? You are at high risk for heart disease. What should I know about cancer screening? Many types of cancers can be detected early and may often be prevented. Lung Cancer  You should be screened every year for lung cancer if: ? You are a current smoker who has smoked for at least 30 years. ? You are a former smoker who has quit within the past 15 years.  Talk to your health care provider about your screening options, when you should start screening, and how often you should be screened. Colorectal Cancer  Routine colorectal cancer screening usually begins at 60 years of age and should be repeated every 5-10 years until you are 60 years old. You may need to be screened more often if early forms of precancerous polyps or small growths are found. Your health care provider may recommend screening at an earlier age if you have risk factors for colon cancer.  Your health care provider may recommend using home test kits to check for hidden blood in the stool.  A small camera at the end of a tube can be used to examine your colon  (sigmoidoscopy or colonoscopy). This checks for the earliest forms of colorectal cancer. Prostate and Testicular Cancer  Depending on your age and overall health, your health care provider may do certain tests to screen for prostate and testicular cancer.  Talk to your health care provider about any symptoms or concerns you have about testicular or prostate cancer. Skin Cancer  Check your skin from head to toe regularly.  Tell your health care provider about any new moles or changes in moles, especially if: ? There is a change in a mole's size, shape, or color. ? You have a mole that is larger than a pencil eraser.  Always use sunscreen. Apply sunscreen liberally and repeat throughout the day.  Protect yourself by wearing long sleeves, pants, a wide-brimmed hat, and sunglasses when outside. What should I know about heart disease, diabetes, and high blood pressure?  If you are 63-31 years of age, have your blood pressure checked every 3-5 years. If you are 47 years of age or older,  have your blood pressure checked every year. You should have your blood pressure measured twice-once when you are at a hospital or clinic, and once when you are not at a hospital or clinic. Record the average of the two measurements. To check your blood pressure when you are not at a hospital or clinic, you can use: ? An automated blood pressure machine at a pharmacy. ? A home blood pressure monitor.  Talk to your health care provider about your target blood pressure.  If you are between 87-71 years old, ask your health care provider if you should take aspirin to prevent heart disease.  Have regular diabetes screenings by checking your fasting blood sugar level. ? If you are at a normal weight and have a low risk for diabetes, have this test once every three years after the age of 71. ? If you are overweight and have a high risk for diabetes, consider being tested at a younger age or more often.  A one-time  screening for abdominal aortic aneurysm (AAA) by ultrasound is recommended for men aged 59-75 years who are current or former smokers. What should I know about preventing infection? Hepatitis B If you have a higher risk for hepatitis B, you should be screened for this virus. Talk with your health care provider to find out if you are at risk for hepatitis B infection. Hepatitis C Blood testing is recommended for:  Everyone born from [birth year] through 1967.  Anyone with known risk factors for hepatitis C. Sexually Transmitted Diseases (STDs)  You should be screened each year for STDs including gonorrhea and chlamydia if: ? You are sexually active and are younger than 60 years of age. ? You are older than 60 years of age and your health care provider tells you that you are at risk for this type of infection. ? Your sexual activity has changed since you were last screened and you are at an increased risk for chlamydia or gonorrhea. Ask your health care provider if you are at risk.  Talk with your health care provider about whether you are at high risk of being infected with HIV. Your health care provider may recommend a prescription medicine to help prevent HIV infection. What else can I do?  Schedule regular health, dental, and eye exams.  Stay current with your vaccines (immunizations).  Do not use any tobacco products, such as cigarettes, chewing tobacco, and e-cigarettes. If you need help quitting, ask your health care provider.  Limit alcohol intake to no more than 2 drinks per day. One drink equals 12 ounces of beer, 5 ounces of wine, or 1 ounces of hard liquor.  Do not use street drugs.  Do not share needles.  Ask your health care provider for help if you need support or information about quitting drugs.  Tell your health care provider if you often feel depressed.  Tell your health care provider if you have ever been abused or do not feel safe at home. This information is not  intended to replace advice given to you by your health care provider. Make sure you discuss any questions you have with your health care provider. Document Released: 01/08/2008 Document Revised: 03/10/2016 Document Reviewed: 04/15/2015 Elsevier Interactive Patient Education  2019 [employer].

## 2018-12-21 LAB — COMPREHENSIVE METABOLIC PANEL
ALT: 50 IU/L — ABNORMAL HIGH (ref 0–44)
AST: 30 IU/L (ref 0–40)
Albumin/Globulin Ratio: 1.6 (ref 1.2–2.2)
Albumin: 4.4 g/dL (ref 3.8–4.9)
Alkaline Phosphatase: 54 IU/L (ref 39–117)
BUN/Creatinine Ratio: 16 (ref 9–20)
BUN: 17 mg/dL (ref 6–24)
Bilirubin Total: 0.6 mg/dL (ref 0.0–1.2)
CO2: 21 mmol/L (ref 20–29)
Calcium: 9.6 mg/dL (ref 8.7–10.2)
Chloride: 98 mmol/L (ref 96–106)
Creatinine, Ser: 1.07 mg/dL (ref 0.76–1.27)
GFR calc Af Amer: 87 mL/min/{1.73_m2} (ref >59)
GFR calc non Af Amer: 76 mL/min/{1.73_m2} (ref >59)
Globulin, Total: 2.7 g/dL (ref 1.5–4.5)
Glucose: 110 mg/dL — ABNORMAL HIGH (ref 65–99)
Potassium: 4.1 mmol/L (ref 3.5–5.2)
Sodium: 137 mmol/L (ref 134–144)
Total Protein: 7.1 g/dL (ref 6.0–8.5)

## 2018-12-21 LAB — LIPID PANEL
Chol/HDL Ratio: 3.2 ratio (ref 0.0–5.0)
Cholesterol, Total: 122 mg/dL (ref 100–199)
HDL: 38 mg/dL — ABNORMAL LOW (ref >39)
LDL Calculated: 62 mg/dL (ref 0–99)
Triglycerides: 110 mg/dL (ref 0–149)
VLDL Cholesterol Cal: 22 mg/dL (ref 5–40)

## 2018-12-21 LAB — HEPATITIS C ANTIBODY (REFLEX): HCV Ab: <0.1 s/co ratio (ref 0.0–0.9)

## 2018-12-21 LAB — THYROID PANEL WITH TSH
Free Thyroxine Index: 3 (ref 1.2–4.9)
T3 Uptake Ratio: 27 % (ref 24–39)
T4, Total: 11 ug/dL (ref 4.5–12.0)
TSH: 2.55 u[IU]/mL (ref 0.450–4.500)

## 2018-12-21 LAB — HCV COMMENT:

## 2018-12-21 LAB — TESTOSTERONE: Testosterone: 264 ng/dL (ref 264–916)

## 2019-04-04 ENCOUNTER — Encounter (HOSPITAL_COMMUNITY): Payer: Self-pay | Admitting: *Deleted

## 2019-04-04 ENCOUNTER — Encounter (HOSPITAL_COMMUNITY): Payer: Self-pay

## 2019-04-17 ENCOUNTER — Ambulatory Visit (HOSPITAL_COMMUNITY)
Admission: RE | Admit: 2019-04-17 | Discharge: 2019-04-17 | Disposition: A | Discharge status: Home / Self Care | Payer: Medicaid Other | Source: Ambulatory Visit | Attending: Family Medicine | Admitting: Family Medicine

## 2019-04-17 ENCOUNTER — Telehealth: Payer: Self-pay

## 2019-04-17 ENCOUNTER — Other Ambulatory Visit: Payer: Self-pay

## 2019-04-17 ENCOUNTER — Ambulatory Visit (INDEPENDENT_AMBULATORY_CARE_PROVIDER_SITE_OTHER): Payer: Medicaid Other | Admitting: Family Medicine

## 2019-04-17 ENCOUNTER — Encounter: Payer: Self-pay | Admitting: Family Medicine

## 2019-04-17 VITALS — BP 153/88 | HR 78 | Temp 98.1°F | Ht 73.0 in | Wt 340.2 lb

## 2019-04-17 DIAGNOSIS — M79651 Pain in right thigh: Secondary | ICD-10-CM

## 2019-04-17 DIAGNOSIS — Z6841 Body Mass Index (BMI) 40.0 and over, adult: Secondary | ICD-10-CM

## 2019-04-17 DIAGNOSIS — I2510 Atherosclerotic heart disease of native coronary artery without angina pectoris: Secondary | ICD-10-CM

## 2019-04-17 DIAGNOSIS — Z86718 Personal history of other venous thrombosis and embolism: Secondary | ICD-10-CM | POA: Diagnosis not present

## 2019-04-17 DIAGNOSIS — I10 Essential (primary) hypertension: Secondary | ICD-10-CM

## 2019-04-17 DIAGNOSIS — S60429A Blister (nonthermal) of unspecified finger, initial encounter: Secondary | ICD-10-CM

## 2019-04-17 DIAGNOSIS — L089 Local infection of the skin and subcutaneous tissue, unspecified: Secondary | ICD-10-CM | POA: Diagnosis not present

## 2019-04-17 DIAGNOSIS — Z7901 Long term (current) use of anticoagulants: Secondary | ICD-10-CM

## 2019-04-17 LAB — POCT URINALYSIS DIPSTICK
Bilirubin, UA: NEGATIVE
Blood, UA: NEGATIVE
Glucose, UA: NEGATIVE
Ketones, UA: NEGATIVE
Leukocytes, UA: NEGATIVE
Nitrite, UA: NEGATIVE
Protein, UA: NEGATIVE
Spec Grav, UA: 1.025 (ref 1.010–1.025)
Urobilinogen, UA: 0.2 E.U./dL
pH, UA: 5 (ref 5.0–8.0)

## 2019-04-17 MED ORDER — FERROUS SULFATE 325 (65 FE) MG PO TABS: 325.0000 mg | ORAL_TABLET | Freq: Every day | ORAL | 3 refills | Status: DC

## 2019-04-17 MED ORDER — BACITRACIN 500 UNIT/GM EX OINT: 1.0000 "application " | TOPICAL_OINTMENT | Freq: Two times a day (BID) | CUTANEOUS | 0 refills | Status: DC

## 2019-04-17 MED ORDER — APIXABAN 5 MG PO TABS: 5.0000 mg | ORAL_TABLET | Freq: Two times a day (BID) | ORAL | 5 refills | Status: DC

## 2019-04-17 MED ORDER — DOXYCYCLINE HYCLATE 100 MG PO TABS: 100.0000 mg | ORAL_TABLET | Freq: Two times a day (BID) | ORAL | 0 refills | Status: DC

## 2019-04-17 MED ORDER — HYDROCHLOROTHIAZIDE 25 MG PO TABS: 25.0000 mg | ORAL_TABLET | Freq: Every day | ORAL | 5 refills | Status: DC

## 2019-04-17 NOTE — Progress Notes (Signed)
Established Patient Office Visit  Subjective:  Patient ID: Clayton Pena, male    DOB: 27-Jul-1960  Age: 58 y.o. MRN: [id number]  CC: No chief complaint on file.   HPI Clayton Pena presents for for a 1-month follow-up of hypertension, weight gain, and blister on left third finger.  Patient has a history of hypertension.  Patient has been taking hydrochlorothiazide consistently.  However, patient did not take medication prior to this appointment.  Patient states that he typically walks for exercise and follows a low calorie diet.  He typically eats 1-2 times per day.  He states that he has been gaining weight over the last several months despite diet and exercise.  Patient cardiac risk factors include morbid obesity, male gender, history of cardiovascular disease, and history of hypertriglyceridemia.  Patient denies chest pain, shortness of breath, heart palpitations,, nausea, vomiting, or diarrhea.  Patient is complaining of 2 left third finger over the past month.  He states that area is worsened and has ongoing bleeding without a pressure dressing.  He endorses tenderness.  Pain intensity is 3-4/10.  He says that he has been applying over-the-counter Neosporin without relief  Past Medical History:  Diagnosis Date  . Ang[facility]   . Coronary artery disease     Past Surgical History:  Procedure Laterality Date  . BACK SURGERY    . CORONARY ANGIOPLASTY    . HERNIA REPAIR      Family History  Problem Relation Age of Onset  . CAD Mother   . Diabetes Mother   . CAD Father   . Prostate cancer Father   . Diabetes Brother   . Stroke Neg Hx     Social History   Socioeconomic History  . Marital status: Married    Spouse name: Not on file  . Number of children: Not on file  . Years of education: Not on file  . Highest education level: Not on file  Occupational History  . Not on file  Social Needs  . Financial resource strain: Not on file  . Food insecurity    Worry:  Not on file    Inability: Not on file  . Transportation needs    Medical: Not on file    Non-medical: Not on file  Tobacco Use  . Smoking status: Never Smoker  . Smokeless tobacco: Never Used  Substance and Sexual Activity  . Alcohol use: No    Comment: occasional  . Drug use: No  . Sexual activity: Not on file  Lifestyle  . Physical activity    Days per week: Not on file    Minutes per session: Not on file  . Stress: Not on file  Relationships  . Social Herbalist on phone: Not on file    Gets together: Not on file    Attends religious service: Not on file    Active member of club or organization: Not on file    Attends meetings of clubs or organizations: Not on file    Relationship status: Not on file  . Intimate partner violence    Fear of current or ex partner: Not on file    Emotionally abused: Not on file    Physically abused: Not on file    Forced sexual activity: Not on file  Other Topics Concern  . Not on file  Social History Narrative  . Not on file    Outpatient Medications Prior to Visit  Medication Sig Dispense  Refill  . albuterol (VENTOLIN HFA) 108 (90 Base) MCG/ACT inhaler Inhale 2 puffs into the lungs every 6 (six) hours as needed for wheezing or shortness of breath. 1 Inhaler 0  . apixaban (ELIQUIS) 5 MG TABS tablet Take 1 tablet (5 mg total) by mouth 2 (two) times daily. 60 tablet 5  . cetirizine (ZYRTEC) 10 MG tablet Take 1 tablet (10 mg total) by mouth at bedtime. 30 tablet 11  . Elastic Bandages & Supports (MEDICAL COMPRESSION STOCKINGS) MISC 1 each by Does not apply route daily. 1 each 2  . ferrous sulfate 325 (65 FE) MG tablet Take 1 tablet (325 mg total) by mouth daily with breakfast. 30 tablet 3  . hydrochlorothiazide (HYDRODIURIL) 25 MG tablet Take 1 tablet (25 mg total) by mouth daily. 30 tablet 5  . zinc gluconate 50 MG tablet Take 1 tablet (50 mg total) by mouth daily. 30 tablet 3   No facility-administered medications prior to  visit.     Allergies  Allergen Reactions  . Penicillins Anaphylaxis    Has patient had a PCN reaction causing immediate rash, facial/tongue/throat swelling, SOB or lightheadedness with hypotension: unknown Has patient had a PCN reaction causing severe rash involving mucus membranes or skin necrosis: unknown Has patient had a PCN reaction that required hospitalization: unknown Has patient had a PCN reaction occurring within the last 10 years: no If all of the above answers are "NO", then may proceed with Cephalosporin use.   . Demerol [Meperidine] Other (See Comments)    REACTION:  Increases blood pressure    ROS Review of Systems  Constitutional: Positive for fatigue and unexpected weight change. Negative for activity change and appetite change.  Respiratory: Negative for shortness of breath.   Cardiovascular: Negative for chest pain and leg swelling.  Gastrointestinal: Negative for abdominal distention and abdominal pain.  Genitourinary: Negative for dysuria.  Musculoskeletal: Positive for arthralgias.  Skin: Negative.   Neurological: Negative for dizziness, facial asymmetry, weakness and light-headedness.  Psychiatric/Behavioral: Negative.       Objective:    Physical Exam  There were no vitals taken for this visit. Wt Readings from Last 3 Encounters:  12/20/18 (!) 323 lb (146.5 kg)  02/22/18 (!) 325 lb (147.4 kg)  01/18/18 (!) 331 lb (150.1 kg)     Health Maintenance Due  Topic Date Due  . COLONOSCOPY  03/04/2009    There are no preventive care reminders to display for this patient.  Lab Results  Component Value Date   TSH 2.550 12/20/2018   Lab Results  Component Value Date   WBC 11.8 (H) 02/22/2018   HGB 16.5 02/22/2018   HCT 51.0 02/22/2018   MCV 88 02/22/2018   PLT 262 02/22/2018   Lab Results  Component Value Date   NA 137 12/20/2018   K 4.1 12/20/2018   CO2 21 12/20/2018   GLUCOSE 110 (H) 12/20/2018   BUN 17 12/20/2018   CREATININE 1.07  12/20/2018   BILITOT 0.6 12/20/2018   ALKPHOS 54 12/20/2018   AST 30 12/20/2018   ALT 50 (H) 12/20/2018   PROT 7.1 12/20/2018   ALBUMIN 4.4 12/20/2018   CALCIUM 9.6 12/20/2018   ANIONGAP 10 04/30/2017   Lab Results  Component Value Date   CHOL 122 12/20/2018   Lab Results  Component Value Date   HDL 38 (L) 12/20/2018   Lab Results  Component Value Date   LDLCALC 62 12/20/2018   Lab Results  Component Value Date   TRIG 110  12/20/2018   Lab Results  Component Value Date   CHOLHDL 3.2 12/20/2018   Lab Results  Component Value Date   HGBA1C 6.0 (A) 12/20/2018      Assessment & Plan:   Problem List Items Addressed This Visit    None      Essential hypertension Blood pressure has been controlled on current medication regimen.  No changes warranted on today. - POCT Urinalysis Dipstick - CBC - Basic Metabolic Panel  Morbid obesity with BMI of 40.0-44.9, adult (HCC) Last Weight  Most recent update: 04/17/2019  8:39 AM   Weight  154.3 kg (340 lb 3.2 oz)            Discussed diet and exercise regimen at length.  Goal is to lose 5% of weight within 3 months.  Discussed starting a low-fat, low carbohydrate diet 5-6 small meals throughout the day.  Patient also given written information.  He expressed understanding.  Coronary artery disease involving native heart without angina pectoris, unspecified vessel or lesion type - Continue medication, monitor blood pressure at home. Continue DASH diet. Reminder to go to the ER if any CP, SOB, nausea, dizziness, severe HA, changes vision/speech, left arm numbness and tingling and jaw pain.     History of DVT (deep vein thrombosis) Patient is currently on Eliquis for history of DVTs, will continue.  Review creatinine as results become available.  Right thigh pain Patient has right thigh pain that is worsened with weightbearing and ambulation.  He warrants venous Dopplers to rule out DVT. - VAS [place] LOWER EXTREMITY VENOUS  (DVT); Future  Blister of finger with infection, initial encounter Patient has had a blister for greater than 1 month.  Blister continues to train bloody, purulent. - bacitracin 500 UNIT/GM ointment; Apply 1 application topically 2 (two) times daily.  Dispense: 15 g; Refill: 0 - doxycycline (VIBRA-TABS) 100 MG tablet; Take 1 tablet (100 mg total) by mouth 2 (two) times daily.  Dispense: 20 tablet; Refill: 0  Doppler, right thigh  Left 3rd finger  Follow-up: Return in about 3 months (around 07/17/2019) for hypertension.    Donia Pounds  APRN, MSN, FNP-C [facility] [address] [place], [place] [address] [phone]

## 2019-04-17 NOTE — Patient Instructions (Addendum)
Body mass index is 44.88 kg/m. Filed Weights   04/17/19 0836  Weight: (!) 340 lb 3.2 oz (154.3 kg)  Goal is to lose 5% of your body weight over the next 3 months.  Recommend low-fat, low carbohydrate diet divided over 5-6 small meals throughout the day as discussed.  Increase water intake to 64 ounces per day.  Also, light exercise.  20-30 minutes, 4-5 days/week.  I will notify you with the results of your right thigh Doppler.  In the meantime continue Eliquis at current dose. For left third finger infection, will start doxycycline 100 mg twice daily for 10 days.  Also apply bacitracin ointment twice daily, keep area clean, dry, and dressing intact.   Hypertension, Adult Hypertension is another name for high blood pressure. High blood pressure forces your heart to work harder to pump blood. This can cause problems over time. There are two numbers in a blood pressure reading. There is a top number (systolic) over a bottom number (diastolic). It is best to have a blood pressure that is below 120/80. Healthy choices can help lower your blood pressure, or you may need medicine to help lower it. What are the causes? The cause of this condition is not known. Some conditions may be related to high blood pressure. What increases the risk?  Smoking.  Having type 2 diabetes mellitus, high cholesterol, or both.  Not getting enough exercise or physical activity.  Being overweight.  Having too much fat, sugar, calories, or salt (sodium) in your diet.  Drinking too much alcohol.  Having long-term (chronic) kidney disease.  Having a family history of high blood pressure.  Age. Risk increases with age.  Race. You may be at higher risk if you are African American.  Gender. Men are at higher risk than women before age 38. After age 22, women are at higher risk than men.  Having obstructive sleep apnea.  Stress. What are the signs or symptoms?  High blood pressure may not cause symptoms.  Very high blood pressure (hypertensive crisis) may cause: ? Headache. ? Feelings of worry or nervousness (anxiety). ? Shortness of breath. ? Nosebleed. ? A feeling of being sick to your stomach (nausea). ? Throwing up (vomiting). ? Changes in how you see. ? Very bad chest pain. ? Seizures. How is this treated?  This condition is treated by making healthy lifestyle changes, such as: ? Eating healthy foods. ? Exercising more. ? Drinking less alcohol.  Your health care provider may prescribe medicine if lifestyle changes are not enough to get your blood pressure under control, and if: ? Your top number is above 130. ? Your bottom number is above 80.  Your personal target blood pressure may vary. Follow these instructions at home: Eating and drinking   If told, follow the DASH eating plan. To follow this plan: ? Fill one half of your plate at each meal with fruits and vegetables. ? Fill one fourth of your plate at each meal with whole grains. Whole grains include whole-wheat pasta, brown rice, and whole-grain bread. ? Eat or drink low-fat dairy products, such as skim milk or low-fat yogurt. ? Fill one fourth of your plate at each meal with low-fat (lean) proteins. Low-fat proteins include fish, chicken without skin, eggs, beans, and tofu. ? Avoid fatty meat, cured and processed meat, or chicken with skin. ? Avoid pre-made or processed food.  Eat less than 1,500 mg of salt each day.  Do not drink alcohol if: ? Your doctor  tells you not to drink. ? You are pregnant, may be pregnant, or are planning to become pregnant.  If you drink alcohol: ? Limit how much you use to:  0-1 drink a day for women.  0-2 drinks a day for men. ? Be aware of how much alcohol is in your drink. In the [place] one drink equals one 12 oz bottle of beer (355 mL), one 5 oz glass of wine (148 mL), or one 1 oz glass of hard liquor (44 mL). Lifestyle   Work with your doctor to stay at a healthy weight or  to lose weight. Ask your doctor what the best weight is for you.  Get at least 30 minutes of exercise most days of the week. This may include walking, swimming, or biking.  Get at least 30 minutes of exercise that strengthens your muscles (resistance exercise) at least 3 days a week. This may include lifting weights or doing Pilates.  Do not use any products that contain nicotine or tobacco, such as cigarettes, e-cigarettes, and chewing tobacco. If you need help quitting, ask your doctor.  Check your blood pressure at home as told by your doctor.  Keep all follow-up visits as told by your doctor. This is important. Medicines  Take over-the-counter and prescription medicines only as told by your doctor. Follow directions carefully.  Do not skip doses of blood pressure medicine. The medicine does not work as well if you skip doses. Skipping doses also puts you at risk for problems.  Ask your doctor about side effects or reactions to medicines that you should watch for. Contact a doctor if you:  Think you are having a reaction to the medicine you are taking.  Have headaches that keep coming back (recurring).  Feel dizzy.  Have swelling in your ankles.  Have trouble with your vision. Get help right away if you:  Get a very bad headache.  Start to feel mixed up (confused).  Feel weak or numb.  Feel faint.  Have very bad pain in your: ? Chest. ? Belly (abdomen).  Throw up more than once.  Have trouble breathing. Summary  Hypertension is another name for high blood pressure.  High blood pressure forces your heart to work harder to pump blood.  For most people, a normal blood pressure is less than 120/80.  Making healthy choices can help lower blood pressure. If your blood pressure does not get lower with healthy choices, you may need to take medicine. This information is not intended to replace advice given to you by your health care provider. Make sure you discuss any  questions you have with your health care provider. Document Released: 12/29/2007 Document Revised: 03/22/2018 Document Reviewed: 03/22/2018 Elsevier Patient Education  2020 [employer] [employer].   Obesity, Adult Obesity is having too much body fat. Being obese means that your weight is more than what is healthy for you. BMI is a number that explains how much body fat you have. If you have a BMI of 30 or more, you are obese. Obesity is often caused by eating or drinking more calories than your body uses. Changing your lifestyle can help you lose weight. Obesity can cause serious health problems, such as:  Stroke.  Coronary artery disease (CAD).  Type 2 diabetes.  Some types of cancer, including cancers of the colon, breast, uterus, and gallbladder.  Osteoarthritis.  High blood pressure (hypertension).  High cholesterol.  Sleep apnea.  Gallbladder stones.  Infertility problems. What are the causes?  Eating meals each day that are high in calories, sugar, and fat.  Being born with genes that may make you more likely to become obese.  Having a medical condition that causes obesity.  Taking certain medicines.  Sitting a lot (having a sedentary lifestyle).  Not getting enough sleep.  Drinking a lot of drinks that have sugar in them. What increases the risk?  Having a family history of obesity.  Being an [place] American woman.  Being a Hispanic man.  Living in an area with limited access to: ? Romilda Garret, recreation centers, or sidewalks. ? Healthy food choices, such as grocery stores and farmers' markets. What are the signs or symptoms? The main sign is having too much body fat. How is this treated?  Treatment for this condition often includes changing your lifestyle. Treatment may include: ? Changing your diet. This may include making a healthy meal plan. ? Exercise. This may include activity that causes your heart to beat faster (aerobic exercise) and strength training.  Work with your doctor to design a program that works for you. ? Medicine to help you lose weight. This may be used if you are not able to lose 1 pound a week after 6 weeks of healthy eating and more exercise. ? Treating conditions that cause the obesity. ? Surgery. Options may include gastric banding and gastric bypass. This may be done if:  Other treatments have not helped to improve your condition.  You have a BMI of 40 or higher.  You have life-threatening health problems related to obesity. Follow these instructions at home: Eating and drinking   Follow advice from your doctor about what to eat and drink. Your doctor may tell you to: ? Limit fast food, sweets, and processed snack foods. ? Choose low-fat options. For example, choose low-fat milk instead of whole milk. ? Eat 5 or more servings of fruits or vegetables each day. ? Eat at home more often. This gives you more control over what you eat. ? Choose healthy foods when you eat out. ? Learn to read food labels. This will help you learn how much food is in 1 serving. ? Keep low-fat snacks available. ? Avoid drinks that have a lot of sugar in them. These include soda, fruit juice, iced tea with sugar, and flavored milk.  Drink enough water to keep your pee (urine) pale yellow.  Do not go on fad diets. Physical activity  Exercise often, as told by your doctor. Most adults should get up to 150 minutes of moderate-intensity exercise every week.Ask your doctor: ? What types of exercise are safe for you. ? How often you should exercise.  Warm up and stretch before being active.  Do slow stretching after being active (cool down).  Rest between times of being active. Lifestyle  Work with your doctor and a food expert (dietitian) to set a weight-loss goal that is best for you.  Limit your screen time.  Find ways to reward yourself that do not involve food.  Do not drink alcohol if: ? Your doctor tells you not to drink. ?  You are pregnant, may be pregnant, or are planning to become pregnant.  If you drink alcohol: ? Limit how much you use to:  0-1 drink a day for women.  0-2 drinks a day for men. ? Be aware of how much alcohol is in your drink. In the [place] one drink equals one 12 oz bottle of beer (355 mL), one 5 oz glass of wine (  148 mL), or one 1 oz glass of hard liquor (44 mL). General instructions  Keep a weight-loss journal. This can help you keep track of: ? The food that you eat. ? How much exercise you get.  Take over-the-counter and prescription medicines only as told by your doctor.  Take vitamins and supplements only as told by your doctor.  Think about joining a support group.  Keep all follow-up visits as told by your doctor. This is important. Contact a doctor if:  You cannot meet your weight loss goal after you have changed your diet and lifestyle for 6 weeks. Get help right away if you:  Are having trouble breathing.  Are having thoughts of harming yourself. Summary  Obesity is having too much body fat.  Being obese means that your weight is more than what is healthy for you.  Work with your doctor to set a weight-loss goal.  Get regular exercise as told by your doctor. This information is not intended to replace advice given to you by your health care provider. Make sure you discuss any questions you have with your health care provider. Document Released: 10/04/2011 Document Revised: 03/16/2018 Document Reviewed: 03/16/2018 Elsevier Patient Education  2020 [employer].

## 2019-04-17 NOTE — Telephone Encounter (Signed)
Called and spoke with patient, advised that no evidence of DVT was noted on scan today. Asked that he continue eliquis at current dosage. I advised that antibiotic would not be changed due to it being inappropriate choice for his condition. Patient verbalized understanding. Thanks!

## 2019-04-17 NOTE — Progress Notes (Signed)
Right lower extremity venous duplex completed. Refer to "CV Proc" under chart review to view preliminary results.  04/17/2019 10:41 AM Maudry Mayhew, MHA, RVT, RDCS, RDMS

## 2019-04-17 NOTE — Telephone Encounter (Signed)
-----   Message from Dorena Dew, [place] sent at 04/17/2019 11:37 AM EDT ----- Regarding: Doppler results Please inform patient that there is no evidence of DVT, continue eliquis at current dosage. Medication will be sent to pharmacy.  I will not change antibiotic to cipro because it is an inappropriate choice for cellulitis of third left finger. Doxycycline is appropriate for empiric coverage, including MRSA.   Donia Pounds  APRN, MSN, FNP-C Patient [facility] [address] [place], [place] [address] [phone]

## 2019-04-18 ENCOUNTER — Telehealth: Payer: Self-pay

## 2019-04-18 LAB — BASIC METABOLIC PANEL
BUN/Creatinine Ratio: 12 (ref 10–24)
BUN: 14 mg/dL (ref 8–27)
CO2: 19 mmol/L — ABNORMAL LOW (ref 20–29)
Calcium: 9.9 mg/dL (ref 8.6–10.2)
Chloride: 100 mmol/L (ref 96–106)
Creatinine, Ser: 1.21 mg/dL (ref 0.76–1.27)
GFR calc Af Amer: 75 mL/min/{1.73_m2} (ref >59)
GFR calc non Af Amer: 65 mL/min/{1.73_m2} (ref >59)
Glucose: 110 mg/dL — ABNORMAL HIGH (ref 65–99)
Potassium: 4.8 mmol/L (ref 3.5–5.2)
Sodium: 138 mmol/L (ref 134–144)

## 2019-04-18 LAB — CBC
Hematocrit: 55.1 % — ABNORMAL HIGH (ref 37.5–51.0)
Hemoglobin: 18.3 g/dL — ABNORMAL HIGH (ref 13.0–17.7)
MCH: 28.8 pg (ref 26.6–33.0)
MCHC: 33.2 g/dL (ref 31.5–35.7)
MCV: 87 fL (ref 79–97)
Platelets: 290 10*3/uL (ref 150–450)
RBC: 6.35 x10E6/uL — ABNORMAL HIGH (ref 4.14–5.80)
RDW: 12.5 % (ref 11.6–15.4)
WBC: 14.2 10*3/uL — ABNORMAL HIGH (ref 3.4–10.8)

## 2019-04-18 NOTE — Telephone Encounter (Signed)
-----   Message from Dorena Dew, [place] sent at 04/18/2019  6:15 AM EDT ----- Regarding: lab results Please inform patient that white blood cells are elevated, which can be indicative of infection. Remind patient of the importance of completing antibiotic for cellulitis of left 3rd finger. Schedule first available appointment if problem worsens.   Donia Pounds  APRN, MSN, FNP-C Patient [facility] [address] [place], [place] [address] [phone]

## 2019-04-18 NOTE — Telephone Encounter (Signed)
Called, no answer. Left a message that white blood cells were elevated and it is probably from the cellulitis. Asked that he take all antibiotic as directed until completed and to schedule first available appointment if problem worsens. Asked if any questions to call back to our office. Thanks!

## 2019-04-21 ENCOUNTER — Other Ambulatory Visit: Payer: Self-pay | Admitting: Family Medicine

## 2019-04-21 DIAGNOSIS — M79651 Pain in right thigh: Secondary | ICD-10-CM

## 2019-04-23 ENCOUNTER — Telehealth: Payer: Self-pay | Admitting: Family Medicine

## 2019-04-23 ENCOUNTER — Other Ambulatory Visit: Payer: Self-pay | Admitting: Family Medicine

## 2019-04-23 DIAGNOSIS — M79651 Pain in right thigh: Secondary | ICD-10-CM

## 2019-04-23 MED ORDER — ACETAMINOPHEN-CODEINE #3 300-30 MG PO TABS: 1.0000 | ORAL_TABLET | Freq: Three times a day (TID) | ORAL | 0 refills | Status: DC | PRN

## 2019-04-23 NOTE — Progress Notes (Signed)
Meds ordered this encounter  Medications  . acetaminophen-codeine (TYLENOL #3) 300-30 MG tablet    Sig: Take 1 tablet by mouth every 8 (eight) hours as needed for moderate pain.    Dispense:  20 tablet    Refill:  0    Order Specific Question:   Supervising Provider    Answer:   Tresa Garter [id number]  Reviewed PDMP prior to prescribing opiate. No inconsistencies noted.    Donia Pounds  APRN, MSN, FNP-C Patient [facility] [address] [place], [place] [address] [phone]

## 2019-04-23 NOTE — Telephone Encounter (Signed)
Natalie,  Please advise if tylenol #3 will be sent in? Thanks!

## 2019-04-23 NOTE — Telephone Encounter (Signed)
Refill request for Tylenol #3. Please advise. Thanks!

## 2019-04-23 NOTE — Telephone Encounter (Signed)
[place],  Can you please send in Tylenol #3 if possible. Thanks!

## 2019-04-24 ENCOUNTER — Other Ambulatory Visit: Payer: Self-pay | Admitting: Family Medicine

## 2019-04-24 ENCOUNTER — Ambulatory Visit (HOSPITAL_COMMUNITY)
Admission: RE | Admit: 2019-04-24 | Discharge: 2019-04-24 | Disposition: A | Discharge status: Home / Self Care | Payer: Medicaid Other | Source: Ambulatory Visit | Attending: Family Medicine | Admitting: Family Medicine

## 2019-04-24 ENCOUNTER — Other Ambulatory Visit: Payer: Self-pay

## 2019-04-24 DIAGNOSIS — S60423S Blister (nonthermal) of left middle finger, sequela: Secondary | ICD-10-CM

## 2019-04-24 DIAGNOSIS — S61203A Unspecified open wound of left middle finger without damage to nail, initial encounter: Secondary | ICD-10-CM | POA: Diagnosis not present

## 2019-04-24 DIAGNOSIS — M79645 Pain in left finger(s): Secondary | ICD-10-CM

## 2019-04-24 NOTE — Progress Notes (Signed)
Orders Placed This Encounter  Procedures  . DG Finger Middle Left    Standing Status:   Future    Standing Expiration Date:   06/23/2020    Order Specific Question:   Reason for Exam (SYMPTOM  OR DIAGNOSIS REQUIRED)    Answer:   Cellulitis of left 3rd finger    Order Specific Question:   Preferred imaging location?    Answer:   [facility]    Order Specific Question:   Radiology Contrast Protocol - do NOT remove file path    Answer:   [email or web address]     Donia Pounds  APRN, MSN, FNP-C Patient [facility] [address] [place], [place] [address] [phone]

## 2019-04-25 ENCOUNTER — Telehealth: Payer: Self-pay

## 2019-04-25 NOTE — Telephone Encounter (Signed)
Called and spoke with patient, advised that xray shows no abnormalities and that no further interventions are warranted. Asked that he keep area clean, dry and intact. Asked to call back and schedule an appointment if problem worsens. Thanks!

## 2019-04-25 NOTE — Telephone Encounter (Signed)
-----   Message from Dorena Dew, [place] sent at 04/25/2019  6:41 AM EDT ----- Regarding: xray results Please inform patient that xray shows no abnormalities. No further interventions are warranted at this time. Continue to monitor closely, keep dressing clean, dry and intact. Refrain from manipulation of open area.  If problem worsens, please schedule a first available appointment.   Donia Pounds  APRN, MSN, FNP-C Patient [facility] [address] [place], [place] [address] [phone]

## 2019-04-28 ENCOUNTER — Emergency Department (HOSPITAL_COMMUNITY): Payer: Medicaid Other

## 2019-04-28 ENCOUNTER — Other Ambulatory Visit: Payer: Self-pay

## 2019-04-28 ENCOUNTER — Encounter (HOSPITAL_COMMUNITY): Payer: Self-pay

## 2019-04-28 ENCOUNTER — Emergency Department (HOSPITAL_COMMUNITY)
Admission: EM | Admit: 2019-04-28 | Discharge: 2019-04-28 | Disposition: A | Discharge status: Home / Self Care | Payer: Medicaid Other | Attending: Emergency Medicine | Admitting: Emergency Medicine

## 2019-04-28 DIAGNOSIS — R0789 Other chest pain: Secondary | ICD-10-CM | POA: Insufficient documentation

## 2019-04-28 DIAGNOSIS — Z79899 Other long term (current) drug therapy: Secondary | ICD-10-CM | POA: Insufficient documentation

## 2019-04-28 DIAGNOSIS — Z86718 Personal history of other venous thrombosis and embolism: Secondary | ICD-10-CM | POA: Insufficient documentation

## 2019-04-28 DIAGNOSIS — R079 Chest pain, unspecified: Secondary | ICD-10-CM | POA: Diagnosis present

## 2019-04-28 DIAGNOSIS — I10 Essential (primary) hypertension: Secondary | ICD-10-CM | POA: Diagnosis not present

## 2019-04-28 DIAGNOSIS — D3500 Benign neoplasm of unspecified adrenal gland: Secondary | ICD-10-CM | POA: Diagnosis not present

## 2019-04-28 DIAGNOSIS — I2510 Atherosclerotic heart disease of native coronary artery without angina pectoris: Secondary | ICD-10-CM | POA: Diagnosis not present

## 2019-04-28 DIAGNOSIS — R030 Elevated blood-pressure reading, without diagnosis of hypertension: Secondary | ICD-10-CM | POA: Diagnosis not present

## 2019-04-28 DIAGNOSIS — R0602 Shortness of breath: Secondary | ICD-10-CM | POA: Diagnosis not present

## 2019-04-28 DIAGNOSIS — D369 Benign neoplasm, unspecified site: Secondary | ICD-10-CM | POA: Diagnosis not present

## 2019-04-28 DIAGNOSIS — Z7901 Long term (current) use of anticoagulants: Secondary | ICD-10-CM | POA: Insufficient documentation

## 2019-04-28 LAB — BASIC METABOLIC PANEL
Anion gap: 9 (ref 5–15)
BUN: 13 mg/dL (ref 6–20)
CO2: 23 mmol/L (ref 22–32)
Calcium: 8.7 mg/dL — ABNORMAL LOW (ref 8.9–10.3)
Chloride: 102 mmol/L (ref 98–111)
Creatinine, Ser: 1.06 mg/dL (ref 0.61–1.24)
GFR calc Af Amer: >60 mL/min (ref >60)
GFR calc non Af Amer: >60 mL/min (ref >60)
Glucose, Bld: 183 mg/dL — ABNORMAL HIGH (ref 70–99)
Potassium: 3.5 mmol/L (ref 3.5–5.1)
Sodium: 134 mmol/L — ABNORMAL LOW (ref 135–145)

## 2019-04-28 LAB — CBC
HCT: 50.2 % (ref 39.0–52.0)
Hemoglobin: 16.3 g/dL (ref 13.0–17.0)
MCH: 28.8 pg (ref 26.0–34.0)
MCHC: 32.5 g/dL (ref 30.0–36.0)
MCV: 88.8 fL (ref 80.0–100.0)
Platelets: 247 10*3/uL (ref 150–400)
RBC: 5.65 MIL/uL (ref 4.22–5.81)
RDW: 13 % (ref 11.5–15.5)
WBC: 13.7 10*3/uL — ABNORMAL HIGH (ref 4.0–10.5)
nRBC: 0 % (ref 0.0–0.2)

## 2019-04-28 LAB — TROPONIN I (HIGH SENSITIVITY)
Troponin I (High Sensitivity): 8 ng/L (ref <18)
Troponin I (High Sensitivity): 9 ng/L (ref <18)

## 2019-04-28 MED ORDER — SODIUM CHLORIDE (PF) 0.9 % IJ SOLN
INTRAMUSCULAR | Status: AC
  Administered 2019-04-28: 13:00:00
  Filled 2019-04-28: qty 50

## 2019-04-28 MED ORDER — SODIUM CHLORIDE 0.9% FLUSH
3.0000 mL | Freq: Once | INTRAVENOUS | Status: AC
  Administered 2019-04-28: 3 mL via INTRAVENOUS

## 2019-04-28 MED ORDER — IOHEXOL 350 MG/ML SOLN
100.0000 mL | Freq: Once | INTRAVENOUS | Status: AC | PRN
  Administered 2019-04-28: 10:00:00 100 mL via INTRAVENOUS

## 2019-04-28 NOTE — Discharge Instructions (Addendum)
You have been diagnosed today with Atypical Chest Pain, elevated blood pressure reading.  At this time there does not appear to be the presence of an emergent medical condition, however there is always the potential for conditions to change. Please read and follow the below instructions.  Please return to the Emergency Department immediately for any new or worsening symptoms. Please be sure to follow up with your Primary Care Provider within one week regarding your visit today; please call their office to schedule an appointment even if you are feeling better for a follow-up visit. As you have a history of heart problems and do not have an established cardiologist in this area you have been referred to [facility] cardiovascular group to establish care.  Call their office today to schedule a follow-up appointment. Your blood pressure was elevated today.  Please be sure to take your blood pressure medications as instructed by your primary care provider.  Go to your primary care doctor's office within 1 week for blood pressure recheck and possible medication management. Additionally your CT scan had an incidental finding of a left adrenal adenoma that is 2.7 cm in size.  Be sure to discuss this with your primary care provider this week for further evaluation.  Also a 5 mm nodule over your right lower lung was present, discussed this with your primary care provider at your next visit as well.  Get help right away if: Your chest pain is worse. You have a cough that gets worse, or you cough up blood. You have very bad (severe) pain in your belly (abdomen). You pass out (faint). You have either of these for no clear reason: Sudden chest discomfort. Sudden discomfort in your arms, back, neck, or jaw. You have shortness of breath at any time. You suddenly start to sweat, or your skin gets clammy. You feel sick to your stomach (nauseous). You throw up (vomit). You suddenly feel lightheaded or  dizzy. You feel very weak or tired. Your heart starts to beat fast, or it feels like it is skipping beats. Get a very bad headache. Start to feel mixed up (confused). Feel weak or numb. Feel faint. Have very bad pain in your: Chest. Belly (abdomen). Throw up more than once. Have trouble breathing. You have any new/concerning or worsening symptoms  Please read the additional information packets attached to your discharge summary.  Note: Portions of this text may have been transcribed using voice recognition software. Every effort was made to ensure accuracy; however, inadvertent computerized transcription errors may still be present.

## 2019-04-28 NOTE — ED Notes (Signed)
Pt ambulated to the restroom in no distress ?

## 2019-04-28 NOTE — ED Notes (Signed)
Agricultural consultant at bedside with [employer]

## 2019-04-28 NOTE — ED Notes (Signed)
Patient ambulated to X-ray 

## 2019-04-28 NOTE — ED Provider Notes (Signed)
[facility] DEPT Provider Note   CSN: [id number] Arrival date & time: 04/28/19  0505     History   Chief Complaint Chief Complaint  Patient presents with  . Chest Pain    HPI Clayton Pena is a 58 y.o. male with history of MI x 4, morbid obesity, DVT/PE on Eliquis presents today for chest pain.  Patient reports that at 9 PM last night he began having a central and left-sided chest pressure that is ongoing, has been progressively improving he has used Gas-X as well as belching to help the symptoms.  He reports pain radiates from the center of his chest to the left side is now moderate in intensity remains constant without aggravating factors.  Patient reports that with onset of pain he had both shortness of breath and diaphoresis which have resolved.  Patient reports chronic cough since last evaluated in 2011 without change.  Denies fever/chills, headache, nausea/vomiting, diarrhea, abdominal pain, fall/injury, extremity swelling/color change or any additional concerns.  Unable to obtain cardiovascular studies through EMR system as [place].    HPI  Past Medical History:  Diagnosis Date  . Angina at rest [facility])   . Coronary artery disease     Patient Active Problem List   Diagnosis Date Noted  . History of DVT (deep vein thrombosis) 04/17/2019  . Right thigh pain 04/17/2019  . Essential hypertension 05/25/2017  . Morbid obesity with BMI of 40.0-44.9, adult ([place]) 05/25/2017  . Blood pressure check 02/08/2017  . Morbid obesity ([place]) 01/12/2017  . Acute venous embolism and thrombosis of deep vessels of proximal end of left lower extremity ([place]) 07/09/2016  . CAD (coronary artery disease) 06/14/2016  . Cellulitis 06/14/2016  . Phlebitis 06/13/2016    Past Surgical History:  Procedure Laterality Date  . BACK SURGERY    . CORONARY ANGIOPLASTY    . HERNIA REPAIR          Home Medications    Prior to  Admission medications   Medication Sig Start Date End Date Taking? Authorizing Provider  acetaminophen-codeine (TYLENOL #3) 300-30 MG tablet Take 1 tablet by mouth every 8 (eight) hours as needed for moderate pain. 04/23/19  Yes Dorena Dew, FNP  albuterol (VENTOLIN HFA) 108 (90 Base) MCG/ACT inhaler Inhale 2 puffs into the lungs every 6 (six) hours as needed for wheezing or shortness of breath. 12/08/18  Yes Lanae Boast, FNP  apixaban (ELIQUIS) 5 MG TABS tablet Take 1 tablet (5 mg total) by mouth 2 (two) times daily. 04/17/19  Yes Dorena Dew, FNP  bacitracin 500 UNIT/GM ointment Apply 1 application topically 2 (two) times daily. Patient taking differently: Apply 1 application topically as needed for wound care.  04/17/19  Yes Dorena Dew, FNP  doxycycline (VIBRA-TABS) 100 MG tablet Take 1 tablet (100 mg total) by mouth 2 (two) times daily. 04/17/19  Yes Dorena Dew, FNP  ferrous sulfate 325 (65 FE) MG tablet Take 1 tablet (325 mg total) by mouth daily with breakfast. 04/17/19  Yes Dorena Dew, FNP  hydrochlorothiazide (HYDRODIURIL) 25 MG tablet Take 1 tablet (25 mg total) by mouth daily. 04/17/19  Yes Dorena Dew, FNP  ibuprofen (ADVIL) 200 MG tablet Take 600 mg by mouth every 6 (six) hours as needed for moderate pain.   Yes [provider]  polyethylene glycol (MIRALAX / GLYCOLAX) 17 g packet Take 17 g by mouth daily as needed for moderate constipation.  Yes [provider]  sodium chloride (OCEAN) 0.65 % SOLN nasal spray Place 1 spray into both nostrils as needed for congestion.   Yes [provider]  zinc gluconate 50 MG tablet Take 1 tablet (50 mg total) by mouth daily. 02/27/18  Yes Lanae Boast, FNP  cetirizine (ZYRTEC) 10 MG tablet Take 1 tablet (10 mg total) by mouth at bedtime. Patient not taking: Reported on 04/28/2019 12/08/18   Lanae Boast, FNP  Elastic Bandages & Supports ([place]) [place] 1 each by Does  not apply route daily. Patient not taking: Reported on 04/17/2019 12/07/16   Dorena Dew, FNP    Family History Family History  Problem Relation Age of Onset  . CAD Mother   . Diabetes Mother   . CAD Father   . Prostate cancer Father   . Diabetes Brother   . Stroke Neg Hx     Social History Social History   Tobacco Use  . Smoking status: Never Smoker  . Smokeless tobacco: Never Used  Substance Use Topics  . Alcohol use: No    Comment: occasional  . Drug use: No     Allergies   Penicillins, Demerol  [meperidine hcl], and Demerol [meperidine]   Review of Systems Review of Systems Ten systems are reviewed and are negative for acute change except as noted in the HPI  Physical Exam Updated Vital Signs BP (!) 171/108   Pulse 69   Temp 98.4 F (36.9 C) (Oral)   Resp (!) 24   Wt (!) 157.2 kg   SpO2 96%   BMI 45.72 kg/m   Physical Exam Constitutional:      General: He is not in acute distress.    Appearance: Normal appearance. He is well-developed. He is obese. He is not ill-appearing or diaphoretic.  HENT:     Head: Normocephalic and atraumatic.     Right Ear: External ear normal.     Left Ear: External ear normal.     Nose: Nose normal.  Eyes:     General: Vision grossly intact. Gaze aligned appropriately.     Pupils: Pupils are equal, round, and reactive to light.  Neck:     Musculoskeletal: Normal range of motion.     Trachea: Trachea and phonation normal. No tracheal deviation.  Cardiovascular:     Rate and Rhythm: Normal rate and regular rhythm.     Heart sounds: Normal heart sounds.  Pulmonary:     Effort: Pulmonary effort is normal. No respiratory distress.     Breath sounds: Normal breath sounds.  Chest:     Chest wall: No deformity, tenderness or crepitus.  Abdominal:     General: There is no distension.     Palpations: Abdomen is soft.     Tenderness: There is no abdominal tenderness. There is no guarding or rebound.  Musculoskeletal:  Normal range of motion.     Right lower leg: He exhibits no tenderness. No edema.     Left lower leg: He exhibits no tenderness. No edema.  Skin:    General: Skin is warm and dry.  Neurological:     Mental Status: He is alert.     GCS: GCS eye subscore is 4. GCS verbal subscore is 5. GCS motor subscore is 6.     Comments: Speech is clear and goal oriented, follows commands Major Cranial nerves without deficit, no facial droop Moves extremities without ataxia, coordination intact  Psychiatric:  Behavior: Behavior normal.      ED Treatments / Results  Labs (all labs ordered are listed, but only abnormal results are displayed) Labs Reviewed  BASIC METABOLIC PANEL - Abnormal; Notable for the following components:      Result Value   Sodium 134 (*)    Glucose, Bld 183 (*)    Calcium 8.7 (*)    All other components within normal limits  CBC - Abnormal; Notable for the following components:   WBC 13.7 (*)    All other components within normal limits  TROPONIN I (HIGH SENSITIVITY)  TROPONIN I (HIGH SENSITIVITY)    EKG EKG Interpretation  Date/Time:  Saturday April 28 2019 05:17:39 EDT Ventricular Rate:  93 PR Interval:    QRS Duration: 91 QT Interval:  358 QTC Calculation: 446 R Axis:   44 Text Interpretation:  Sinus rhythm Atrial premature complex Confirmed by Quintella Reichert [phone]) on 04/28/2019 7:00:59 AM   Radiology Dg Chest 2 View  Result Date: 04/28/2019 CLINICAL DATA:  Central chest pressure for 5 hours EXAM: CHEST - 2 VIEW COMPARISON:  12/28/2017 FINDINGS: The heart size and mediastinal contours are within normal limits. Both lungs are clear. The visualized skeletal structures are unremarkable. IMPRESSION: No active cardiopulmonary disease. Electronically Signed   By: Kathreen Devoid   On: 04/28/2019 06:01   Ct Angio Chest Pe W And/or Wo Contrast  Result Date: 04/28/2019 CLINICAL DATA:  Shortness of breath with left-sided chest pain. Possible pulmonary  embolism. EXAM: CT ANGIOGRAPHY CHEST WITH CONTRAST TECHNIQUE: Multidetector CT imaging of the chest was performed using the standard protocol during bolus administration of intravenous contrast. Multiplanar CT image reconstructions and MIPs were obtained to evaluate the vascular anatomy. CONTRAST:  181mL OMNIPAQUE IOHEXOL 350 MG/ML SOLN COMPARISON:  06/13/2016 FINDINGS: Cardiovascular: Mild cardiomegaly. No evidence of pulmonary embolism. Thoracic aorta is unremarkable. Remaining mediastinal structures are within normal. Mediastinum/Nodes: No mediastinal or hilar adenopathy. Remaining mediastinal structures are within normal. Lungs/Pleura: Lungs are adequately inflated without focal airspace consolidation or effusion. Couple tiny calcified granulomas over the right mid to upper lung. Stable 5 mm nodule over the right lower lobe. No new nodules identified. Airways are normal. Upper Abdomen: 2.7 cm left adrenal adenoma.  No acute findings. Musculoskeletal: Degenerative change of the spine. Review of the MIP images confirms the above findings. IMPRESSION: No acute cardiopulmonary disease and no evidence of pulmonary embolism. 5 mm nodule over the right lower lobe stable since 2017 and therefore benign. 2.7 cm left adrenal adenoma. Electronically Signed   By: Marin Olp M.D.   On: 04/28/2019 10:49    Procedures Procedures (including critical care time)  Medications Ordered in ED Medications  sodium chloride (PF) 0.9 % injection (0 mLs  Hold 04/28/19 0953)  sodium chloride flush (NS) 0.9 % injection 3 mL (3 mLs Intravenous Given 04/28/19 0712)  iohexol (OMNIPAQUE) 350 MG/ML injection 100 mL (100 mLs Intravenous Contrast Given 04/28/19 1018)     Initial Impression / Assessment and Plan / ED Course  I have reviewed the triage vital signs and the nursing notes.  Pertinent labs & imaging results that were available during my care of the patient were reviewed by me and considered in my medical decision making  (see chart for details).     EKG:  Sinus rhythm Atrial premature complex Confirmed by Quintella Reichert [phone]) on 04/28/2019 7:00:59 AM  Initial high-sensitivity troponin: 8 Delta high-sensitivity troponin: 9 CBC with leukocytosis of 13.7, patient without infectious-like symptoms, may be secondary  to pain BMP nonacute Chest x-ray:  IMPRESSION:  No active cardiopulmonary disease.   CT Angio PE:  IMPRESSION:  No acute cardiopulmonary disease and no evidence of pulmonary  embolism.    5 mm nodule over the right lower lobe stable since 2017 and  therefore benign.    2.7 cm left adrenal adenoma.  ----- Patient seen and evaluated by Dr. Ralene Bathe.  Plan of care at this time is discharge with PCP follow-up regarding visit today as well as cardiology follow-up for optimization of blood pressure.  Patient has not had his blood pressure medication today which may account for his hypertension, he has medications at home and does not need refill.  Asymptomatic at this time regarding hypertension.  Patient informed of signs and symptoms of hypertensive emergency/emergency and to return to ED immediately if they occur.  Patient does have heart score per pathway of 4, rediscussed with Dr. Ralene Bathe, patient with atypical sounding pain today and reassuring lab work including delta troponins, do not suspect ACS, dissection or other acute pathologies at this time, patient stable for outpatient referral and follow-up. - On reassessment patient is resting comfortably and in no acute distress.  States that he is feeling well and would like to go home.  Discussed plan of care and patient is agreeable, he will take blood pressure medication when he gets home and schedule follow-up appointment for pressure recheck and medication management.  Patient informed of incidental findings and he will follow-up with PCP this week regarding them.  At this time there does not appear to be any evidence of an acute emergency medical  condition and the patient appears stable for discharge with appropriate outpatient follow up. Diagnosis was discussed with patient who verbalizes understanding of care plan and is agreeable to discharge. I have discussed return precautions with patient who verbalizes understanding of return precautions. Patient encouraged to follow-up with their PCP. All questions answered.  Patient's case rediscussed with Dr. Ralene Bathe who agrees with plan to discharge with PCP and cardiology follow-up.   Note: Portions of this report may have been transcribed using voice recognition software. Every effort was made to ensure accuracy; however, inadvertent computerized transcription errors may still be present. Final Clinical Impressions(s) / ED Diagnoses   Final diagnoses:  Atypical chest pain  Elevated blood pressure reading  Adenoma    ED Discharge Orders    None       Gari Crown 04/28/19 1341    Quintella Reichert, MD 04/29/19 [phone]

## 2019-04-28 NOTE — ED Triage Notes (Signed)
Pt reports L and central chest pressure that started about 5 hours ago. States that he tried to take some GasX and other home remedies without relief. Hx of MI. Denies N/V/D/SOB or fever.

## 2019-05-02 ENCOUNTER — Other Ambulatory Visit: Payer: Self-pay | Admitting: Family Medicine

## 2019-05-07 ENCOUNTER — Telehealth: Payer: Self-pay | Admitting: Family Medicine

## 2019-05-07 DIAGNOSIS — R062 Wheezing: Secondary | ICD-10-CM

## 2019-05-07 DIAGNOSIS — R0600 Dyspnea, unspecified: Secondary | ICD-10-CM

## 2019-05-07 DIAGNOSIS — R05 Cough: Secondary | ICD-10-CM

## 2019-05-07 DIAGNOSIS — R053 Chronic cough: Secondary | ICD-10-CM

## 2019-05-07 MED ORDER — ALBUTEROL SULFATE HFA 108 (90 BASE) MCG/ACT IN AERS: 2.0000 | INHALATION_SPRAY | Freq: Four times a day (QID) | RESPIRATORY_TRACT | 3 refills | Status: DC | PRN

## 2019-05-07 NOTE — Telephone Encounter (Signed)
Refill for albuterol inhaler sent to pharmacy. Thanks!

## 2019-05-16 ENCOUNTER — Ambulatory Visit (INDEPENDENT_AMBULATORY_CARE_PROVIDER_SITE_OTHER): Payer: Medicaid Other

## 2019-05-16 ENCOUNTER — Other Ambulatory Visit: Payer: Self-pay

## 2019-05-16 DIAGNOSIS — Z23 Encounter for immunization: Secondary | ICD-10-CM

## 2019-05-27 DIAGNOSIS — H66001 Acute suppurative otitis media without spontaneous rupture of ear drum, right ear: Secondary | ICD-10-CM

## 2019-05-27 HISTORY — DX: Acute suppurative otitis media without spontaneous rupture of ear drum, right ear: H66.001

## 2019-05-28 ENCOUNTER — Ambulatory Visit (INDEPENDENT_AMBULATORY_CARE_PROVIDER_SITE_OTHER): Payer: Medicaid Other | Admitting: Family Medicine

## 2019-05-28 ENCOUNTER — Other Ambulatory Visit: Payer: Self-pay

## 2019-05-28 ENCOUNTER — Encounter: Payer: Self-pay | Admitting: Family Medicine

## 2019-05-28 ENCOUNTER — Telehealth: Payer: Self-pay | Admitting: Family Medicine

## 2019-05-28 VITALS — BP 147/74 | HR 73 | Temp 97.8°F | Ht 73.0 in | Wt 340.2 lb

## 2019-05-28 DIAGNOSIS — H66011 Acute suppurative otitis media with spontaneous rupture of ear drum, right ear: Secondary | ICD-10-CM | POA: Insufficient documentation

## 2019-05-28 DIAGNOSIS — Z09 Encounter for follow-up examination after completed treatment for conditions other than malignant neoplasm: Secondary | ICD-10-CM | POA: Diagnosis not present

## 2019-05-28 DIAGNOSIS — Z6841 Body Mass Index (BMI) 40.0 and over, adult: Secondary | ICD-10-CM | POA: Diagnosis not present

## 2019-05-28 DIAGNOSIS — E66813 Obesity, class 3: Secondary | ICD-10-CM

## 2019-05-28 DIAGNOSIS — Z0000 Encounter for general adult medical examination without abnormal findings: Secondary | ICD-10-CM

## 2019-05-28 LAB — POCT GLYCOSYLATED HEMOGLOBIN (HGB A1C): Hemoglobin A1C: 5.8 % — AB (ref 4.0–5.6)

## 2019-05-28 LAB — GLUCOSE, POCT (MANUAL RESULT ENTRY): POC Glucose: 108 mg/dl — AB (ref 70–99)

## 2019-05-28 MED ORDER — DOXYCYCLINE HYCLATE 100 MG PO TABS: 100.0000 mg | ORAL_TABLET | Freq: Two times a day (BID) | ORAL | 0 refills | Status: DC

## 2019-05-28 MED ORDER — CIPRO HC 0.2-1 % OT SUSP: 3.0000 [drp] | Freq: Two times a day (BID) | OTIC | 0 refills | Status: DC

## 2019-05-28 NOTE — Patient Instructions (Addendum)
DASH Eating Plan DASH stands for "Dietary Approaches to Stop Hypertension." The DASH eating plan is a healthy eating plan that has been shown to reduce high blood pressure (hypertension). It may also reduce your risk for type 2 diabetes, heart disease, and stroke. The DASH eating plan may also help with weight loss. What are tips for following this plan?  General guidelines  Avoid eating more than 2,300 mg (milligrams) of salt (sodium) a day. If you have hypertension, you may need to reduce your sodium intake to 1,500 mg a day.  Limit alcohol intake to no more than 1 drink a day for nonpregnant women and 2 drinks a day for men. One drink equals 12 oz of beer, 5 oz of wine, or 1 oz of hard liquor.  Work with your health care provider to maintain a healthy body weight or to lose weight. Ask what an ideal weight is for you.  Get at least 30 minutes of exercise that causes your heart to beat faster (aerobic exercise) most days of the week. Activities may include walking, swimming, or biking.  Work with your health care provider or diet and nutrition specialist (dietitian) to adjust your eating plan to your individual calorie needs. Reading food labels   Check food labels for the amount of sodium per serving. Choose foods with less than 5 percent of the Daily Value of sodium. Generally, foods with less than 300 mg of sodium per serving fit into this eating plan.  To find whole grains, look for the word "whole" as the first word in the ingredient list. Shopping  Buy products labeled as "low-sodium" or "no salt added."  Buy fresh foods. Avoid canned foods and premade or frozen meals. Cooking  Avoid adding salt when cooking. Use salt-free seasonings or herbs instead of table salt or sea salt. Check with your health care provider or pharmacist before using salt substitutes.  Do not fry foods. Cook foods using healthy methods such as baking, boiling, grilling, and broiling instead.  Cook with  heart-healthy oils, such as olive, canola, soybean, or sunflower oil. Meal planning  Eat a balanced diet that includes: ? 5 or more servings of fruits and vegetables each day. At each meal, try to fill half of your plate with fruits and vegetables. ? Up to 6-8 servings of whole grains each day. ? Less than 6 oz of lean meat, poultry, or fish each day. A 3-oz serving of meat is about the same size as a deck of cards. One egg equals 1 oz. ? 2 servings of low-fat dairy each day. ? A serving of nuts, seeds, or beans 5 times each week. ? Heart-healthy fats. Healthy fats called Omega-3 fatty acids are found in foods such as flaxseeds and coldwater fish, like sardines, salmon, and mackerel.  Limit how much you eat of the following: ? Canned or prepackaged foods. ? Food that is high in trans fat, such as fried foods. ? Food that is high in saturated fat, such as fatty meat. ? Sweets, desserts, sugary drinks, and other foods with added sugar. ? Full-fat dairy products.  Do not salt foods before eating.  Try to eat at least 2 vegetarian meals each week.  Eat more home-cooked food and less restaurant, buffet, and fast food.  When eating at a restaurant, ask that your food be prepared with less salt or no salt, if possible. What foods are recommended? The items listed may not be a complete list. Talk with your dietitian about   what dietary choices are best for you. Grains Whole-grain or whole-wheat bread. Whole-grain or whole-wheat pasta. Brown rice. Oatmeal. Quinoa. Bulgur. Whole-grain and low-sodium cereals. Pita bread. Low-fat, low-sodium crackers. Whole-wheat flour tortillas. Vegetables Fresh or frozen vegetables (raw, steamed, roasted, or grilled). Low-sodium or reduced-sodium tomato and vegetable juice. Low-sodium or reduced-sodium tomato sauce and tomato paste. Low-sodium or reduced-sodium canned vegetables. Fruits All fresh, dried, or frozen fruit. Canned fruit in natural juice (without  added sugar). Meat and other protein foods Skinless chicken or turkey. Ground chicken or turkey. Pork with fat trimmed off. Fish and seafood. Egg whites. Dried beans, peas, or lentils. Unsalted nuts, nut butters, and seeds. Unsalted canned beans. Lean cuts of beef with fat trimmed off. Low-sodium, lean deli meat. Dairy Low-fat (1%) or fat-free (skim) milk. Fat-free, low-fat, or reduced-fat cheeses. Nonfat, low-sodium ricotta or cottage cheese. Low-fat or nonfat yogurt. Low-fat, low-sodium cheese. Fats and oils Soft margarine without trans fats. Vegetable oil. Low-fat, reduced-fat, or light mayonnaise and salad dressings (reduced-sodium). Canola, safflower, olive, soybean, and sunflower oils. Avocado. Seasoning and other foods Herbs. Spices. Seasoning mixes without salt. Unsalted popcorn and pretzels. Fat-free sweets. What foods are not recommended? The items listed may not be a complete list. Talk with your dietitian about what dietary choices are best for you. Grains Baked goods made with fat, such as croissants, muffins, or some breads. Dry pasta or rice meal packs. Vegetables Creamed or fried vegetables. Vegetables in a cheese sauce. Regular canned vegetables (not low-sodium or reduced-sodium). Regular canned tomato sauce and paste (not low-sodium or reduced-sodium). Regular tomato and vegetable juice (not low-sodium or reduced-sodium). Pickles. Olives. Fruits Canned fruit in a light or heavy syrup. Fried fruit. Fruit in cream or butter sauce. Meat and other protein foods Fatty cuts of meat. Ribs. Fried meat. Bacon. Sausage. Bologna and other processed lunch meats. Salami. Fatback. Hotdogs. Bratwurst. Salted nuts and seeds. Canned beans with added salt. Canned or smoked fish. Whole eggs or egg yolks. Chicken or turkey with skin. Dairy Whole or 2% milk, cream, and half-and-half. Whole or full-fat cream cheese. Whole-fat or sweetened yogurt. Full-fat cheese. Nondairy creamers. Whipped toppings.  Processed cheese and cheese spreads. Fats and oils Butter. Stick margarine. Lard. Shortening. Ghee. Bacon fat. Tropical oils, such as coconut, palm kernel, or palm oil. Seasoning and other foods Salted popcorn and pretzels. Onion salt, garlic salt, seasoned salt, table salt, and sea salt. Worcestershire sauce. Tartar sauce. Barbecue sauce. Teriyaki sauce. Soy sauce, including reduced-sodium. Steak sauce. Canned and packaged gravies. Fish sauce. Oyster sauce. Cocktail sauce. Horseradish that you find on the shelf. Ketchup. Mustard. Meat flavorings and tenderizers. Bouillon cubes. Hot sauce and Tabasco sauce. Premade or packaged marinades. Premade or packaged taco seasonings. Relishes. Regular salad dressings. Where to find more information:  National Heart, Lung, and Blood Institute: www.nhlbi.nih.gov  American Heart Association: www.heart.org Summary  The DASH eating plan is a healthy eating plan that has been shown to reduce high blood pressure (hypertension). It may also reduce your risk for type 2 diabetes, heart disease, and stroke.  With the DASH eating plan, you should limit salt (sodium) intake to 2,300 mg a day. If you have hypertension, you may need to reduce your sodium intake to 1,500 mg a day.  When on the DASH eating plan, aim to eat more fresh fruits and vegetables, whole grains, lean proteins, low-fat dairy, and heart-healthy fats.  Work with your health care provider or diet and nutrition specialist (dietitian) to adjust your eating plan to your   individual calorie needs. This information is not intended to replace advice given to you by your health care provider. Make sure you discuss any questions you have with your health care provider. Document Released: 07/01/2011 Document Revised: 06/24/2017 Document Reviewed: 07/05/2016 Elsevier Patient Education  2020 [employer]. Otitis Media, Adult  Otitis media means that the middle ear is red and swollen (inflamed) and full of  fluid. The condition usually goes away on its own. Follow these instructions at home:  Take over-the-counter and prescription medicines only as told by your doctor.  If you were prescribed an antibiotic medicine, take it as told by your doctor. Do not stop taking the antibiotic even if you start to feel better.  Keep all follow-up visits as told by your doctor. This is important. Contact a doctor if:  You have bleeding from your nose.  There is a lump on your neck.  You are not getting better in 5 days.  You feel worse instead of better. Get help right away if:  You have pain that is not helped with medicine.  You have swelling, redness, or pain around your ear.  You get a stiff neck.  You cannot move part of your face (paralyzed).  You notice that the bone behind your ear hurts when you touch it.  You get a very bad headache. Summary  Otitis media means that the middle ear is red, swollen, and full of fluid.  This condition usually goes away on its own. In some cases, treatment may be needed.  If you were prescribed an antibiotic medicine, take it as told by your doctor. This information is not intended to replace advice given to you by your health care provider. Make sure you discuss any questions you have with your health care provider. Document Released: 12/29/2007 Document Revised: 06/24/2017 Document Reviewed: 08/02/2016 Elsevier Patient Education  [place]. Doxycycline tablets or capsules What is this medicine? DOXYCYCLINE (dox i SYE kleen) is a tetracycline antibiotic. It kills certain bacteria or stops their growth. It is used to treat many kinds of infections, like dental, skin, respiratory, and urinary tract infections. It also treats acne, Lyme disease, malaria, and certain sexually transmitted infections. This medicine may be used for other purposes; ask your health care provider or pharmacist if you have questions. COMMON BRAND NAME(S): Acticlate,  Adoxa, Adoxa CK, Adoxa Pak, Adoxa TT, Alodox, Avidoxy, Doxal, LYMEPAK, Mondoxyne NL, Monodox, Morgidox 1x, Morgidox 1x Kit, Morgidox 2x, Morgidox 2x Kit, NutriDox, Ocudox, TARGADOX, Vibra-Tabs, Vibramycin What should I tell my health care provider before I take this medicine? They need to know if you have any of these conditions:  liver disease  long exposure to sunlight like working outdoors  stomach problems like colitis  an unusual or allergic reaction to doxycycline, tetracycline antibiotics, other medicines, foods, dyes, or preservatives  pregnant or trying to get pregnant  breast-feeding How should I use this medicine? Take this medicine by mouth with a full glass of water. Follow the directions on the prescription label. It is best to take this medicine without food, but if it upsets your stomach take it with food. Take your medicine at regular intervals. Do not take your medicine more often than directed. Take all of your medicine as directed even if you think you are better. Do not skip doses or stop your medicine early. Talk to your pediatrician regarding the use of this medicine in children. While this drug may be prescribed for selected conditions, precautions do apply.  Overdosage: If you think you have taken too much of this medicine contact a poison control center or emergency room at once. NOTE: This medicine is only for you. Do not share this medicine with others. What if I miss a dose? If you miss a dose, take it as soon as you can. If it is almost time for your next dose, take only that dose. Do not take double or extra doses. What may interact with this medicine?  antacids  barbiturates  birth control pills  bismuth subsalicylate  carbamazepine  methoxyflurane  other antibiotics  phenytoin  vitamins that contain iron  warfarin This list may not describe all possible interactions. Give your health care provider a list of all the medicines, herbs,  non-prescription drugs, or dietary supplements you use. Also tell them if you smoke, drink alcohol, or use illegal drugs. Some items may interact with your medicine. What should I watch for while using this medicine? Tell your doctor or health care professional if your symptoms do not improve. Do not treat diarrhea with over the counter products. Contact your doctor if you have diarrhea that lasts more than 2 days or if it is severe and watery. Do not take this medicine just before going to bed. It may not dissolve properly when you lay down and can cause pain in your throat. Drink plenty of fluids while taking this medicine to also help reduce irritation in your throat. This medicine can make you more sensitive to the sun. Keep out of the sun. If you cannot avoid being in the sun, wear protective clothing and use sunscreen. Do not use sun lamps or tanning beds/booths. Birth control pills may not work properly while you are taking this medicine. Talk to your doctor about using an extra method of birth control. If you are being treated for a sexually transmitted infection, avoid sexual contact until you have finished your treatment. Your sexual partner may also need treatment. Avoid antacids, aluminum, calcium, magnesium, and iron products for 4 hours before and 2 hours after taking a dose of this medicine. If you are using this medicine to prevent malaria, you should still protect yourself from contact with mosquitos. Stay in screened-in areas, use mosquito nets, keep your body covered, and use an insect repellent. What side effects may I notice from receiving this medicine? Side effects that you should report to your doctor or health care professional as soon as possible:  allergic reactions like skin rash, itching or hives, swelling of the face, lips, or tongue  difficulty breathing  fever  itching in the rectal or genital area  pain on swallowing  rash, fever, and swollen lymph  nodes  redness, blistering, peeling or loosening of the skin, including inside the mouth  severe stomach pain or cramps  unusual bleeding or bruising  unusually weak or tired  yellowing of the eyes or skin Side effects that usually do not require medical attention (report to your doctor or health care professional if they continue or are bothersome):  diarrhea  loss of appetite  nausea, vomiting This list may not describe all possible side effects. Call your doctor for medical advice about side effects. You may report side effects to FDA at 1-800-FDA-1088. Where should I keep my medicine? Keep out of the reach of children. Store at room temperature, below 30 degrees C (86 degrees F). Protect from light. Keep container tightly closed. Throw away any unused medicine after the expiration date. Taking this medicine after the expiration  date can make you seriously ill. NOTE: This sheet is a summary. It may not cover all possible information. If you have questions about this medicine, talk to your doctor, pharmacist, or health care provider.  2020 Elsevier/Gold Standard (2018-10-12 13:44:53) Ciprofloxacin otic solution What is this medicine? CIPROFLOXACIN (sip roe FLOX a sin) is used to treat ear infections. This medicine may be used for other purposes; ask your health care provider or pharmacist if you have questions. COMMON BRAND NAME(S): Cetraxal What should I tell my health care provider before I take this medicine? They need to know if you have any of these conditions:  an unusual or allergic reaction to ciprofloxacin, other medicines, foods, dyes, or preservatives  pregnant or trying to get pregnant  breast-feeding How should I use this medicine? This medicine is only for use in the ears. Wash your hands with soap and water. Do not insert any object or swab into the ear canal. Gently warm the bottle by holding it in your hand for 1 to 2 minutes. Lie down on your side with the  affected ear up. Try not to touch the tip of the dropper to your ear, fingertips, or other surface. Squeeze the container gently to put the entire contents in the ear canal. Stay in this position for 30 to 60 seconds to help the drops soak into the ear. Repeat the steps for the other ear if both ears are infected. Do not use your medicine more often than directed. Finish the full course of medicine prescribed by your doctor or health care professional even if you think your condition is better. Talk to your pediatrician regarding the use of this medicine in children. While this drug may be prescribed for children as young as 1 year of age for selected conditions, precautions do apply. Overdosage: If you think you have taken too much of this medicine contact a poison control center or emergency room at once. NOTE: This medicine is only for you. Do not share this medicine with others. What if I miss a dose? If you miss a dose, use it as soon as you can. If it is almost time for your next dose, use only that dose. Do not use double or extra doses. What may interact with this medicine? Interactions are not expected. Do not use any other ear products without telling your doctor or health care professional. This list may not describe all possible interactions. Give your health care provider a list of all the medicines, herbs, non-prescription drugs, or dietary supplements you use. Also tell them if you smoke, drink alcohol, or use illegal drugs. Some items may interact with your medicine. What should I watch for while using this medicine? Tell your doctor or healthcare professional if your symptoms do not start to get better or if they get worse. It is important that you keep the infected ear(s) clean and dry. When bathing, try not to get the infected ear(s) wet. Do not go swimming unless your doctor or health care professional has told you otherwise. What side effects may I notice from receiving this  medicine? Side effects that you should report to your doctor or health care professional as soon as possible:  allergic reactions like skin rash, itching or hives, swelling of the face, lips, or tongue  burning, itching, and redness  ear pain that gets worse Side effects that usually do not require medical attention (report to your doctor or health care professional if they continue or are bothersome):  abnormal feeling in the ear  headache  unpleasant feeling while putting the drops in the ear This list may not describe all possible side effects. Call your doctor for medical advice about side effects. You may report side effects to FDA at 1-800-FDA-1088. Where should I keep my medicine? Keep out of the reach of children. Store at room temperature between 15 and 25 degrees C (59 and 77 degrees F). Do not freeze. Protect unused containers from light in foil pouch. Throw away any unused medicine after the expiration date. NOTE: This sheet is a summary. It may not cover all possible information. If you have questions about this medicine, talk to your doctor, pharmacist, or health care provider.  2020 Elsevier/Gold Standard (2008-04-16 13:39:53)

## 2019-05-28 NOTE — Telephone Encounter (Signed)
Called and spoke with patient, He is complaining of bloody discharge in ear. He denies pain or hearing problems.  We have scheduled him to come in for an appointment.

## 2019-05-28 NOTE — Telephone Encounter (Signed)
Blood in Pt ear

## 2019-05-28 NOTE — Progress Notes (Addendum)
Patient [facility]  Established Patient Office Visit  Subjective:  Patient ID: Clayton Pena, male    DOB: [birth year]  Age: 58 y.o. MRN: [id number]  CC:  Chief Complaint  Patient presents with  . Ear Pain    Noticed spots of blood since Sunday    HPI Clayton Pena is a 58 year old who presents for Follow Up today.   Past Medical History:  Diagnosis Date  . Angina at rest [facility])   . Coronary artery disease    Current Status: This will be his initial office visit with me. he was previously seeing [place] Hollis, NP for his PCP needs. Since his last office visit he has c/o bleeding from right ear X 2 days now. He states that he may have caused trauma to ear when sneezing a few days ago. He denies fevers, chills, fatigue, recent infections, weight loss, and night sweats. He has not had any headaches, visual changes, dizziness, and falls. No chest pain, heart palpitations, cough and shortness of breath reported. No reports of GI problems such as nausea, vomiting, diarrhea, and constipation. He has no reports of blood in stools, dysuria and hematuria. No depression or anxiety reported today. He denies pain today.   Past Surgical History:  Procedure Laterality Date  . BACK SURGERY    . CORONARY ANGIOPLASTY    . HERNIA REPAIR      Family History  Problem Relation Age of Onset  . CAD Mother   . Diabetes Mother   . CAD Father   . Prostate cancer Father   . Diabetes Brother   . Stroke Neg Hx     Social History   Socioeconomic History  . Marital status: Married    Spouse name: Not on file  . Number of children: Not on file  . Years of education: Not on file  . Highest education level: Not on file  Occupational History  . Not on file  Social Needs  . Financial resource strain: Not on file  . Food insecurity    Worry: Not on file    Inability: Not on file  . Transportation  needs    Medical: Not on file    Non-medical: Not on file  Tobacco Use  . Smoking status: Never Smoker  . Smokeless tobacco: Never Used  Substance and Sexual Activity  . Alcohol use: No    Comment: occasional  . Drug use: No  . Sexual activity: Not on file  Lifestyle  . Physical activity    Days per week: Not on file    Minutes per session: Not on file  . Stress: Not on file  Relationships  . Social Herbalist on phone: Not on file    Gets together: Not on file    Attends religious service: Not on file    Active member of club or organization: Not on file    Attends meetings of clubs or organizations: Not on file    Relationship status: Not on file  . Intimate partner violence    Fear of current or ex partner: Not on file    Emotionally abused: Not on file    Physically abused: Not on file    Forced sexual activity: Not on file  Other Topics Concern  . Not on file  Social History Narrative  . Not on file    Outpatient Medications  Prior to Visit  Medication Sig Dispense Refill  . acetaminophen-codeine (TYLENOL #3) 300-30 MG tablet Take 1 tablet by mouth every 8 (eight) hours as needed for moderate pain. 20 tablet 0  . albuterol (VENTOLIN HFA) 108 (90 Base) MCG/ACT inhaler Inhale 2 puffs into the lungs every 6 (six) hours as needed for wheezing or shortness of breath. 18 g 3  . apixaban (ELIQUIS) 5 MG TABS tablet Take 1 tablet (5 mg total) by mouth 2 (two) times daily. 60 tablet 5  . bacitracin 500 UNIT/GM ointment Apply 1 application topically 2 (two) times daily. (Patient taking differently: Apply 1 application topically as needed for wound care. ) 15 g 0  . cetirizine (ZYRTEC) 10 MG tablet Take 1 tablet (10 mg total) by mouth at bedtime. 30 tablet 11  . Elastic Bandages & Supports (MEDICAL COMPRESSION STOCKINGS) MISC 1 each by Does not apply route daily. 1 each 2  . ferrous sulfate 325 (65 FE) MG tablet Take 1 tablet (325 mg total) by mouth daily with breakfast.  30 tablet 3  . hydrochlorothiazide (HYDRODIURIL) 25 MG tablet Take 1 tablet (25 mg total) by mouth daily. 30 tablet 5  . sodium chloride (OCEAN) 0.65 % SOLN nasal spray Place 1 spray into both nostrils as needed for congestion.    Marland Kitchen zinc gluconate 50 MG tablet Take 1 tablet (50 mg total) by mouth daily. 30 tablet 3  . ibuprofen (ADVIL) 200 MG tablet Take 600 mg by mouth every 6 (six) hours as needed for moderate pain.    Marland Kitchen doxycycline (VIBRA-TABS) 100 MG tablet Take 1 tablet (100 mg total) by mouth 2 (two) times daily. 20 tablet 0  . polyethylene glycol (MIRALAX / GLYCOLAX) 17 g packet Take 17 g by mouth daily as needed for moderate constipation.     No facility-administered medications prior to visit.     Allergies  Allergen Reactions  . Penicillins Anaphylaxis    Has patient had a PCN reaction causing immediate rash, facial/tongue/throat swelling, SOB or lightheadedness with hypotension: unknown Has patient had a PCN reaction causing severe rash involving mucus membranes or skin necrosis: unknown Has patient had a PCN reaction that required hospitalization: unknown Has patient had a PCN reaction occurring within the last 10 years: no If all of the above answers are "NO", then may proceed with Cephalosporin use.   . Demerol  [Meperidine Hcl] Other (See Comments)    REACTION:  Increases blood pressure  . Demerol [Meperidine] Other (See Comments)    REACTION:  Increases blood pressure    ROS Review of Systems  Constitutional: Negative.   HENT: Negative.   Eyes: Negative.   Respiratory: Negative.   Cardiovascular: Negative.   Gastrointestinal: Positive for abdominal distention.  Endocrine: Negative.   Genitourinary: Negative.   Musculoskeletal: Positive for arthralgias (generalized joint pain).  Skin: Negative.   Allergic/Immunologic: Negative.   Neurological: Negative.   Psychiatric/Behavioral: Negative.       Objective:    Physical Exam  Constitutional: He is oriented  to person, place, and time. He appears well-developed and well-nourished.  HENT:  Right Ear: There is swelling and tenderness. Tympanic membrane is injected and erythematous.  Eyes: Conjunctivae are normal.  Neck: Normal range of motion. Neck supple.  Cardiovascular: Normal rate, regular rhythm, normal heart sounds and intact distal pulses.  Pulmonary/Chest: Effort normal and breath sounds normal.  Abdominal: Soft. Bowel sounds are normal. He exhibits distension (obese).  Musculoskeletal: Normal range of motion.  Neurological: He is alert  and oriented to person, place, and time. He has normal reflexes.  Skin: Skin is warm and dry.  Psychiatric: He has a normal mood and affect. His behavior is normal. Judgment and thought content normal.  Nursing note and vitals reviewed.   BP (!) 147/74   Pulse 73   Temp 97.8 F (36.6 C)   Ht 6\' 1"  (1.854 m)   Wt (!) 340 lb 3.2 oz (154.3 kg)   SpO2 98%   BMI 44.88 kg/m  Wt Readings from Last 3 Encounters:  05/28/19 (!) 340 lb 3.2 oz (154.3 kg)  04/28/19 (!) 346 lb 8 oz (157.2 kg)  04/17/19 (!) 340 lb 3.2 oz (154.3 kg)     Health Maintenance Due  Topic Date Due  . COLONOSCOPY  03/04/2009    There are no preventive care reminders to display for this patient.  Lab Results  Component Value Date   TSH 2.550 12/20/2018   Lab Results  Component Value Date   WBC 13.7 (H) 04/28/2019   HGB 16.3 04/28/2019   HCT 50.2 04/28/2019   MCV 88.8 04/28/2019   PLT 247 04/28/2019   Lab Results  Component Value Date   NA 134 (L) 04/28/2019   K 3.5 04/28/2019   CO2 23 04/28/2019   GLUCOSE 183 (H) 04/28/2019   BUN 13 04/28/2019   CREATININE 1.06 04/28/2019   BILITOT 0.6 12/20/2018   ALKPHOS 54 12/20/2018   AST 30 12/20/2018   ALT 50 (H) 12/20/2018   PROT 7.1 12/20/2018   ALBUMIN 4.4 12/20/2018   CALCIUM 8.7 (L) 04/28/2019   ANIONGAP 9 04/28/2019   Lab Results  Component Value Date   CHOL 122 12/20/2018   Lab Results  Component Value  Date   HDL 38 (L) 12/20/2018   Lab Results  Component Value Date   LDLCALC 62 12/20/2018   Lab Results  Component Value Date   TRIG 110 12/20/2018   Lab Results  Component Value Date   CHOLHDL 3.2 12/20/2018   Lab Results  Component Value Date   HGBA1C 5.8 (A) 05/28/2019   Assessment & Plan:   1. Acute suppurative otitis media of right ear with spontaneous rupture of tympanic membrane, recurrence not specified We will initiate both antibiotic ear drops, and oral antibiotics today.He is avoid swimming, or getting water into his ear. He is to use cotton ball coated with petroleum jelly.  - ciprofloxacin-hydrocortisone (CIPRO HC) OTIC suspension; Place 3 drops into the right ear 2 (two) times daily. X 10 days.  Dispense: 10 mL; Refill: 0 - doxycycline (VIBRA-TABS) 100 MG tablet; Take 1 tablet (100 mg total) by mouth 2 (two) times daily.  Dispense: 20 tablet; Refill: 0  2. Class 3 severe obesity due to excess calories with serious comorbidity and body mass index (BMI) of 40.0 to 44.9 in adult [facility]) Body mass index is 44.88 kg/m. Goal BMI  is <30. Encouraged efforts to reduce weight include engaging in physical activity as tolerated with goal of 150 minutes per week. Improve dietary choices and eat a meal regimen consistent with a Mediterranean or DASH diet. Reduce simple carbohydrates. Do not skip meals and eat healthy snacks throughout the day to avoid over-eating at dinner. Set a goal weight loss that is achievable for you.   3. Healthcare maintenance - POCT glycosylated hemoglobin (Hb A1C) - POCT glucose (manual entry)  4. Follow up He will follow up in 10 days, with [place] Hollis, NP,  for reassessment of right ear.  Meds ordered this encounter  Medications  . ciprofloxacin-hydrocortisone (CIPRO HC) OTIC suspension    Sig: Place 3 drops into the right ear 2 (two) times daily. X 10 days.    Dispense:  10 mL    Refill:  0  . doxycycline (VIBRA-TABS) 100 MG tablet    Sig:  Take 1 tablet (100 mg total) by mouth 2 (two) times daily.    Dispense:  20 tablet    Refill:  0    Orders Placed This Encounter  Procedures  . POCT glycosylated hemoglobin (Hb A1C)  . POCT glucose (manual entry)    Referral Orders  No referral(s) requested today    Kathe Becton,  MSN, FNP-BC [place], [place] [address] [phone] fax  Problem List Items Addressed This Visit    None    Visit Diagnoses    Acute suppurative otitis media of right ear with spontaneous rupture of tympanic membrane, recurrence not specified    -  Primary   Relevant Medications   ciprofloxacin-hydrocortisone (CIPRO HC) OTIC suspension   doxycycline (VIBRA-TABS) 100 MG tablet   Class 3 severe obesity due to excess calories with serious comorbidity and body mass index (BMI) of 40.0 to 44.9 in adult [facility])       Healthcare maintenance       Relevant Orders   POCT glycosylated hemoglobin (Hb A1C) (Completed)   POCT glucose (manual entry) (Completed)   Follow up          Meds ordered this encounter  Medications  . ciprofloxacin-hydrocortisone (CIPRO HC) OTIC suspension    Sig: Place 3 drops into the right ear 2 (two) times daily. X 10 days.    Dispense:  10 mL    Refill:  0  . doxycycline (VIBRA-TABS) 100 MG tablet    Sig: Take 1 tablet (100 mg total) by mouth 2 (two) times daily.    Dispense:  20 tablet    Refill:  0    Follow-up: No follow-ups on file.    Azzie Glatter, FNP

## 2019-06-12 ENCOUNTER — Ambulatory Visit (INDEPENDENT_AMBULATORY_CARE_PROVIDER_SITE_OTHER): Payer: Medicaid Other | Admitting: Family Medicine

## 2019-06-12 ENCOUNTER — Other Ambulatory Visit: Payer: Self-pay

## 2019-06-12 VITALS — BP 146/80 | HR 72 | Temp 98.2°F | Resp 16 | Ht 73.0 in | Wt 341.0 lb

## 2019-06-12 DIAGNOSIS — H9201 Otalgia, right ear: Secondary | ICD-10-CM

## 2019-06-12 DIAGNOSIS — H66011 Acute suppurative otitis media with spontaneous rupture of ear drum, right ear: Secondary | ICD-10-CM

## 2019-06-12 NOTE — Progress Notes (Signed)
Patient [facility]   Established Patient Office Visit  Subjective:  Patient ID: Clayton Pena, male    DOB: 1961-01-28  Age: 58 y.o. MRN: [id number]  CC:  Chief Complaint  Patient presents with  . Follow-up    right ear     Clayton Pena is a very pleasant 58 year old male with a medical history significant for essential hypertension, morbid obesity, history of PE on Eliquis, and chronic knee pain present for a follow up of right otitis media. Patient sustained a right TM perforation when sneezing. Patient was treated and evaluated in clinic greater than 1 week ago for this problem. He has been using Cipro-hydrocortisone drops consistently as prescribed. He says that pain has improved. He is not having pain at present. He denies ear drainage, fever, chills, sore throat, nasal congestion, or hearing loss.   Otalgia  There is pain in the right ear. The current episode started 1 to 4 weeks ago. The problem has been gradually improving. There has been no fever. The pain is at a severity of 0/10. The patient is experiencing no pain. Pertinent negatives include no coughing, ear discharge, headaches, hearing loss, neck pain, rhinorrhea or sore throat. He has tried antibiotics and ear drops for the symptoms. There is no history of a chronic ear infection, hearing loss or a tympanostomy tube.   Past Medical History:  Diagnosis Date  . Acute suppr otitis media w/o spon rupt ear drum, right ear 05/2019  . Angina at rest [facility])   . Coronary artery disease     Past Surgical History:  Procedure Laterality Date  . BACK SURGERY    . CORONARY ANGIOPLASTY    . HERNIA REPAIR      Family History  Problem Relation Age of Onset  . CAD Mother   . Diabetes Mother   . CAD Father   . Prostate cancer Father   . Diabetes Brother   . Stroke Neg Hx     Social History   Socioeconomic History  . Marital status: Married    Spouse name: Not on file  . Number of  children: Not on file  . Years of education: Not on file  . Highest education level: Not on file  Occupational History  . Not on file  Social Needs  . Financial resource strain: Not on file  . Food insecurity    Worry: Not on file    Inability: Not on file  . Transportation needs    Medical: Not on file    Non-medical: Not on file  Tobacco Use  . Smoking status: Never Smoker  . Smokeless tobacco: Never Used  Substance and Sexual Activity  . Alcohol use: No    Comment: occasional  . Drug use: No  . Sexual activity: Not on file  Lifestyle  . Physical activity    Days per week: Not on file    Minutes per session: Not on file  . Stress: Not on file  Relationships  . Social Herbalist on phone: Not on file    Gets together: Not on file    Attends religious service: Not on file    Active member of club or organization: Not on file    Attends meetings of clubs or organizations: Not on file    Relationship status: Not on file  . Intimate partner violence    Fear of current or ex partner: Not on file  Emotionally abused: Not on file    Physically abused: Not on file    Forced sexual activity: Not on file  Other Topics Concern  . Not on file  Social History Narrative  . Not on file    Outpatient Medications Prior to Visit  Medication Sig Dispense Refill  . acetaminophen-codeine (TYLENOL #3) 300-30 MG tablet Take 1 tablet by mouth every 8 (eight) hours as needed for moderate pain. 20 tablet 0  . albuterol (VENTOLIN HFA) 108 (90 Base) MCG/ACT inhaler Inhale 2 puffs into the lungs every 6 (six) hours as needed for wheezing or shortness of breath. 18 g 3  . apixaban (ELIQUIS) 5 MG TABS tablet Take 1 tablet (5 mg total) by mouth 2 (two) times daily. 60 tablet 5  . bacitracin 500 UNIT/GM ointment Apply 1 application topically 2 (two) times daily. (Patient taking differently: Apply 1 application topically as needed for wound care. ) 15 g 0  . cetirizine (ZYRTEC) 10 MG  tablet Take 1 tablet (10 mg total) by mouth at bedtime. 30 tablet 11  . Elastic Bandages & Supports (MEDICAL COMPRESSION STOCKINGS) MISC 1 each by Does not apply route daily. 1 each 2  . ferrous sulfate 325 (65 FE) MG tablet Take 1 tablet (325 mg total) by mouth daily with breakfast. 30 tablet 3  . hydrochlorothiazide (HYDRODIURIL) 25 MG tablet Take 1 tablet (25 mg total) by mouth daily. 30 tablet 5  . ibuprofen (ADVIL) 200 MG tablet Take 600 mg by mouth every 6 (six) hours as needed for moderate pain.    . sodium chloride (OCEAN) 0.65 % SOLN nasal spray Place 1 spray into both nostrils as needed for congestion.    Marland Kitchen zinc gluconate 50 MG tablet Take 1 tablet (50 mg total) by mouth daily. 30 tablet 3  . ciprofloxacin-hydrocortisone (CIPRO HC) OTIC suspension Place 3 drops into the right ear 2 (two) times daily. X 10 days. (Patient not taking: Reported on 06/12/2019) 10 mL 0  . doxycycline (VIBRA-TABS) 100 MG tablet Take 1 tablet (100 mg total) by mouth 2 (two) times daily. (Patient not taking: Reported on 06/12/2019) 20 tablet 0   No facility-administered medications prior to visit.     Allergies  Allergen Reactions  . Penicillins Anaphylaxis    Has patient had a PCN reaction causing immediate rash, facial/tongue/throat swelling, SOB or lightheadedness with hypotension: unknown Has patient had a PCN reaction causing severe rash involving mucus membranes or skin necrosis: unknown Has patient had a PCN reaction that required hospitalization: unknown Has patient had a PCN reaction occurring within the last 10 years: no If all of the above answers are "NO", then may proceed with Cephalosporin use.   . Demerol  [Meperidine Hcl] Other (See Comments)    REACTION:  Increases blood pressure  . Demerol [Meperidine] Other (See Comments)    REACTION:  Increases blood pressure    ROS Review of Systems  Constitutional: Negative for diaphoresis and fatigue.  HENT: Positive for ear pain. Negative for  ear discharge, hearing loss, rhinorrhea and sore throat.   Respiratory: Negative.  Negative for cough.   Cardiovascular: Negative.   Genitourinary: Negative.   Musculoskeletal: Negative.  Negative for neck pain.  Skin: Negative.   Allergic/Immunologic: Negative.   Neurological: Negative.  Negative for headaches.  Hematological: Negative.   Psychiatric/Behavioral: Negative.       Objective:    Physical Exam  Constitutional: He is oriented to person, place, and time. He appears well-developed and well-nourished.  HENT:  Head: Normocephalic.  Right Ear: There is tenderness. No drainage. Tympanic membrane is scarred and erythematous. No decreased hearing is noted.  Eyes: Pupils are equal, round, and reactive to light.  Cardiovascular: Normal rate and regular rhythm.  Pulmonary/Chest: Effort normal and breath sounds normal.  Abdominal: Soft. Bowel sounds are normal.  Musculoskeletal: Normal range of motion.  Neurological: He is alert and oriented to person, place, and time.  Psychiatric: He has a normal mood and affect. His behavior is normal. Judgment and thought content normal.    BP (!) 146/80 (BP Location: Left Arm, Patient Position: Sitting, Cuff Size: Large) Comment: manually  Pulse 72   Temp 98.2 F (36.8 C) (Oral)   Resp 16   Ht 6\' 1"  (1.854 m)   Wt (!) 341 lb (154.7 kg)   SpO2 96%   BMI 44.99 kg/m  Wt Readings from Last 3 Encounters:  06/12/19 (!) 341 lb (154.7 kg)  05/28/19 (!) 340 lb 3.2 oz (154.3 kg)  04/28/19 (!) 346 lb 8 oz (157.2 kg)     Health Maintenance Due  Topic Date Due  . COLONOSCOPY  03/04/2009    There are no preventive care reminders to display for this patient.  Lab Results  Component Value Date   TSH 2.550 12/20/2018   Lab Results  Component Value Date   WBC 13.7 (H) 04/28/2019   HGB 16.3 04/28/2019   HCT 50.2 04/28/2019   MCV 88.8 04/28/2019   PLT 247 04/28/2019   Lab Results  Component Value Date   NA 134 (L) 04/28/2019   K  3.5 04/28/2019   CO2 23 04/28/2019   GLUCOSE 183 (H) 04/28/2019   BUN 13 04/28/2019   CREATININE 1.06 04/28/2019   BILITOT 0.6 12/20/2018   ALKPHOS 54 12/20/2018   AST 30 12/20/2018   ALT 50 (H) 12/20/2018   PROT 7.1 12/20/2018   ALBUMIN 4.4 12/20/2018   CALCIUM 8.7 (L) 04/28/2019   ANIONGAP 9 04/28/2019   Lab Results  Component Value Date   CHOL 122 12/20/2018   Lab Results  Component Value Date   HDL 38 (L) 12/20/2018   Lab Results  Component Value Date   LDLCALC 62 12/20/2018   Lab Results  Component Value Date   TRIG 110 12/20/2018   Lab Results  Component Value Date   CHOLHDL 3.2 12/20/2018   Lab Results  Component Value Date   HGBA1C 5.8 (A) 05/28/2019      Assessment & Plan:   Problem List Items Addressed This Visit      Nervous and Auditory   Acute suppurative otitis media of right ear with spontaneous rupture of tympanic membrane - Primary    Other Visit Diagnoses    Otalgia of right ear         Acute suppurative otitis media of right ear with spontaneous rupture of tympanic membrane, recurrence not specified TM difficult to visualize due to dried blood. Patient denies any hearing loss or visible drainage. Advised to complete antibiotic and he is scheduled for a follow up for chronic conditions in a few weeks, will reassess at that time.    Otalgia of right ear Pain has improved. Continue OTC Tylenol for mild to moderate right ear pain.   Follow-up: Patient to follow up as previously scheduled.     Donia Pounds  APRN, MSN, FNP-C Patient [facility] [address] [place], [place] [address] [phone]  This note was prepared using  [employer] development officer, errors in dictation are unintentional.

## 2019-06-12 NOTE — Patient Instructions (Signed)
Otitis Media, Adult  Otitis media means that the middle ear is red and swollen (inflamed) and full of fluid. The condition usually goes away on its own. Follow these instructions at home:  Take over-the-counter and prescription medicines only as told by your doctor.  If you were prescribed an antibiotic medicine, take it as told by your doctor. Do not stop taking the antibiotic even if you start to feel better.  Keep all follow-up visits as told by your doctor. This is important. Contact a doctor if:  You have bleeding from your nose.  There is a lump on your neck.  You are not getting better in 5 days.  You feel worse instead of better. Get help right away if:  You have pain that is not helped with medicine.  You have swelling, redness, or pain around your ear.  You get a stiff neck.  You cannot move part of your face (paralyzed).  You notice that the bone behind your ear hurts when you touch it.  You get a very bad headache. Summary  Otitis media means that the middle ear is red, swollen, and full of fluid.  This condition usually goes away on its own. In some cases, treatment may be needed.  If you were prescribed an antibiotic medicine, take it as told by your doctor. This information is not intended to replace advice given to you by your health care provider. Make sure you discuss any questions you have with your health care provider. Document Released: 12/29/2007 Document Revised: 06/24/2017 Document Reviewed: 08/02/2016 Elsevier Patient Education  2020 [employer]  

## 2019-06-22 ENCOUNTER — Ambulatory Visit: Payer: Self-pay | Admitting: Family Medicine

## 2019-06-25 ENCOUNTER — Encounter: Payer: Self-pay | Admitting: Family Medicine

## 2019-06-25 ENCOUNTER — Ambulatory Visit (INDEPENDENT_AMBULATORY_CARE_PROVIDER_SITE_OTHER): Payer: Medicaid Other | Admitting: Family Medicine

## 2019-06-25 ENCOUNTER — Other Ambulatory Visit: Payer: Self-pay

## 2019-06-25 VITALS — BP 142/76 | HR 80 | Temp 98.0°F | Ht 73.2 in | Wt 339.4 lb

## 2019-06-25 DIAGNOSIS — Z6841 Body Mass Index (BMI) 40.0 and over, adult: Secondary | ICD-10-CM | POA: Diagnosis not present

## 2019-06-25 DIAGNOSIS — Z09 Encounter for follow-up examination after completed treatment for conditions other than malignant neoplasm: Secondary | ICD-10-CM

## 2019-06-25 DIAGNOSIS — Z8639 Personal history of other endocrine, nutritional and metabolic disease: Secondary | ICD-10-CM

## 2019-06-25 DIAGNOSIS — M109 Gout, unspecified: Secondary | ICD-10-CM | POA: Diagnosis not present

## 2019-06-25 DIAGNOSIS — E66813 Obesity, class 3: Secondary | ICD-10-CM | POA: Insufficient documentation

## 2019-06-25 LAB — POCT URINALYSIS DIPSTICK
Glucose, UA: NEGATIVE
Ketones, UA: NEGATIVE
Leukocytes, UA: NEGATIVE
Nitrite, UA: NEGATIVE
Protein, UA: POSITIVE — AB
Spec Grav, UA: >=1.03 — AB (ref 1.010–1.025)
Urobilinogen, UA: 0.2 E.U./dL
pH, UA: 5.5 (ref 5.0–8.0)

## 2019-06-25 MED ORDER — COLCHICINE 0.6 MG PO TABS: 0.6000 mg | ORAL_TABLET | Freq: Four times a day (QID) | ORAL | 3 refills | Status: DC | PRN

## 2019-06-25 NOTE — Patient Instructions (Addendum)
Colchicine tablets or capsules What is this medicine? COLCHICINE (KOL chi seen) is used to prevent or treat attacks of acute gout or gouty arthritis. This medicine is also used to treat familial Mediterranean fever. This medicine may be used for other purposes; ask your health care provider or pharmacist if you have questions. COMMON BRAND NAME(S): Colcrys, MITIGARE What should I tell my health care provider before I take this medicine? They need to know if you have any of these conditions:  kidney disease  liver disease  an unusual or allergic reaction to colchicine, other medicines, foods, dyes, or preservatives  pregnant or trying to get pregnant  breast-feeding How should I use this medicine? Take this medicine by mouth with a full glass of water. Follow the directions on the prescription label. You can take it with or without food. If it upsets your stomach, take it with food. Take your medicine at regular intervals. Do not take your medicine more often than directed. A special MedGuide will be given to you by the pharmacist with each prescription and refill. Be sure to read this information carefully each time. Talk to your pediatrician regarding the use of this medicine in children. While this drug may be prescribed for children as young as 18 years old for selected conditions, precautions do apply. Patients over 31 years old may have a stronger reaction and need a smaller dose. Overdosage: If you think you have taken too much of this medicine contact a poison control center or emergency room at once. NOTE: This medicine is only for you. Do not share this medicine with others. What if I miss a dose? If you miss a dose, take it as soon as you can. If it is almost time for your next dose, take only that dose. Do not take double or extra doses. What may interact with this medicine? Do not take this medicine with any of the following medications:  certain antivirals for HIV or  hepatitis This medicine may also interact with the following medications:  certain antibiotics like erythromycin or clarithromycin  certain medicines for blood pressure, heart disease, irregular heart beat  certain medicines for cholesterol like atorvastatin, lovastatin, and simvastatin  certain medicines for fungal infections like ketoconazole, itraconazole, or posaconazole  cyclosporine  grapefruit or grapefruit juice This list may not describe all possible interactions. Give your health care provider a list of all the medicines, herbs, non-prescription drugs, or dietary supplements you use. Also tell them if you smoke, drink alcohol, or use illegal drugs. Some items may interact with your medicine. What should I watch for while using this medicine? Visit your healthcare professional for regular checks on your progress. Tell your healthcare professional if your symptoms do not start to get better or if they get worse. You should make sure you get enough vitamin B12 while you are taking this medicine. Discuss the foods you eat and the vitamins you take with your healthcare professional. This medicine may increase your risk to bruise or bleed. Call you healthcare professional if you notice any unusual bleeding. What side effects may I notice from receiving this medicine? Side effects that you should report to your doctor or health care professional as soon as possible:  allergic reactions like skin rash, itching or hives, swelling of the face, lips, or tongue  low blood counts - this medicine may decrease the number of white blood cells, red blood cells, and platelets. You may be at increased risk for infections and bleeding  pain, tingling, numbness in the hands or feet  severe diarrhea  signs and symptoms of infection like fever; chills; cough; sore throat; pain or trouble passing urine  signs and symptoms of muscle injury like dark urine; trouble passing urine or change in the  amount of urine; unusually weak or tired; muscle pain; back pain  unusual bleeding or bruising  unusually weak or tired  vomiting Side effects that usually do not require medical attention (report these to your doctor or health care professional if they continue or are bothersome):  mild diarrhea  nausea  stomach pain This list may not describe all possible side effects. Call your doctor for medical advice about side effects. You may report side effects to FDA at 1-800-FDA-1088. Where should I keep my medicine? Keep out of the reach of children. Store at room temperature between 15 and 30 degrees C (59 and 86 degrees F). Keep container tightly closed. Protect from light. Throw away any unused medicine after the expiration date. NOTE: This sheet is a summary. It may not cover all possible information. If you have questions about this medicine, talk to your doctor, pharmacist, or health care provider.  2020 Elsevier/Gold Standard (2017-09-28 18:45:11) Uric Acid Test Why am I having this test? Uric acid is a chemical that gets released when certain substances in the body (purines) break down. Normally, uric acid is filtered out of the blood by the kidneys, and then it leaves the body through urine. If your body makes too much uric acid, or if your kidneys are not removing enough of it, uric acid can start to form crystals that build up in your joints or kidneys. Uric acid crystals in your joints can cause a type of arthritis (gout). Crystals in your urine can form kidney stones. You may have a uric acid test:  If you have joint pain or swelling that may be caused by gout.  If you frequently have kidney stones.  To help diagnose or monitor treatment of gout.  To monitor radiation or chemotherapy treatment.  To monitor kidney function or diagnose kidney disorders. What is being tested? This test measures the amount of uric acid in your blood or urine. What kind of sample is taken?      This test may be done with a blood sample or a urine sample.  A blood sample is usually collected by inserting a needle into a blood vessel. You may have a blood sample taken if: ? You are receiving treatment that increases purines in your body, such as radiation or chemotherapy. ? You have joint pain that may be caused by gout.  You may have a urine sample taken if you have kidney stones. You may be asked to collect urine samples at home over a period of 24 hours. How do I collect samples at home? When collecting a urine sample at home, make sure you:  Use supplies and instructions that you received from the lab.  Collect urine only in the germ-free (sterile) cup that you received from the lab.  Do not let any toilet paper or stool (feces) get into the cup.  Refrigerate the sample until you can return it to the lab.  Return the sample(s) to the lab as instructed. How do I prepare for this test? Follow instructions from your health care provider about:  Eating or drinking restrictions. You may need to stop eating and drinking everything except water starting 4 hours before the test.  Changing or stopping your  regular medicines. Some medicines can affect uric acid levels. Tell a health care provider about:  Recent intense exercise. If you have recently exercised a lot, this may affect your test results.  Any allergies you have.  All medicines you are taking, including vitamins, herbs, eye drops, creams, and over-the-counter medicines.  Any blood disorders you have.  Any surgeries you have had.  Any medical conditions you have.  Whether you are pregnant or may be pregnant. How are the results reported? Your results will be reported as a value that indicates how much uric acid is in your blood or urine. Your health care provider will compare your results to normal ranges that were established after testing a large group of people (reference ranges). Reference ranges may  vary among labs and hospitals. For this test, common reference ranges are:  Blood test: ? Adult male: 4.0-8.5 mg/dL or 0.24-0.51 mmol/L. ? Adult male: 2.7-7.3 mg/dL or 0.16-0.43 mmol/L. ? Child: 2.5-5.5 mg/dL or 0.12-0.32 mmol/L. ? Newborn: 2.0-6.2 mg/dL.  Urine test: 250-750 mg/24 hr or 1.48-4.43 mmol/day (SI units). What do the results mean? Results within your reference range are considered normal, meaning that you have a normal amount of uric acid in your body. Results that are higher than your reference range may mean that:  Your body is making too much uric acid.  Your kidneys are not removing enough uric acid.  Your gout treatment plan needs to be adjusted, if applicable. You may need more tests to determine what is causing high uric acid levels. Possible causes include:  Gout.  Kidney disease.  Cancer or cancer treatment.  A diet high in purines.  Alcohol abuse.  Diabetes (diabetes mellitus). Results that are below your reference range mean that you have too little uric acid in your body. This is usually caused by certain medicines and is usually not serious. Talk with your health care provider about what your results mean. Questions to ask your health care provider Ask your health care provider, or the department that is doing the test:  When will my results be ready?  How will I get my results?  What are my treatment options?  What other tests do I need?  What are my next steps? Summary  Uric acid is a chemical that gets released when certain substances in the body (purines) break down.  If your body makes too much uric acid, or if your kidneys are not removing enough of it, uric acid can start to form crystals that build up in your joints or kidneys.  Uric acid crystals in your joints can cause a type of arthritis (gout). Crystals in your urine can form kidney stones.  This test measures the amount of uric acid in your blood or urine. This  information is not intended to replace advice given to you by your health care provider. Make sure you discuss any questions you have with your health care provider. Document Released: 08/06/2004 Document Revised: 10/11/2017 Document Reviewed: 03/24/2017 Elsevier Patient Education  2020 [place]. Gout  Gout is painful swelling of your joints. Gout is a type of arthritis. It is caused by having too much uric acid in your body. Uric acid is a chemical that is made when your body breaks down substances called purines. If your body has too much uric acid, sharp crystals can form and build up in your joints. This causes pain and swelling. Gout attacks can happen quickly and be very painful (acute gout). Over time, the attacks  can affect more joints and happen more often (chronic gout). What are the causes?  Too much uric acid in your blood. This can happen because: ? Your kidneys do not remove enough uric acid from your blood. ? Your body makes too much uric acid. ? You eat too many foods that are high in purines. These foods include organ meats, some seafood, and beer.  Trauma or stress. What increases the risk?  Having a family history of gout.  Being male and middle-aged.  Being male and having gone through menopause.  Being very overweight (obese).  Drinking alcohol, especially beer.  Not having enough water in the body (being dehydrated).  Losing weight too quickly.  Having an organ transplant.  Having lead poisoning.  Taking certain medicines.  Having kidney disease.  Having a skin condition called psoriasis. What are the signs or symptoms? An attack of acute gout usually happens in just one joint. The most common place is the big toe. Attacks often start at night. Other joints that may be affected include joints of the feet, ankle, knee, fingers, wrist, or elbow. Symptoms of an attack may include:  Very bad pain.  Warmth.  Swelling.  Stiffness.  Shiny, red,  or purple skin.  Tenderness. The affected joint may be very painful to touch.  Chills and fever. Chronic gout may cause symptoms more often. More joints may be involved. You may also have white or yellow lumps (tophi) on your hands or feet or in other areas near your joints. How is this treated?  Treatment for this condition has two phases: treating an acute attack and preventing future attacks.  Acute gout treatment may include: ? NSAIDs. ? Steroids. These are taken by mouth or injected into a joint. ? Colchicine. This medicine relieves pain and swelling. It can be given by mouth or through an IV tube.  Preventive treatment may include: ? Taking small doses of NSAIDs or colchicine daily. ? Using a medicine that reduces uric acid levels in your blood. ? Making changes to your diet. You may need to see a food expert (dietitian) about what to eat and drink to prevent gout. Follow these instructions at home: During a gout attack   If told, put ice on the painful area: ? Put ice in a plastic bag. ? Place a towel between your skin and the bag. ? Leave the ice on for 20 minutes, 2-3 times a day.  Raise (elevate) the painful joint above the level of your heart as often as you can.  Rest the joint as much as possible. If the joint is in your leg, you may be given crutches.  Follow instructions from your doctor about what you cannot eat or drink. Avoiding future gout attacks  Eat a low-purine diet. Avoid foods and drinks such as: ? Liver. ? Kidney. ? Anchovies. ? Asparagus. ? Herring. ? Mushrooms. ? Mussels. ? Beer.  Stay at a healthy weight. If you want to lose weight, talk with your doctor. Do not lose weight too fast.  Start or continue an exercise plan as told by your doctor. Eating and drinking  Drink enough fluids to keep your pee (urine) pale yellow.  If you drink alcohol: ? Limit how much you use to:  0-1 drink a day for women.  0-2 drinks a day for men. ? Be  aware of how much alcohol is in your drink. In the [place] one drink equals one 12 oz bottle of beer (355 mL), one  5 oz glass of wine (148 mL), or one 1 oz glass of hard liquor (44 mL). General instructions  Take over-the-counter and prescription medicines only as told by your doctor.  Do not drive or use heavy machinery while taking prescription pain medicine.  Return to your normal activities as told by your doctor. Ask your doctor what activities are safe for you.  Keep all follow-up visits as told by your doctor. This is important. Contact a doctor if:  You have another gout attack.  You still have symptoms of a gout attack after 10 days of treatment.  You have problems (side effects) because of your medicines.  You have chills or a fever.  You have burning pain when you pee (urinate).  You have pain in your lower back or belly. Get help right away if:  You have very bad pain.  Your pain cannot be controlled.  You cannot pee. Summary  Gout is painful swelling of the joints.  The most common site of pain is the big toe, but it can affect other joints.  Medicines and avoiding some foods can help to prevent and treat gout attacks. This information is not intended to replace advice given to you by your health care provider. Make sure you discuss any questions you have with your health care provider. Document Released: 04/20/2008 Document Revised: 02/01/2018 Document Reviewed: 02/01/2018 Elsevier Patient Education  [place] A low-purine eating plan involves making food choices to limit your intake of purine. Purine is a kind of uric acid. Too much uric acid in your blood can cause certain conditions, such as gout and kidney stones. Eating a low-purine diet can help control these conditions. What are tips for following this plan? Reading food labels   Avoid foods with saturated or Trans fat.  Check the ingredient list of grains-based  foods, such as bread and cereal, to make sure that they contain whole grains.  Check the ingredient list of sauces or soups to make sure they do not contain meat or fish.  When choosing soft drinks, check the ingredient list to make sure they do not contain high-fructose corn syrup. Shopping  Buy plenty of fresh fruits and vegetables.  Avoid buying canned or fresh fish.  Buy dairy products labeled as low-fat or nonfat.  Avoid buying premade or processed foods. These foods are often high in fat, salt (sodium), and added sugar. Cooking  Use olive oil instead of butter when cooking. Oils like olive oil, canola oil, and sunflower oil contain healthy fats. Meal planning  Learn which foods do or do not affect you. If you find out that a food tends to cause your gout symptoms to flare up, avoid eating that food. You can enjoy foods that do not cause problems. If you have any questions about a food item, talk with your dietitian or health care provider.  Limit foods high in fat, especially saturated fat. Fat makes it harder for your body to get rid of uric acid.  Choose foods that are lower in fat and are lean sources of protein. General guidelines  Limit alcohol intake to no more than 1 drink a day for nonpregnant women and 2 drinks a day for men. One drink equals 12 oz of beer, 5 oz of wine, or 1 oz of hard liquor. Alcohol can affect the way your body gets rid of uric acid.  Drink plenty of water to keep your urine clear or pale yellow. Fluids  can help remove uric acid from your body.  If directed by your health care provider, take a vitamin C supplement.  Work with your health care provider and dietitian to develop a plan to achieve or maintain a healthy weight. Losing weight can help reduce uric acid in your blood. What foods are recommended? The items listed may not be a complete list. Talk with your dietitian about what dietary choices are best for you. Foods low in purines Foods  low in purines do not need to be limited. These include:  All fruits.  All low-purine vegetables, pickles, and olives.  Breads, pasta, rice, cornbread, and popcorn. Cake and other baked goods.  All dairy foods.  Eggs, nuts, and nut butters.  Spices and condiments, such as salt, herbs, and vinegar.  Plant oils, butter, and margarine.  Water, sugar-free soft drinks, tea, coffee, and cocoa.  Vegetable-based soups, broths, sauces, and gravies. Foods moderate in purines Foods moderate in purines should be limited to the amounts listed.   cup of asparagus, cauliflower, spinach, mushrooms, or green peas, each day.  2/3 cup uncooked oatmeal, each day.   cup dry wheat bran or wheat germ, each day.  2-3 ounces of meat or poultry, each day.  4-6 ounces of shellfish, such as crab, lobster, oysters, or shrimp, each day.  1 cup cooked beans, peas, or lentils, each day.  Soup, broths, or bouillon made from meat or fish. Limit these foods as much as possible. What foods are not recommended? The items listed may not be a complete list. Talk with your dietitian about what dietary choices are best for you. Limit your intake of foods high in purines, including:  Beer and other alcohol.  Meat-based gravy or sauce.  Canned or fresh fish, such as: ? Anchovies, sardines, herring, and tuna. ? Mussels and scallops. ? Codfish, trout, and haddock.  Berniece Salines.  Organ meats, such as: ? Liver or kidney. ? Tripe. ? Sweetbreads (thymus gland or pancreas).  [employer] bi[employer]ist.  Yeast or yeast extract supplements.  Drinks sweetened with high-fructose corn syrup. Summary  Eating a low-purine diet can help control conditions caused by too much uric acid in the body, such as gout or kidney stones.  Choose low-purine foods, limit alcohol, and limit foods high in fat.  You will learn over time which foods do or do not affect you. If you find out that a food tends to cause your gout symptoms  to flare up, avoid eating that food. This information is not intended to replace advice given to you by your health care provider. Make sure you discuss any questions you have with your health care provider. Document Released: 11/06/2010 Document Revised: 06/24/2017 Document Reviewed: 08/25/2016 Elsevier Patient Education  2020 [employer].

## 2019-06-25 NOTE — Progress Notes (Signed)
Patient [name]  Sick Visit  Subjective:  Patient ID: Clayton Pena, male    DOB: 1961-07-15  Age: 58 y.o. MRN: [id number]  CC:  Chief Complaint  Patient presents with  . Foot Pain    Right foot pain, redness onset 2 days ago    HPI Clayton Pena is a 58 year old male who presents for Sick Visit today.   Past Medical History:  Diagnosis Date  . Acute suppr otitis media w/o spon rupt ear drum, right ear 05/2019  . Angina at rest [facility])   . Coronary artery disease    Current Status: Since his last office visit, he has c/o right foot pain, swelling, and redness X 2 days. He denies fevers, chills, fatigue, recent infections, weight loss, and night sweats. He has not had any headaches, visual changes, dizziness, and falls. No chest pain, heart palpitations, cough and shortness of breath reported. No reports of GI problems such as nausea, vomiting, diarrhea, and constipation. He has no reports of blood in stools, dysuria and hematuria. No depression or anxiety reported today.   Past Surgical History:  Procedure Laterality Date  . BACK SURGERY    . CORONARY ANGIOPLASTY    . HERNIA REPAIR      Family History  Problem Relation Age of Onset  . CAD Mother   . Diabetes Mother   . CAD Father   . Prostate cancer Father   . Diabetes Brother   . Stroke Neg Hx     Social History   Socioeconomic History  . Marital status: Married    Spouse name: Not on file  . Number of children: Not on file  . Years of education: Not on file  . Highest education level: Not on file  Occupational History  . Not on file  Social Needs  . Financial resource strain: Not on file  . Food insecurity    Worry: Not on file    Inability: Not on file  . Transportation needs    Medical: Not on file    Non-medical: Not on file  Tobacco Use  . Smoking status: Never Smoker  . Smokeless tobacco: Never Used  Substance and Sexual Activity  . Alcohol use: No   Comment: occasional  . Drug use: No  . Sexual activity: Yes    Partners: Female  Lifestyle  . Physical activity    Days per week: Not on file    Minutes per session: Not on file  . Stress: Not on file  Relationships  . Social Herbalist on phone: Not on file    Gets together: Not on file    Attends religious service: Not on file    Active member of club or organization: Not on file    Attends meetings of clubs or organizations: Not on file    Relationship status: Not on file  . Intimate partner violence    Fear of current or ex partner: Not on file    Emotionally abused: Not on file    Physically abused: Not on file    Forced sexual activity: Not on file  Other Topics Concern  . Not on file  Social History Narrative  . Not on file    Outpatient Medications Prior to Visit  Medication Sig Dispense Refill  . acetaminophen-codeine (TYLENOL #3) 300-30 MG tablet Take 1 tablet by mouth every 8 (eight) hours as needed for moderate pain. 20 tablet 0  .  albuterol (VENTOLIN HFA) 108 (90 Base) MCG/ACT inhaler Inhale 2 puffs into the lungs every 6 (six) hours as needed for wheezing or shortness of breath. 18 g 3  . apixaban (ELIQUIS) 5 MG TABS tablet Take 1 tablet (5 mg total) by mouth 2 (two) times daily. 60 tablet 5  . bacitracin 500 UNIT/GM ointment Apply 1 application topically 2 (two) times daily. (Patient taking differently: Apply 1 application topically as needed for wound care. ) 15 g 0  . cetirizine (ZYRTEC) 10 MG tablet Take 1 tablet (10 mg total) by mouth at bedtime. 30 tablet 11  . ciprofloxacin-hydrocortisone (CIPRO HC) OTIC suspension Place 3 drops into the right ear 2 (two) times daily. X 10 days. (Patient not taking: Reported on 06/12/2019) 10 mL 0  . doxycycline (VIBRA-TABS) 100 MG tablet Take 1 tablet (100 mg total) by mouth 2 (two) times daily. (Patient not taking: Reported on 06/12/2019) 20 tablet 0  . Elastic Bandages & Supports (MEDICAL COMPRESSION STOCKINGS)  MISC 1 each by Does not apply route daily. 1 each 2  . ferrous sulfate 325 (65 FE) MG tablet Take 1 tablet (325 mg total) by mouth daily with breakfast. 30 tablet 3  . hydrochlorothiazide (HYDRODIURIL) 25 MG tablet Take 1 tablet (25 mg total) by mouth daily. 30 tablet 5  . ibuprofen (ADVIL) 200 MG tablet Take 600 mg by mouth every 6 (six) hours as needed for moderate pain.    . sodium chloride (OCEAN) 0.65 % SOLN nasal spray Place 1 spray into both nostrils as needed for congestion.    Marland Kitchen zinc gluconate 50 MG tablet Take 1 tablet (50 mg total) by mouth daily. 30 tablet 3   No facility-administered medications prior to visit.     Allergies  Allergen Reactions  . Penicillins Anaphylaxis    Has patient had a PCN reaction causing immediate rash, facial/tongue/throat swelling, SOB or lightheadedness with hypotension: unknown Has patient had a PCN reaction causing severe rash involving mucus membranes or skin necrosis: unknown Has patient had a PCN reaction that required hospitalization: unknown Has patient had a PCN reaction occurring within the last 10 years: no If all of the above answers are "NO", then may proceed with Cephalosporin use.   . Demerol  [Meperidine Hcl] Other (See Comments)    REACTION:  Increases blood pressure  . Demerol [Meperidine] Other (See Comments)    REACTION:  Increases blood pressure    ROS Review of Systems  Constitutional: Negative.   HENT: Negative.   Eyes: Negative.   Respiratory: Negative.   Cardiovascular: Negative.   Gastrointestinal: Positive for abdominal distention.  Endocrine: Negative.   Genitourinary: Negative.   Musculoskeletal: Positive for joint swelling (right toe).  Skin: Negative.   Allergic/Immunologic: Negative.   Neurological: Negative.   Hematological: Negative.   Psychiatric/Behavioral: Negative.       Objective:    Physical Exam  Constitutional: He is oriented to person, place, and time. He appears well-developed and  well-nourished.  HENT:  Head: Normocephalic and atraumatic.  Eyes: Conjunctivae are normal.  Neck: Normal range of motion. Neck supple.  Cardiovascular: Normal rate, regular rhythm, normal heart sounds and intact distal pulses.  Pulmonary/Chest: Effort normal and breath sounds normal.  Abdominal: Soft. Bowel sounds are normal. He exhibits distension (obese).  Musculoskeletal:        General: Edema present.  Neurological: He is alert and oriented to person, place, and time. He has normal reflexes.  Skin: Skin is warm and dry.  Psychiatric: He  has a normal mood and affect. His behavior is normal. Judgment and thought content normal.  Nursing note reviewed.   BP (!) 142/76   Pulse 80   Temp 98 F (36.7 C) (Oral)   Ht 6' 1.2" (1.859 m)   Wt (!) 339 lb 6.4 oz (154 kg)   SpO2 97%   BMI 44.53 kg/m  Wt Readings from Last 3 Encounters:  06/25/19 (!) 339 lb 6.4 oz (154 kg)  06/12/19 (!) 341 lb (154.7 kg)  05/28/19 (!) 340 lb 3.2 oz (154.3 kg)     Health Maintenance Due  Topic Date Due  . COLONOSCOPY  03/04/2009    There are no preventive care reminders to display for this patient.  Lab Results  Component Value Date   TSH 2.550 12/20/2018   Lab Results  Component Value Date   WBC 13.7 (H) 04/28/2019   HGB 16.3 04/28/2019   HCT 50.2 04/28/2019   MCV 88.8 04/28/2019   PLT 247 04/28/2019   Lab Results  Component Value Date   NA 134 (L) 04/28/2019   K 3.5 04/28/2019   CO2 23 04/28/2019   GLUCOSE 183 (H) 04/28/2019   BUN 13 04/28/2019   CREATININE 1.06 04/28/2019   BILITOT 0.6 12/20/2018   ALKPHOS 54 12/20/2018   AST 30 12/20/2018   ALT 50 (H) 12/20/2018   PROT 7.1 12/20/2018   ALBUMIN 4.4 12/20/2018   CALCIUM 8.7 (L) 04/28/2019   ANIONGAP 9 04/28/2019   Lab Results  Component Value Date   CHOL 122 12/20/2018   Lab Results  Component Value Date   HDL 38 (L) 12/20/2018   Lab Results  Component Value Date   LDLCALC 62 12/20/2018   Lab Results  Component  Value Date   TRIG 110 12/20/2018   Lab Results  Component Value Date   CHOLHDL 3.2 12/20/2018   Lab Results  Component Value Date   HGBA1C 5.8 (A) 05/28/2019      Assessment & Plan:   1. Acute gout of right foot, unspecified cause We will initiate Colchicine today.  - Uric Acid - colchicine 0.6 MG tablet; Take 1 tablet (0.6 mg total) by mouth every 6 (six) hours as needed. As needed for Gouty Episodes.  Dispense: 30 tablet; Refill: 3  2. History of elevated glucose - Urinalysis Dipstick  3. Class 3 severe obesity due to excess calories with serious comorbidity and body mass index (BMI) of 40.0 to 44.9 in adult [facility]) Body mass index is 44.53 kg/m. Goal BMI  is <30. Encouraged efforts to reduce weight include engaging in physical activity as tolerated with goal of 150 minutes per week. Improve dietary choices and eat a meal regimen consistent with a Mediterranean or DASH diet. Reduce simple carbohydrates. Do not skip meals and eat healthy snacks throughout the day to avoid over-eating at dinner. Set a goal weight loss that is achievable for you.  4. Follow up He will follow up in 6 months.    Meds ordered this encounter  Medications  . colchicine 0.6 MG tablet    Sig: Take 1 tablet (0.6 mg total) by mouth every 6 (six) hours as needed. As needed for Gouty Episodes.    Dispense:  30 tablet    Refill:  3    Orders Placed This Encounter  Procedures  . Uric Acid  . Urinalysis Dipstick    Referral Orders  No referral(s) requested today    Kathe Becton,  MSN, FNP-BC [facility] [address] [place], [place] [address] [phone] fax  Problem List Items Addressed This Visit    None    Visit Diagnoses    Acute gout of right foot, unspecified cause    -  Primary   Relevant Medications   colchicine 0.6 MG tablet   Other Relevant Orders   Uric Acid   History of elevated glucose        Relevant Orders   Urinalysis Dipstick (Completed)   Class 3 severe obesity due to excess calories with serious comorbidity and body mass index (BMI) of 40.0 to 44.9 in adult [facility])       Follow up          Meds ordered this encounter  Medications  . colchicine 0.6 MG tablet    Sig: Take 1 tablet (0.6 mg total) by mouth every 6 (six) hours as needed. As needed for Gouty Episodes.    Dispense:  30 tablet    Refill:  3    Follow-up: Return in about 6 months (around 12/23/2019).    Azzie Glatter, FNP

## 2019-06-26 LAB — URIC ACID: Uric Acid: 6.3 mg/dL (ref 3.7–8.6)

## 2019-07-17 ENCOUNTER — Ambulatory Visit: Payer: Self-pay | Admitting: Family Medicine

## 2019-08-15 ENCOUNTER — Other Ambulatory Visit: Payer: Self-pay

## 2019-08-15 ENCOUNTER — Encounter: Payer: Self-pay | Admitting: Nurse Practitioner

## 2019-08-15 ENCOUNTER — Ambulatory Visit (INDEPENDENT_AMBULATORY_CARE_PROVIDER_SITE_OTHER): Payer: Medicaid Other | Admitting: Nurse Practitioner

## 2019-08-15 VITALS — BP 151/90 | HR 92 | Temp 97.6°F | Resp 16 | Ht 73.0 in | Wt 335.0 lb

## 2019-08-15 DIAGNOSIS — Z7409 Other reduced mobility: Secondary | ICD-10-CM | POA: Insufficient documentation

## 2019-08-15 DIAGNOSIS — R238 Other skin changes: Secondary | ICD-10-CM | POA: Diagnosis not present

## 2019-08-15 DIAGNOSIS — Z7901 Long term (current) use of anticoagulants: Secondary | ICD-10-CM | POA: Insufficient documentation

## 2019-08-15 DIAGNOSIS — I10 Essential (primary) hypertension: Secondary | ICD-10-CM | POA: Diagnosis not present

## 2019-08-15 MED ORDER — HYDROCHLOROTHIAZIDE 25 MG PO TABS: 25.0000 mg | ORAL_TABLET | Freq: Every day | ORAL | 5 refills | Status: DC

## 2019-08-15 MED ORDER — DOXYCYCLINE HYCLATE 100 MG PO TABS: 100.0000 mg | ORAL_TABLET | Freq: Two times a day (BID) | ORAL | 0 refills | Status: DC

## 2019-08-15 MED ORDER — ACETAMINOPHEN-CODEINE #3 300-30 MG PO TABS: 1.0000 | ORAL_TABLET | Freq: Three times a day (TID) | ORAL | 0 refills | Status: DC | PRN

## 2019-08-15 NOTE — Progress Notes (Signed)
Acute Office Visit  Subjective:    Patient ID: Clayton Pena, male    DOB: 10-02-1960, 59 y.o.   MRN: [id number]  Chief Complaint  Patient presents with  . Sore    sores on back that are red and hurting   . Medication Refill    asking for a refill on tylenol #3     HPI Patient is in today for evaluation of recurrent "sores".  He has a history significant for hypertension, coronary artery disease and DVT.  He is on anticoagulant therapy.  He admits that around Thanksgiving he developed the sores on his lower back and upper buttocks area.  He admits that become red and "angry".  They cause a lot of itching and then produces some drainage.  He denies any changes in his diet, detergents uses Free and clear or soaps.  He admits that he did change his undergarments thinking this may help.  He has a history of recurrent skin infections lastly on his left hand middle finger.  He has used bacitracin ointment which was effective however did not completely resolve.    Past Medical History:  Diagnosis Date  . Acute suppr otitis media w/o spon rupt ear drum, right ear 05/2019  . Angina at rest [facility])   . Coronary artery disease     Past Surgical History:  Procedure Laterality Date  . BACK SURGERY    . CORONARY ANGIOPLASTY    . HERNIA REPAIR      Family History  Problem Relation Age of Onset  . CAD Mother   . Diabetes Mother   . CAD Father   . Prostate cancer Father   . Diabetes Brother   . Stroke Neg Hx     Social History   Socioeconomic History  . Marital status: Married    Spouse name: Not on file  . Number of children: Not on file  . Years of education: Not on file  . Highest education level: Not on file  Occupational History  . Not on file  Tobacco Use  . Smoking status: Never Smoker  . Smokeless tobacco: Never Used  Substance and Sexual Activity  . Alcohol use: No    Comment: occasional  . Drug use: No  . Sexual activity: Yes    Partners: Female  Other Topics  Concern  . Not on file  Social History Narrative  . Not on file   Social Determinants of Health   Financial Resource Strain:   . Difficulty of Paying Living Expenses: Not on file  Food Insecurity:   . Worried About Charity fundraiser in the Last Year: Not on file  . Ran Out of Food in the Last Year: Not on file  Transportation Needs:   . Lack of Transportation (Medical): Not on file  . Lack of Transportation (Non-Medical): Not on file  Physical Activity:   . Days of Exercise per Week: Not on file  . Minutes of Exercise per Session: Not on file  Stress:   . Feeling of Stress : Not on file  Social Connections:   . Frequency of Communication with Friends and Family: Not on file  . Frequency of Social Gatherings with Friends and Family: Not on file  . Attends Religious Services: Not on file  . Active Member of Clubs or Organizations: Not on file  . Attends Archivist Meetings: Not on file  . Marital Status: Not on file  Intimate Partner Violence:   . Fear  of Current or Ex-Partner: Not on file  . Emotionally Abused: Not on file  . Physically Abused: Not on file  . Sexually Abused: Not on file    Outpatient Medications Prior to Visit  Medication Sig Dispense Refill  . albuterol (VENTOLIN HFA) 108 (90 Base) MCG/ACT inhaler Inhale 2 puffs into the lungs every 6 (six) hours as needed for wheezing or shortness of breath. 18 g 3  . apixaban (ELIQUIS) 5 MG TABS tablet Take 1 tablet (5 mg total) by mouth 2 (two) times daily. 60 tablet 5  . bacitracin 500 UNIT/GM ointment Apply 1 application topically 2 (two) times daily. (Patient taking differently: Apply 1 application topically as needed for wound care. ) 15 g 0  . cetirizine (ZYRTEC) 10 MG tablet Take 1 tablet (10 mg total) by mouth at bedtime. 30 tablet 11  . colchicine 0.6 MG tablet Take 1 tablet (0.6 mg total) by mouth every 6 (six) hours as needed. As needed for Gouty Episodes. 30 tablet 3  . Elastic Bandages & Supports  (MEDICAL COMPRESSION STOCKINGS) MISC 1 each by Does not apply route daily. 1 each 2  . ferrous sulfate 325 (65 FE) MG tablet Take 1 tablet (325 mg total) by mouth daily with breakfast. 30 tablet 3  . ibuprofen (ADVIL) 200 MG tablet Take 600 mg by mouth every 6 (six) hours as needed for moderate pain.    . sodium chloride (OCEAN) 0.65 % SOLN nasal spray Place 1 spray into both nostrils as needed for congestion.    Marland Kitchen zinc gluconate 50 MG tablet Take 1 tablet (50 mg total) by mouth daily. 30 tablet 3  . acetaminophen-codeine (TYLENOL #3) 300-30 MG tablet Take 1 tablet by mouth every 8 (eight) hours as needed for moderate pain. 20 tablet 0  . ciprofloxacin-hydrocortisone (CIPRO HC) OTIC suspension Place 3 drops into the right ear 2 (two) times daily. X 10 days. 10 mL 0  . doxycycline (VIBRA-TABS) 100 MG tablet Take 1 tablet (100 mg total) by mouth 2 (two) times daily. 20 tablet 0  . hydrochlorothiazide (HYDRODIURIL) 25 MG tablet Take 1 tablet (25 mg total) by mouth daily. 30 tablet 5   No facility-administered medications prior to visit.    Allergies  Allergen Reactions  . Penicillins Anaphylaxis    Has patient had a PCN reaction causing immediate rash, facial/tongue/throat swelling, SOB or lightheadedness with hypotension: unknown Has patient had a PCN reaction causing severe rash involving mucus membranes or skin necrosis: unknown Has patient had a PCN reaction that required hospitalization: unknown Has patient had a PCN reaction occurring within the last 10 years: no If all of the above answers are "NO", then may proceed with Cephalosporin use.   . Demerol  [Meperidine Hcl] Other (See Comments)    REACTION:  Increases blood pressure  . Demerol [Meperidine] Other (See Comments)    REACTION:  Increases blood pressure    Review of Systems  Constitutional: Negative.   HENT: Negative.   Eyes: Negative.   Respiratory: Negative.   Cardiovascular: Negative.   Gastrointestinal: Negative.     Endocrine: Negative.   Genitourinary: Negative.   Musculoskeletal: Negative.   Skin: Positive for rash.  Allergic/Immunologic: Negative.   Neurological: Negative.   Hematological: Negative.   Psychiatric/Behavioral: Negative.        Objective:    Physical Exam Constitutional:      Appearance: Normal appearance. He is obese.  HENT:     Head: Normocephalic.     Mouth/Throat:  Mouth: Mucous membranes are dry.     Pharynx: Oropharynx is clear.  Pulmonary:     Effort: Pulmonary effort is normal.  Musculoskeletal:        General: Normal range of motion.  Skin:    General: Skin is warm and dry.     Findings: Rash present.          Comments: Across the midline of his lower back and the top of sacrum positive erythema no active drainage somewhat scab over  Left hand third digit scabbed over wound no drainage  Neurological:     General: No focal deficit present.     Mental Status: He is alert and oriented to person, place, and time.  Psychiatric:        Mood and Affect: Mood normal.        Behavior: Behavior normal.        Thought Content: Thought content normal.        Judgment: Judgment normal.     BP (!) 151/90 (BP Location: Left Arm, Patient Position: Sitting, Cuff Size: Large)   Pulse 92   Temp 97.6 F (36.4 C) (Oral)   Resp 16   Ht 6\' 1"  (1.854 m)   Wt (!) 335 lb (152 kg)   SpO2 100%   BMI 44.20 kg/m  Wt Readings from Last 3 Encounters:  08/15/19 (!) 335 lb (152 kg)  06/25/19 (!) 339 lb 6.4 oz (154 kg)  06/12/19 (!) 341 lb (154.7 kg)    Health Maintenance Due  Topic Date Due  . COLONOSCOPY  03/04/2009    There are no preventive care reminders to display for this patient.   Lab Results  Component Value Date   TSH 2.550 12/20/2018   Lab Results  Component Value Date   WBC 13.7 (H) 04/28/2019   HGB 16.3 04/28/2019   HCT 50.2 04/28/2019   MCV 88.8 04/28/2019   PLT 247 04/28/2019   Lab Results  Component Value Date   NA 134 (L) 04/28/2019    K 3.5 04/28/2019   CO2 23 04/28/2019   GLUCOSE 183 (H) 04/28/2019   BUN 13 04/28/2019   CREATININE 1.06 04/28/2019   BILITOT 0.6 12/20/2018   ALKPHOS 54 12/20/2018   AST 30 12/20/2018   ALT 50 (H) 12/20/2018   PROT 7.1 12/20/2018   ALBUMIN 4.4 12/20/2018   CALCIUM 8.7 (L) 04/28/2019   ANIONGAP 9 04/28/2019   Lab Results  Component Value Date   CHOL 122 12/20/2018   Lab Results  Component Value Date   HDL 38 (L) 12/20/2018   Lab Results  Component Value Date   LDLCALC 62 12/20/2018   Lab Results  Component Value Date   TRIG 110 12/20/2018   Lab Results  Component Value Date   CHOLHDL 3.2 12/20/2018   Lab Results  Component Value Date   HGBA1C 5.8 (A) 05/28/2019       Assessment & Plan:   Problem List Items Addressed This Visit      High   Essential hypertension   Relevant Medications   hydrochlorothiazide (HYDRODIURIL) 25 MG tablet    Other Visit Diagnoses    Erythematous papules of skin    -  Primary   Relevant Medications   doxycycline (VIBRA-TABS) 100 MG tablet   acetaminophen-codeine (TYLENOL #3) 300-30 MG tablet       Meds ordered this encounter  Medications  . doxycycline (VIBRA-TABS) 100 MG tablet    Sig: Take 1 tablet (100 mg total)  by mouth 2 (two) times daily for 10 days.    Dispense:  20 tablet    Refill:  0    Order Specific Question:   Supervising Provider    Answer:   Tresa Garter [id number]  . acetaminophen-codeine (TYLENOL #3) 300-30 MG tablet    Sig: Take 1 tablet by mouth every 8 (eight) hours as needed for moderate pain.    Dispense:  20 tablet    Refill:  0    Order Specific Question:   Supervising Provider    Answer:   Tresa Garter [id number]  . hydrochlorothiazide (HYDRODIURIL) 25 MG tablet    Sig: Take 1 tablet (25 mg total) by mouth daily.    Dispense:  30 tablet    Refill:  5    Order Specific Question:   Supervising Provider    Answer:   Tresa Garter [id number]     Vevelyn Francois, NP

## 2019-08-15 NOTE — Patient Instructions (Addendum)
MRSA Infection, Diagnosis, Adult Methicillin-resistant Staphylococcus aureus (MRSA) infection is caused by bacteria called Staphylococcus aureus, or staph, that no longer respond to common antibiotic medicines (drug-resistant bacteria). MRSA infection can be hard to treat. Most of the time, MRSA can be on the skin or in the nose without causing problems (colonized). However, if MRSA enters the body through a cut, a sore, or an invasive medical device, it can cause a serious infection. What are the causes? This condition is caused by staph bacteria. Illness may develop after exposure to the bacteria through:  Skin-to-skin contact with someone who is infected with MRSA.  Touching surfaces that have the bacteria on them.  Having a procedure or using equipment that allows MRSA to enter the body.  Having MRSA that lives on your skin and then enters your body through: ? A cut or scratch. ? A surgery or procedure. ? The use of a medical device. Contact with the bacteria may occur:  During a stay in a hospital, rehabilitation facility, nursing home, or other health care facility (health care-associated MRSA).  In daily activities where there is close contact with others, such as sports, child care centers, or at home (community-associated MRSA). What increases the risk? You are more likely to develop this condition if you:  Have a surgery or procedure.  Have an IV or a thin tube (catheter) placed in your body.  Are elderly.  Are on kidney dialysis.  Have recently taken an antibiotic medicine.  Live in a long-term care facility.  Have a chronic wound or skin ulcer.  Have a weak body defense system (immune system).  Play sports that involve skin-to-skin contact.  Live in a crowded place, like a dormitory or [employer]  Share towels, razors, or sports equipment with other people.  Have a history of MRSA infection or colonization. What are the signs or symptoms? Symptoms  of this condition depend on the area that is affected. Symptoms may include:  A pus-filled pimple or boil.  Pus that drains from your skin.  A sore (abscess) under your skin or somewhere in your body.  Fever with or without chills.  Difficulty breathing.  Coughing up blood.  Redness, warmth, swelling, or pain in the affected area. How is this diagnosed? This condition may be diagnosed based on:  A physical exam.  Your medical history.  Taking a sample from the infected area and growing it in a lab (culture). You may also have other tests, including:  Imaging tests, such as X-rays, a CT scan, or an MRI.  Lab tests, such as blood, urine, or phlegm (sputum) tests. You skin or nose may be swabbed when you are admitted to a health care facility for a procedure. This is to screen for MRSA. How is this treated? Treatment depends on the type of MRSA infection you have and how severe, deep, or extensive it is. Treatment may include:  Antibiotic medicines.  Surgery to drain pus from the infected area. Severe infections may require a hospital stay. Follow these instructions at home: Medicines  Take over-the-counter and prescription medicines only as told by your health care provider.  If you were prescribed an antibiotic medicine, use it as told by your health care provider. Do not stop using the antibiotic even if you start to feel better. Prevention Follow these instructions to avoid spreading the infection to others:  Wash your hands frequently with soap and water. If soap and water are not available, use an alcohol-based hand sanitizer.  Avoid close contact with those around you as much as possible. Do not use towels, razors, toothbrushes, bedding, or other items that will be used by others.  Wash towels, bedding, and clothes in the washing machine with detergent and hot water. Dry them in a hot dryer.  Clean surfaces regularly to remove germs (disinfection). Use products  or solutions that contain bleach. Make sure you disinfect bathroom surfaces, food preparation areas, exercise equipment, and doorknobs.  General instructions  If you have a wound, follow instructions from your health care provider about how to take care of your wound. ? Do not pick at scabs. ? Do not try to drain any infection sites or pimples.  Tell all your health care providers that you have MRSA, or if you have ever had a MRSA infection.  Keep all follow-up visits as told by your health care provider. This is important. Contact a health care provider if you:  Do not get better.  Have symptoms that get worse.  Have new symptoms. Get help right away if you have:  Nausea or vomiting, or if you cannot take medicine without vomiting.  Trouble breathing.  Chest pain. These symptoms may represent a serious problem that is an emergency. Do not wait to see if the symptoms will go away. Get medical help right away. Call your local emergency services (911 in the [place] Do not drive yourself to the hospital. Summary  MRSA infection is caused by bacteria called Staphylococcus aureus, or staph, that no longer respond to common antibiotic medicines.  Treatment for this condition depends on the type of MRSA infection you have and how severe, deep, and extensive it is.  If you were prescribed an antibiotic medicine, use it as told by your health care provider. Do not stop using the antibiotic even if you start to feel better.  Follow instructions from your health care provider to avoid spreading the infection to others. This information is not intended to replace advice given to you by your health care provider. Make sure you discuss any questions you have with your health care provider. Document Revised: 09/28/2018 Document Reviewed: 09/29/2018 Elsevier Patient Education  2020 [employer]  

## 2019-09-07 ENCOUNTER — Telehealth: Payer: Self-pay | Admitting: Nurse Practitioner

## 2019-09-07 NOTE — Telephone Encounter (Signed)
Called and spoke with patient, he states no other symptoms, no drainage. He states that new one has just started out and looks "angry red" and he wanted to ask for an antibiotic before it got worse.

## 2019-09-07 NOTE — Telephone Encounter (Signed)
Patient left a message on the voicemail. Saying that sores on back had mostly cleared up until 2 days ago a new one has surfaced. He was asking if he could have a refill on doxycycline? Please advise.

## 2019-09-09 ENCOUNTER — Other Ambulatory Visit: Payer: Self-pay | Admitting: Nurse Practitioner

## 2019-09-09 DIAGNOSIS — R238 Other skin changes: Secondary | ICD-10-CM

## 2019-09-10 ENCOUNTER — Other Ambulatory Visit: Payer: Self-pay | Admitting: Nurse Practitioner

## 2019-09-10 DIAGNOSIS — R238 Other skin changes: Secondary | ICD-10-CM

## 2019-09-10 DIAGNOSIS — L089 Local infection of the skin and subcutaneous tissue, unspecified: Secondary | ICD-10-CM

## 2019-09-10 MED ORDER — BACITRACIN 500 UNIT/GM EX OINT: 1.0000 "application " | TOPICAL_OINTMENT | Freq: Four times a day (QID) | CUTANEOUS | 2 refills | Status: AC

## 2019-09-10 MED ORDER — DOXYCYCLINE HYCLATE 100 MG PO TABS: 100.0000 mg | ORAL_TABLET | Freq: Two times a day (BID) | ORAL | 0 refills | Status: AC

## 2019-09-10 NOTE — Telephone Encounter (Signed)
Pt requesting refill on doxycycline. Please advise.

## 2019-09-10 NOTE — Telephone Encounter (Signed)
Called and spoke with pt. Advised that this will be sent in today but after this he will have to come in to be evaluated due to prevent overuse of antibiotics and resistance over time. Patient verbalized understanding. Thanks!

## 2019-10-25 ENCOUNTER — Telehealth: Payer: Self-pay | Admitting: Family Medicine

## 2019-10-25 NOTE — Telephone Encounter (Signed)
Called and spoke with patient. He says when he take the eliquis he is having nausea and joint pain, He has read that these can be a side effect from eliquis. He is also asking for a rx for Cialis if possible. I advised him I would send the message and we would contact him next week. Please advise.

## 2019-10-25 NOTE — Telephone Encounter (Signed)
Pt wants know if he can get an eloquis subsitute. Please call pt back.

## 2019-11-06 ENCOUNTER — Other Ambulatory Visit: Payer: Self-pay

## 2019-11-06 ENCOUNTER — Ambulatory Visit (INDEPENDENT_AMBULATORY_CARE_PROVIDER_SITE_OTHER): Payer: Medicaid Other | Admitting: Family Medicine

## 2019-11-06 VITALS — BP 140/88 | HR 74 | Temp 98.9°F | Resp 16 | Ht 73.0 in | Wt 333.0 lb

## 2019-11-06 DIAGNOSIS — R5383 Other fatigue: Secondary | ICD-10-CM | POA: Diagnosis not present

## 2019-11-06 DIAGNOSIS — R600 Localized edema: Secondary | ICD-10-CM | POA: Diagnosis not present

## 2019-11-06 DIAGNOSIS — Z86718 Personal history of other venous thrombosis and embolism: Secondary | ICD-10-CM

## 2019-11-06 DIAGNOSIS — Z6841 Body Mass Index (BMI) 40.0 and over, adult: Secondary | ICD-10-CM

## 2019-11-06 DIAGNOSIS — M25562 Pain in left knee: Secondary | ICD-10-CM | POA: Diagnosis not present

## 2019-11-06 DIAGNOSIS — M25561 Pain in right knee: Secondary | ICD-10-CM | POA: Diagnosis not present

## 2019-11-06 DIAGNOSIS — G4719 Other hypersomnia: Secondary | ICD-10-CM | POA: Diagnosis not present

## 2019-11-06 DIAGNOSIS — M2550 Pain in unspecified joint: Secondary | ICD-10-CM | POA: Diagnosis not present

## 2019-11-06 DIAGNOSIS — G8929 Other chronic pain: Secondary | ICD-10-CM | POA: Diagnosis not present

## 2019-11-06 MED ORDER — FUROSEMIDE 20 MG PO TABS: 20.0000 mg | ORAL_TABLET | Freq: Every day | ORAL | 3 refills | Status: DC

## 2019-11-06 NOTE — Progress Notes (Signed)
Patient [name]  Established Patient Office Visit  Subjective:  Patient ID: Clayton Pena, male    DOB: 1960-11-13  Age: 59 y.o. MRN: [id number]  CC:  Chief Complaint  Patient presents with  . Medication Management    discuss changing eliquis   . Knee Pain  . Medication Management    asking for cialis     HPI Clayton Pena is a very pleasant 59 year old male with a medical history significant for coronary artery disease, essential hypertension, acute venous embolism and thrombosis of deep vessels on Eliquis, history of gout, and morbid obesity presents complaining of bilateral knee pain, increased nausea and vomiting, abdominal discomfort, and inability to lose weight.  Abdominal discomfort, nausea, vomiting Patient states that he has been experiencing increased nausea, some vomiting, and abdominal discomfort.  Patient suspected that the symptoms were related to Eliquis or hydrochlorothiazide.  Patient has been on these medications for a number of years without any complications.  He says that he stopped taking medications greater than a week ago and symptoms have improved some.  He endorses abdominal discomfort primarily to right upper and lower quadrants.  Patient does not have nausea or vomiting at present, but has not resumed home medications.  Bilateral knee pain Patient is complaining of bilateral knee pain, primarily to right side.  Knee pain is been present for greater than 1 year.  He says that pain has been worsening with prolonged standing, walking long distances, and weightbearing.  Patient was previously under the care of orthopedic services, but has been lost to follow-up due to insurance constraints.  Pain intensity is 5/10 characterized as intermittent and aching. Patient does have a history of gout.  He denies any recent injury or falls.  Erectile dysfunction: Patient is inquiring about Cialis is for occasional erectile  dysfunction. Appears to have done a great deal of research on this medication, side effects, and other medications that are similar.  There is no clear understanding of whether this patient is inquiring about starting a medication for erectile dysfunction, or he is seeking information.  He is not having difficulty starting urine stream, dysuria, urinary retention, frequency, urgency, or incontinence of urine or stool.  Past Medical History:  Diagnosis Date  . Acute suppr otitis media w/o spon rupt ear drum, right ear 05/2019  . Angina at rest [facility])   . Coronary artery disease     Past Surgical History:  Procedure Laterality Date  . BACK SURGERY    . CORONARY ANGIOPLASTY    . HERNIA REPAIR      Family History  Problem Relation Age of Onset  . CAD Mother   . Diabetes Mother   . CAD Father   . Prostate cancer Father   . Diabetes Brother   . Stroke Neg Hx     Social History   Socioeconomic History  . Marital status: Married    Spouse name: Not on file  . Number of children: Not on file  . Years of education: Not on file  . Highest education level: Not on file  Occupational History  . Not on file  Tobacco Use  . Smoking status: Never Smoker  . Smokeless tobacco: Never Used  Substance and Sexual Activity  . Alcohol use: No    Comment: occasional  . Drug use: No  . Sexual activity: Yes    Partners: Female  Other Topics Concern  . Not on file  Social History Narrative  .  Not on file   Social Determinants of Health   Financial Resource Strain:   . Difficulty of Paying Living Expenses:   Food Insecurity:   . Worried About Charity fundraiser in the Last Year:   . Arboriculturist in the Last Year:   Transportation Needs:   . Film/video editor (Medical):   Marland Kitchen Lack of Transportation (Non-Medical):   Physical Activity:   . Days of Exercise per Week:   . Minutes of Exercise per Session:   Stress:   . Feeling of Stress :   Social Connections:   . Frequency of  Communication with Friends and Family:   . Frequency of Social Gatherings with Friends and Family:   . Attends Religious Services:   . Active Member of Clubs or Organizations:   . Attends Archivist Meetings:   Marland Kitchen Marital Status:   Intimate Partner Violence:   . Fear of Current or Ex-Partner:   . Emotionally Abused:   Marland Kitchen Physically Abused:   . Sexually Abused:     Outpatient Medications Prior to Visit  Medication Sig Dispense Refill  . acetaminophen-codeine (TYLENOL #3) 300-30 MG tablet Take 1 tablet by mouth every 8 (eight) hours as needed for moderate pain. 20 tablet 0  . albuterol (VENTOLIN HFA) 108 (90 Base) MCG/ACT inhaler Inhale 2 puffs into the lungs every 6 (six) hours as needed for wheezing or shortness of breath. 18 g 3  . apixaban (ELIQUIS) 5 MG TABS tablet Take 1 tablet (5 mg total) by mouth 2 (two) times daily. 60 tablet 5  . bacitracin 500 UNIT/GM ointment Apply 1 application topically 4 (four) times daily. 108 g 2  . cetirizine (ZYRTEC) 10 MG tablet Take 1 tablet (10 mg total) by mouth at bedtime. 30 tablet 11  . Elastic Bandages & Supports (MEDICAL COMPRESSION STOCKINGS) MISC 1 each by Does not apply route daily. 1 each 2  . hydrochlorothiazide (HYDRODIURIL) 25 MG tablet Take 1 tablet (25 mg total) by mouth daily. 30 tablet 5  . ibuprofen (ADVIL) 200 MG tablet Take 600 mg by mouth every 6 (six) hours as needed for moderate pain.    . sodium chloride (OCEAN) 0.65 % SOLN nasal spray Place 1 spray into both nostrils as needed for congestion.    Marland Kitchen zinc gluconate 50 MG tablet Take 1 tablet (50 mg total) by mouth daily. 30 tablet 3  . colchicine 0.6 MG tablet Take 1 tablet (0.6 mg total) by mouth every 6 (six) hours as needed. As needed for Gouty Episodes. (Patient not taking: Reported on 11/06/2019) 30 tablet 3  . ferrous sulfate 325 (65 FE) MG tablet Take 1 tablet (325 mg total) by mouth daily with breakfast. (Patient not taking: Reported on 11/06/2019) 30 tablet 3   No  facility-administered medications prior to visit.    Allergies  Allergen Reactions  . Penicillins Anaphylaxis    Has patient had a PCN reaction causing immediate rash, facial/tongue/throat swelling, SOB or lightheadedness with hypotension: unknown Has patient had a PCN reaction causing severe rash involving mucus membranes or skin necrosis: unknown Has patient had a PCN reaction that required hospitalization: unknown Has patient had a PCN reaction occurring within the last 10 years: no If all of the above answers are "NO", then may proceed with Cephalosporin use.   . Demerol  [Meperidine Hcl] Other (See Comments)    REACTION:  Increases blood pressure  . Demerol [Meperidine] Other (See Comments)    REACTION:  Increases blood pressure    ROS Review of Systems  Constitutional: Negative.   HENT: Negative.   Eyes: Negative.   Respiratory: Negative.   Cardiovascular: Negative.   Gastrointestinal: Negative.   Genitourinary: Negative.   Musculoskeletal: Positive for arthralgias.  Skin: Negative.   Psychiatric/Behavioral: Positive for dysphoric mood.       Objective:    Physical Exam  Constitutional: He is oriented to person, place, and time. He appears well-developed and well-nourished.  HENT:  Head: Normocephalic.  Eyes: Pupils are equal, round, and reactive to light.  Cardiovascular: Normal rate and regular rhythm.  No murmur heard. Bilateral lower extremity edema, 2+  Pulmonary/Chest: Effort normal and breath sounds normal.  Abdominal: Soft. Bowel sounds are normal.  Abdominal obesity, tender to touch, erythematous, pendulous abdomen  Musculoskeletal:     Right knee: Decreased range of motion.     Left knee: Decreased range of motion.  Neurological: He is alert and oriented to person, place, and time.  Skin: Skin is warm.  Psychiatric: He has a normal mood and affect. His behavior is normal. Judgment and thought content normal.    BP 140/88 (BP Location: Right Arm,  Patient Position: Sitting, Cuff Size: Large)   Pulse 74   Temp 98.9 F (37.2 C) (Oral)   Resp 16   Ht 6\' 1"  (1.854 m)   Wt (!) 333 lb (151 kg)   SpO2 99%   BMI 43.93 kg/m  Wt Readings from Last 3 Encounters:  11/06/19 (!) 333 lb (151 kg)  08/15/19 (!) 335 lb (152 kg)  06/25/19 (!) 339 lb 6.4 oz (154 kg)     Health Maintenance Due  Topic Date Due  . COLONOSCOPY  Never done    There are no preventive care reminders to display for this patient.  Lab Results  Component Value Date   TSH 2.550 12/20/2018   Lab Results  Component Value Date   WBC 13.7 (H) 04/28/2019   HGB 16.3 04/28/2019   HCT 50.2 04/28/2019   MCV 88.8 04/28/2019   PLT 247 04/28/2019   Lab Results  Component Value Date   NA 134 (L) 04/28/2019   K 3.5 04/28/2019   CO2 23 04/28/2019   GLUCOSE 183 (H) 04/28/2019   BUN 13 04/28/2019   CREATININE 1.06 04/28/2019   BILITOT 0.6 12/20/2018   ALKPHOS 54 12/20/2018   AST 30 12/20/2018   ALT 50 (H) 12/20/2018   PROT 7.1 12/20/2018   ALBUMIN 4.4 12/20/2018   CALCIUM 8.7 (L) 04/28/2019   ANIONGAP 9 04/28/2019   Lab Results  Component Value Date   CHOL 122 12/20/2018   Lab Results  Component Value Date   HDL 38 (L) 12/20/2018   Lab Results  Component Value Date   LDLCALC 62 12/20/2018   Lab Results  Component Value Date   TRIG 110 12/20/2018   Lab Results  Component Value Date   CHOLHDL 3.2 12/20/2018   Lab Results  Component Value Date   HGBA1C 5.8 (A) 05/28/2019      Assessment & Plan:   Problem List Items Addressed This Visit      Cardiovascular and Mediastinum   Acute venous embolism and thrombosis of deep vessels of proximal end of left lower extremity (HCC)   Relevant Medications   furosemide (LASIX) 20 MG tablet     Other   Morbid obesity with BMI of 40.0-44.9, adult ([place])   History of DVT (deep vein thrombosis)    Other Visit Diagnoses  Arthralgia, unspecified joint    -  Primary   Relevant Orders   Arthritis Panel  (Completed)   Excessive daytime sleepiness       Relevant Orders   Split night study   Other fatigue       Relevant Orders   Comprehensive metabolic panel (Completed)   Thyroid Panel With TSH (Completed)   CBC with Differential (Completed)   Localized edema       Relevant Medications   furosemide (LASIX) 20 MG tablet   Other Relevant Orders   Brain natriuretic peptide (Completed)   Early satiety       Chronic pain of both knees       Relevant Orders   AMB referral to orthopedics     Arthralgia, unspecified joint - Arthritis Panel  Excessive daytime sleepiness Patient is complaining of excessive daytime sleepiness.  He states that he typically gets 3 to 4 hours of sleep per night.  At this juncture, patient warrants a split-night sleep study.  To schedule with [facility] long sleep center.  Patient endorses shortness of breath with exertion. - Split night study; Future  Other fatigue - Comprehensive metabolic panel - Thyroid Panel With TSH - CBC with Differential  Localized edema Recommend the patient maintains a journal of daily weights.  Return in 1 week for blood pressure and weight check. - furosemide (LASIX) 20 MG tablet; Take 1 tablet (20 mg total) by mouth daily.  Dispense: 30 tablet; Refill: 3 - Brain natriuretic peptide   Morbid obesity with BMI of 40.0-44.9, adult [facility]) The patient is asked to make an attempt to improve diet and exercise patterns to aid in medical management of this problem.  History of DVT (deep vein thrombosis) Patient has a long history of PE and DVTs.  He is on chronic anticoagulation.  Patient has discontinued medications due to suspected side effects.  Patient advised to resume medication and discussed the importance of taking medication consistently.  Patient has been on this particular medication for a number of years without side effects.  Patient advised to maintain a food journal to assess whether certain foods are triggering nausea and  vomiting.    Chronic pain of both knees Will send a new referral to orthopedic services for this problem.  Will defer to orthopedic specialist for further management and evaluation.  - AMB referral to orthopedics No orders of the defined types were placed in this encounter.   Follow-up: Return in about 2 weeks (around 11/20/2019).    Donia Pounds  APRN, MSN, FNP-C [facility] [address] [place], [place] [address] [phone]

## 2019-11-06 NOTE — Patient Instructions (Signed)
Split night sleep study Will call with laboratory results

## 2019-11-07 LAB — COMPREHENSIVE METABOLIC PANEL
ALT: 52 IU/L — ABNORMAL HIGH (ref 0–44)
AST: 43 IU/L — ABNORMAL HIGH (ref 0–40)
Albumin/Globulin Ratio: 1.7 (ref 1.2–2.2)
Albumin: 4.2 g/dL (ref 3.8–4.9)
Alkaline Phosphatase: 45 IU/L (ref 39–117)
BUN/Creatinine Ratio: 13 (ref 10–24)
BUN: 11 mg/dL (ref 8–27)
Bilirubin Total: 0.6 mg/dL (ref 0.0–1.2)
CO2: 19 mmol/L — ABNORMAL LOW (ref 20–29)
Calcium: 9.4 mg/dL (ref 8.6–10.2)
Chloride: 102 mmol/L (ref 96–106)
Creatinine, Ser: 0.86 mg/dL (ref 0.76–1.27)
GFR calc Af Amer: 109 mL/min/{1.73_m2} (ref >59)
GFR calc non Af Amer: 94 mL/min/{1.73_m2} (ref >59)
Globulin, Total: 2.5 g/dL (ref 1.5–4.5)
Glucose: 102 mg/dL — ABNORMAL HIGH (ref 65–99)
Potassium: 4.6 mmol/L (ref 3.5–5.2)
Sodium: 139 mmol/L (ref 134–144)
Total Protein: 6.7 g/dL (ref 6.0–8.5)

## 2019-11-07 LAB — CBC WITH DIFFERENTIAL/PLATELET
Basophils Absolute: 0.1 10*3/uL (ref 0.0–0.2)
Basos: 1 %
EOS (ABSOLUTE): 0 10*3/uL (ref 0.0–0.4)
Eos: 0 %
Hematocrit: 49.9 % (ref 37.5–51.0)
Hemoglobin: 16.5 g/dL (ref 13.0–17.7)
Immature Grans (Abs): 0 10*3/uL (ref 0.0–0.1)
Immature Granulocytes: 0 %
Lymphocytes Absolute: 2 10*3/uL (ref 0.7–3.1)
Lymphs: 18 %
MCH: 29.8 pg (ref 26.6–33.0)
MCHC: 33.1 g/dL (ref 31.5–35.7)
MCV: 90 fL (ref 79–97)
Monocytes Absolute: 0.8 10*3/uL (ref 0.1–0.9)
Monocytes: 7 %
Neutrophils Absolute: 8.5 10*3/uL — ABNORMAL HIGH (ref 1.4–7.0)
Neutrophils: 74 %
Platelets: 215 10*3/uL (ref 150–450)
RBC: 5.54 x10E6/uL (ref 4.14–5.80)
RDW: 12.6 % (ref 11.6–15.4)
WBC: 11.5 10*3/uL — ABNORMAL HIGH (ref 3.4–10.8)

## 2019-11-07 LAB — ARTHRITIS PANEL
Anti Nuclear Antibody (ANA): NEGATIVE
Rheumatoid fact SerPl-aCnc: <10 IU/mL (ref 0.0–13.9)
Sed Rate: 23 mm/hr (ref 0–30)
Uric Acid: 5.8 mg/dL (ref 3.8–8.4)

## 2019-11-07 LAB — THYROID PANEL WITH TSH
Free Thyroxine Index: 2.3 (ref 1.2–4.9)
T3 Uptake Ratio: 26 % (ref 24–39)
T4, Total: 8.8 ug/dL (ref 4.5–12.0)
TSH: 2.12 u[IU]/mL (ref 0.450–4.500)

## 2019-11-07 LAB — BRAIN NATRIURETIC PEPTIDE: BNP: 44.2 pg/mL (ref 0.0–100.0)

## 2019-11-08 ENCOUNTER — Other Ambulatory Visit: Payer: Self-pay

## 2019-11-08 ENCOUNTER — Other Ambulatory Visit: Payer: Self-pay | Admitting: Family Medicine

## 2019-11-08 ENCOUNTER — Telehealth: Payer: Self-pay

## 2019-11-08 DIAGNOSIS — R7401 Elevation of levels of liver transaminase levels: Secondary | ICD-10-CM

## 2019-11-08 DIAGNOSIS — Z6841 Body Mass Index (BMI) 40.0 and over, adult: Secondary | ICD-10-CM

## 2019-11-08 DIAGNOSIS — R1084 Generalized abdominal pain: Secondary | ICD-10-CM

## 2019-11-08 NOTE — Progress Notes (Signed)
Orders Placed This Encounter  Procedures  . US Abdomen Complete    Standing Status:   Future    Standing Expiration Date:   01/07/2021    Order Specific Question:   Reason for Exam (SYMPTOM  OR DIAGNOSIS REQUIRED)    Answer:   Transaminitis/abdominal discomfort    Order Specific Question:   Preferred imaging location?    Answer:   [facility]    Order Specific Question:   Release to patient    Answer:   Immediate  . Hepatitis panel, acute    Standing Status:   Future    Standing Expiration Date:   11/07/2020  . Amb ref to Medical Nutrition Therapy-MNT    Referral Priority:   Routine    Referral Type:   Consultation    Referral Reason:   Specialty Services Required    Requested Specialty:   Nutrition    Number of Visits Requested:   [facility], MSN, FNP-C [facility] [address] [place], [place] [address] [phone]

## 2019-11-08 NOTE — Telephone Encounter (Signed)
[name] stated that Cortisone injection by ortho is not covered by Medicaid. ([facility] Cortisone is not covered).    Patient has been advised to check around to find out who does the type of injection,  he is has requested. If further assistance is needed to call back.

## 2019-11-09 ENCOUNTER — Other Ambulatory Visit: Payer: Medicaid Other

## 2019-11-09 ENCOUNTER — Other Ambulatory Visit: Payer: Self-pay

## 2019-11-09 DIAGNOSIS — R7401 Elevation of levels of liver transaminase levels: Secondary | ICD-10-CM

## 2019-11-10 LAB — HEPATITIS PANEL, ACUTE
Hep A IgM: NEGATIVE
Hep B C IgM: NEGATIVE
Hep C Virus Ab: 0.1 s/co ratio (ref 0.0–0.9)
Hepatitis B Surface Ag: NEGATIVE

## 2019-11-13 ENCOUNTER — Ambulatory Visit: Payer: Medicaid Other | Admitting: Orthopaedic Surgery

## 2019-11-14 ENCOUNTER — Ambulatory Visit (HOSPITAL_COMMUNITY)
Admission: RE | Admit: 2019-11-14 | Discharge: 2019-11-14 | Disposition: A | Discharge status: Home / Self Care | Payer: Medicaid Other | Source: Ambulatory Visit | Attending: Family Medicine | Admitting: Family Medicine

## 2019-11-14 ENCOUNTER — Other Ambulatory Visit: Payer: Self-pay

## 2019-11-14 ENCOUNTER — Encounter: Payer: Self-pay | Admitting: Family Medicine

## 2019-11-14 DIAGNOSIS — K760 Fatty (change of) liver, not elsewhere classified: Secondary | ICD-10-CM | POA: Diagnosis not present

## 2019-11-14 DIAGNOSIS — R1084 Generalized abdominal pain: Secondary | ICD-10-CM | POA: Diagnosis not present

## 2019-11-14 DIAGNOSIS — R7401 Elevation of levels of liver transaminase levels: Secondary | ICD-10-CM | POA: Diagnosis not present

## 2019-11-14 NOTE — Progress Notes (Signed)
  Patient [facility]   Kurk Snay is a 59 year old male with a medical history significant for essential hypertension, coronary artery disease, history of acute venous embolism and thrombosis of deep vessels, transaminitis, morbid obesity, and history of phlebitis presented on 11/06/2019 with complaints of fatigue, abdominal pain, and abdominal bloating.  Patient underwent an abdominal ultrasound complete, which showed the following:   IMPRESSION: Hepatic steatosis as demonstrated on the prior CT scan. No acute Abnormalities.   Inform patient that hepatic steatosis is an intrahepatic storage of fat that is at least 5% of total liver weight.  Also, fat storage can lead to liver metabolic dysfunction and inflammation.  His risk factors include obesity and coronary artery disease.  Patient has signs and symptoms appear to be directly related to fatty liver disease.  His signs and symptoms include abdominal swelling, pronounced enlarged blood vessels on abdomen, and fatigue.  Treatment plan consists of reducing weight through diet and exercise programs.  Patient and I discussed at length.  Patient also advised to continue furosemide.  He will return to clinic on 11/21/2019 for labs, will assess potassium level at that time.  Patient advised to continue daily blood pressure checks and daily weights   Donia Pounds  APRN, MSN, FNP-C Patient [facility] [address] [place], [place] [address] [phone]

## 2019-11-19 ENCOUNTER — Telehealth: Payer: Self-pay | Admitting: Family Medicine

## 2019-11-19 ENCOUNTER — Other Ambulatory Visit: Payer: Self-pay | Admitting: Family Medicine

## 2019-11-19 DIAGNOSIS — R238 Other skin changes: Secondary | ICD-10-CM

## 2019-11-19 MED ORDER — ACETAMINOPHEN-CODEINE #3 300-30 MG PO TABS: 1.0000 | ORAL_TABLET | Freq: Three times a day (TID) | ORAL | 0 refills | Status: DC | PRN

## 2019-11-19 NOTE — Progress Notes (Signed)
Reviewed PDMP substance reporting system prior to prescribing opiate medications. No inconsistencies noted.   Meds ordered this encounter  Medications  . acetaminophen-codeine (TYLENOL #3) 300-30 MG tablet    Sig: Take 1 tablet by mouth every 8 (eight) hours as needed for moderate pain.    Dispense:  20 tablet    Refill:  0    Order Specific Question:   Supervising Provider    Answer:   Tresa Garter [id number]     Donia Pounds  APRN, MSN, FNP-C [facility] [address] [place], [place] [address] [phone]

## 2019-11-20 ENCOUNTER — Ambulatory Visit: Payer: Self-pay | Admitting: Family Medicine

## 2019-11-21 NOTE — Telephone Encounter (Signed)
Done

## 2019-11-30 ENCOUNTER — Other Ambulatory Visit: Payer: Self-pay | Admitting: Family Medicine

## 2019-11-30 DIAGNOSIS — Z7901 Long term (current) use of anticoagulants: Secondary | ICD-10-CM

## 2019-12-03 ENCOUNTER — Other Ambulatory Visit (HOSPITAL_COMMUNITY): Payer: Medicaid Other

## 2019-12-05 ENCOUNTER — Encounter (HOSPITAL_BASED_OUTPATIENT_CLINIC_OR_DEPARTMENT_OTHER): Payer: Medicaid Other | Admitting: Internal Medicine

## 2019-12-12 ENCOUNTER — Ambulatory Visit: Payer: Medicaid Other | Admitting: Dietician

## 2019-12-18 ENCOUNTER — Encounter: Payer: Self-pay | Admitting: Family Medicine

## 2019-12-18 ENCOUNTER — Ambulatory Visit (INDEPENDENT_AMBULATORY_CARE_PROVIDER_SITE_OTHER): Payer: Medicaid Other | Admitting: Family Medicine

## 2019-12-18 ENCOUNTER — Other Ambulatory Visit: Payer: Self-pay

## 2019-12-18 VITALS — BP 139/83 | HR 63 | Ht 73.0 in | Wt 239.2 lb

## 2019-12-18 DIAGNOSIS — Z86718 Personal history of other venous thrombosis and embolism: Secondary | ICD-10-CM | POA: Diagnosis not present

## 2019-12-18 DIAGNOSIS — D3502 Benign neoplasm of left adrenal gland: Secondary | ICD-10-CM | POA: Diagnosis not present

## 2019-12-18 DIAGNOSIS — R238 Other skin changes: Secondary | ICD-10-CM

## 2019-12-18 DIAGNOSIS — Z6841 Body Mass Index (BMI) 40.0 and over, adult: Secondary | ICD-10-CM

## 2019-12-18 DIAGNOSIS — R1907 Generalized intra-abdominal and pelvic swelling, mass and lump: Secondary | ICD-10-CM

## 2019-12-18 DIAGNOSIS — Z7901 Long term (current) use of anticoagulants: Secondary | ICD-10-CM | POA: Diagnosis not present

## 2019-12-18 DIAGNOSIS — I10 Essential (primary) hypertension: Secondary | ICD-10-CM | POA: Diagnosis not present

## 2019-12-18 MED ORDER — APIXABAN 5 MG PO TABS: 5.0000 mg | ORAL_TABLET | Freq: Two times a day (BID) | ORAL | 5 refills | Status: DC

## 2019-12-18 MED ORDER — ACETAMINOPHEN-CODEINE #3 300-30 MG PO TABS: 1.0000 | ORAL_TABLET | Freq: Three times a day (TID) | ORAL | 0 refills | Status: DC | PRN

## 2019-12-18 NOTE — Patient Instructions (Addendum)
You have a 2.7 cm left adrenal adenoma, which was an incidental finding on CT of your chest last year. We will obtain a CT of abdomen and pelvis. I will send a referral if warranted.   Will follow up by phone with lab results Adrenal Adenoma  An adrenal adenoma is a benign tumor of the glands that are located on top of each kidney (adrenal glands). These glands produce hormones. A benign tumor means that the growth is not cancer. A person may have one or more tumors in one or both glands. In almost all cases, adrenal adenomas do not cause any symptoms. These are called nonfunctional adenomas. In rare cases, an adenoma may produce high levels of hormones called cortisol or aldosterone. These tumors are called functional adenomas. Adrenal adenomas become more common as people grow older, but the tumors do not become cancer. However, nonfunctional adenomas may become functional. What are the causes? In most cases, the cause of this condition is not known. In very rare cases, the condition may be passed from parent to child (inherited). These include multiple endocrine neoplasia type 1 and familial adenomatous polyposis. These conditions cause adenomas in many areas of the body. What are the signs or symptoms? Symptoms of this condition depend on the type of adrenal adenoma that you have.  Nonfunctional adrenal adenomas usually do not cause any symptoms.  Symptoms of functional adrenal adenomas depend on which hormone is produced in high levels. ? Tumors that secrete cortisol cause a condition called Cushing's syndrome. Signs and symptoms include:  Increased fat in the upper body.  Tiredness and loss of energy.  Muscle weakness.  High blood pressure.  High blood sugar.  Bruising and purple stretch marks in the skin, usually on the upper body.  Facial hair, acne, and menstrual irregularities in women. ? Tumors that secrete aldosterone cause a condition called primary aldosteronism. Signs  and symptoms include:  High blood pressure that may be difficult to control.  Tiredness and loss of energy.  Headache.  Weakness or numbness.  Low potassium, low magnesium, or high sodium levels in your blood. How is this diagnosed? This condition may be diagnosed based on:  Your symptoms. Your health care provider may suspect the condition if you have signs and symptoms of a functional adenoma.  A physical exam.  Blood and urine tests to check for high levels of hormones.  Imaging studies to confirm the diagnosis. These may include: ? CT scan. ? MRI. ? PET scan.  Biopsy. For this test, a sample of the tumor is removed and examined in a lab. This is done in rare cases where other tests have not given a clear result. Adrenal adenomas are often found by chance when imaging studies of the abdomen are done for other reasons. How is this treated? Treatment for this condition depends on the type of the adrenal adenoma that you have. You may treated with:  Observation. This is done if you have a nonfunctional adrenal adenoma. For observation, you may need: ? Regular imaging studies to make sure the tumor is not growing. ? Blood or urine tests to make sure the tumor is not becoming functional.  Surgery. This is done if you have a functional adenoma. Surgery is the main treatment for this condition and usually cures it.  Medicines. These are used if surgery is not possible. The medicines block the effects of the hormones. Follow these instructions at home:  Take over-the-counter and prescription medicines only as told by  your health care provider.  Return to your normal activities as told by your health care provider. Ask your health care provider what activities are safe for you.  Keep all follow-up visits as told by your health care provider. This is important. This may include visits for regular tests and imaging studies. Contact a health care provider if:  You develop any of  the signs or symptoms of Cushing's syndrome or primary aldosteronism. Summary  An adrenal adenoma is a benign tumor of the adrenal gland.  Nonfunctional adenomas rarely cause symptoms and do not need to be treated.  Functional adenomas may cause symptoms of Cushing's syndrome or primary aldosteronism.  Adrenal adenomas do not become cancerous. Nonfunctional adenomas may become functional.  Surgery to remove the tumor is the usual treatment for functional adenomas. This information is not intended to replace advice given to you by your health care provider. Make sure you discuss any questions you have with your health care provider. Document Revised: 07/20/2018 Document Reviewed: 07/20/2018 Elsevier Patient Education  2020 [employer].

## 2019-12-18 NOTE — Progress Notes (Signed)
Patient [facility]   Established Patient Office Visit  Subjective:  Patient ID: Clayton Pena, male    DOB: [birth year]  Age: 59 y.o. MRN: [id number]  CC:  Chief Complaint  Patient presents with  . Follow-up    routine 3 month check up. Requsting refill on eliquis and tylenol 3  . Hypertension    HPI Frantz Ciardi is a very pleasant 59 year old male with a medical history significant for essential hypertension, coronary artery disease, history of DVT, history of gout, morbid obesity with BMI of 40-40 4.9, history of fatty liver disease, and history of left adrenal adenoma presents for follow-up of chronic conditions.  Patient states that he has been doing very well and is without complaint on today. Hypertension: Patient has been taking prescribed medications consistently.  He checks blood pressure at home, which has been mostly controlled.  Patient has been keeping journal of daily weights, which fluctuate.  Pain reports some lower extremity edema.  He denies headache, chest pain, heart palpitations, shortness of breath, or orthopnea.  Left adrenal adenoma: On 05/25/2019 patient underwent a CT angiogram of chest.  An incidental finding of a 2.7 cm left adenoma.  At that time, recommended surveillance.  Patient continues to endorse some fatigue, weight gain, and increased bruising.  Past Medical History:  Diagnosis Date  . Acute suppr otitis media w/o spon rupt ear drum, right ear 05/2019  . Angina at rest [facility])   . Coronary artery disease     Past Surgical History:  Procedure Laterality Date  . BACK SURGERY    . CORONARY ANGIOPLASTY    . HERNIA REPAIR      Family History  Problem Relation Age of Onset  . CAD Mother   . Diabetes Mother   . CAD Father   . Prostate cancer Father   . Diabetes Brother   . Stroke Neg Hx     Social History   Socioeconomic History  . Marital status: Married    Spouse name: Not on file  . Number of  children: Not on file  . Years of education: Not on file  . Highest education level: Not on file  Occupational History  . Not on file  Tobacco Use  . Smoking status: Never Smoker  . Smokeless tobacco: Never Used  Substance and Sexual Activity  . Alcohol use: No    Comment: occasional  . Drug use: No  . Sexual activity: Yes    Partners: Female  Other Topics Concern  . Not on file  Social History Narrative  . Not on file   Social Determinants of Health   Financial Resource Strain:   . Difficulty of Paying Living Expenses:   Food Insecurity:   . Worried About Charity fundraiser in the Last Year:   . Arboriculturist in the Last Year:   Transportation Needs:   . Film/video editor (Medical):   Marland Kitchen Lack of Transportation (Non-Medical):   Physical Activity:   . Days of Exercise per Week:   . Minutes of Exercise per Session:   Stress:   . Feeling of Stress :   Social Connections:   . Frequency of Communication with Friends and Family:   . Frequency of Social Gatherings with Friends and Family:   . Attends Religious Services:   . Active Member of Clubs or Organizations:   . Attends Archivist Meetings:   Marland Kitchen Marital Status:   Intimate Production manager  Violence:   . Fear of Current or Ex-Partner:   . Emotionally Abused:   Marland Kitchen Physically Abused:   . Sexually Abused:     Outpatient Medications Prior to Visit  Medication Sig Dispense Refill  . acetaminophen-codeine (TYLENOL #3) 300-30 MG tablet Take 1 tablet by mouth every 8 (eight) hours as needed for moderate pain. 20 tablet 0  . albuterol (VENTOLIN HFA) 108 (90 Base) MCG/ACT inhaler Inhale 2 puffs into the lungs every 6 (six) hours as needed for wheezing or shortness of breath. 18 g 3  . cetirizine (ZYRTEC) 10 MG tablet Take 1 tablet (10 mg total) by mouth at bedtime. 30 tablet 11  . colchicine 0.6 MG tablet Take 1 tablet (0.6 mg total) by mouth every 6 (six) hours as needed. As needed for Gouty Episodes. (Patient not  taking: Reported on 11/06/2019) 30 tablet 3  . Elastic Bandages & Supports (MEDICAL COMPRESSION STOCKINGS) MISC 1 each by Does not apply route daily. 1 each 2  . ELIQUIS 5 MG TABS tablet Take 1 tablet by mouth twice daily 60 tablet 0  . ferrous sulfate 325 (65 FE) MG tablet Take 1 tablet (325 mg total) by mouth daily with breakfast. (Patient not taking: Reported on 11/06/2019) 30 tablet 3  . furosemide (LASIX) 20 MG tablet Take 1 tablet (20 mg total) by mouth daily. 30 tablet 3  . hydrochlorothiazide (HYDRODIURIL) 25 MG tablet Take 1 tablet (25 mg total) by mouth daily. 30 tablet 5  . ibuprofen (ADVIL) 200 MG tablet Take 600 mg by mouth every 6 (six) hours as needed for moderate pain.    . sodium chloride (OCEAN) 0.65 % SOLN nasal spray Place 1 spray into both nostrils as needed for congestion.    Marland Kitchen zinc gluconate 50 MG tablet Take 1 tablet (50 mg total) by mouth daily. 30 tablet 3   No facility-administered medications prior to visit.    Allergies  Allergen Reactions  . Penicillins Anaphylaxis    Has patient had a PCN reaction causing immediate rash, facial/tongue/throat swelling, SOB or lightheadedness with hypotension: unknown Has patient had a PCN reaction causing severe rash involving mucus membranes or skin necrosis: unknown Has patient had a PCN reaction that required hospitalization: unknown Has patient had a PCN reaction occurring within the last 10 years: no If all of the above answers are "NO", then may proceed with Cephalosporin use.   . Demerol  [Meperidine Hcl] Other (See Comments)    REACTION:  Increases blood pressure  . Demerol [Meperidine] Other (See Comments)    REACTION:  Increases blood pressure    ROS Review of Systems  Constitutional: Positive for fatigue.  HENT: Negative.   Eyes: Negative.   Respiratory: Negative.   Cardiovascular: Negative.   Gastrointestinal: Negative.   Endocrine: Negative.   Genitourinary: Negative.   Musculoskeletal: Positive for  arthralgias.  Skin: Negative.   Allergic/Immunologic: Negative.   Neurological: Negative.   Psychiatric/Behavioral: Negative.       Objective:    Physical Exam  Constitutional: He is oriented to person, place, and time. He appears well-developed and well-nourished.  HENT:  Head: Normocephalic.  Cardiovascular: Normal rate and regular rhythm.  No murmur heard. Pulses:      Dorsalis pedis pulses are 2+ on the right side and 2+ on the left side.  Lower extremity edema 2+  Pulmonary/Chest: Effort normal.  Abdominal: Soft. Bowel sounds are normal.  Abdominal obesity, pendulous   Musculoskeletal:        General: Normal  range of motion.     Cervical back: Normal range of motion.  Neurological: He is alert and oriented to person, place, and time.  Skin: Skin is warm.  Psychiatric: He has a normal mood and affect. His behavior is normal. Judgment and thought content normal.    There were no vitals taken for this visit. Wt Readings from Last 3 Encounters:  11/06/19 (!) 333 lb (151 kg)  08/15/19 (!) 335 lb (152 kg)  06/25/19 (!) 339 lb 6.4 oz (154 kg)     Health Maintenance Due  Topic Date Due  . COVID-19 Vaccine (1) Never done  . COLONOSCOPY  Never done    There are no preventive care reminders to display for this patient.  Lab Results  Component Value Date   TSH 2.120 11/06/2019   Lab Results  Component Value Date   WBC 11.5 (H) 11/06/2019   HGB 16.5 11/06/2019   HCT 49.9 11/06/2019   MCV 90 11/06/2019   PLT 215 11/06/2019   Lab Results  Component Value Date   NA 139 11/06/2019   K 4.6 11/06/2019   CO2 19 (L) 11/06/2019   GLUCOSE 102 (H) 11/06/2019   BUN 11 11/06/2019   CREATININE 0.86 11/06/2019   BILITOT 0.6 11/06/2019   ALKPHOS 45 11/06/2019   AST 43 (H) 11/06/2019   ALT 52 (H) 11/06/2019   PROT 6.7 11/06/2019   ALBUMIN 4.2 11/06/2019   CALCIUM 9.4 11/06/2019   ANIONGAP 9 04/28/2019   Lab Results  Component Value Date   CHOL 122 12/20/2018    Lab Results  Component Value Date   HDL 38 (L) 12/20/2018   Lab Results  Component Value Date   LDLCALC 62 12/20/2018   Lab Results  Component Value Date   TRIG 110 12/20/2018   Lab Results  Component Value Date   CHOLHDL 3.2 12/20/2018   Lab Results  Component Value Date   HGBA1C 5.8 (A) 05/28/2019      Assessment & Plan:   Problem List Items Addressed This Visit      Cardiovascular and Mediastinum   Essential hypertension - Primary     Other   History of DVT (deep vein thrombosis)   Class 3 severe obesity due to excess calories with serious comorbidity and body mass index (BMI) of 40.0 to 44.9 in adult [facility])    Other Visit Diagnoses    Adrenal adenoma, left          Essential hypertension BP 139/83 (BP Location: Left Arm, Patient Position: Sitting, Cuff Size: Large)   Pulse 63   Ht 6\' 1"  (1.854 m)   Wt 239 lb 4 oz (108.5 kg)   SpO2 95%   BMI 31.57 kg/m  Blood pressures controlled on current medication regimen.  No changes warranted on today. - Basic Metabolic Panel  Class 3 severe obesity due to excess calories with serious comorbidity and body mass index (BMI) of 40.0 to 44.9 in adult [facility]) The patient is asked to make an attempt to improve diet and exercise patterns to aid in medical management of this problem.  History of DVT (deep vein thrombosis) We will continue Eliquis as previously prescribed  Adrenal adenoma, left Patient has 2.7 cm adrenal adenoma, left.  Will repeat CT abdomen and pelvis for surveillance.  Reviewed results as they become available. - CT Abdomen Pelvis W Contrast; Future  Chronic anticoagulation - apixaban (ELIQUIS) 5 MG TABS tablet; Take 1 tablet (5 mg total) by mouth 2 (two) times  daily.  Dispense: 60 tablet; Refill: 5   Follow-up: Return in about 3 months (around 03/19/2020) for hypertension.    Donia Pounds  APRN, MSN, FNP-C Patient [facility] [address] [place],  [place] [address] [phone]

## 2019-12-19 ENCOUNTER — Telehealth: Payer: Self-pay

## 2019-12-19 LAB — BASIC METABOLIC PANEL
BUN/Creatinine Ratio: 18 (ref 10–24)
BUN: 19 mg/dL (ref 8–27)
CO2: 22 mmol/L (ref 20–29)
Calcium: 9.3 mg/dL (ref 8.6–10.2)
Chloride: 101 mmol/L (ref 96–106)
Creatinine, Ser: 1.04 mg/dL (ref 0.76–1.27)
GFR calc Af Amer: 90 mL/min/{1.73_m2} (ref >59)
GFR calc non Af Amer: 78 mL/min/{1.73_m2} (ref >59)
Glucose: 121 mg/dL — ABNORMAL HIGH (ref 65–99)
Potassium: 3.6 mmol/L (ref 3.5–5.2)
Sodium: 142 mmol/L (ref 134–144)

## 2019-12-19 NOTE — Telephone Encounter (Signed)
-----   Message from Dorena Dew, [place] sent at 12/19/2019  6:22 AM EDT ----- Regarding: lab results and schedule CT Please inform patient that creatinine, an indicator of kidney functioning is within a normal range. No medication changes are warranted on today. Please scheduled patient's CT of abdomen/pelvis for surveillance of adrenal adenoma. He and I will schedule a phone visit to review CT results when it becomes available. Remind patient to follow up with dietitian as scheduled.   Donia Pounds  APRN, MSN, FNP-C Patient [facility] [address] [place], [place] [address] [phone]

## 2019-12-19 NOTE — Telephone Encounter (Signed)
Patient informed. 

## 2019-12-26 NOTE — Telephone Encounter (Signed)
DOT forms faxed. Copy to chart and copy mailed to patient.

## 2020-01-01 ENCOUNTER — Ambulatory Visit: Payer: Medicaid Other | Admitting: Skilled Nursing Facility1

## 2020-01-02 ENCOUNTER — Ambulatory Visit (HOSPITAL_COMMUNITY): Payer: Medicaid Other

## 2020-01-02 ENCOUNTER — Telehealth: Payer: Self-pay | Admitting: Family Medicine

## 2020-01-02 NOTE — Telephone Encounter (Signed)
Pt has not had abdomin CT due to insurance. Pt has not rescheduled yet.

## 2020-03-18 ENCOUNTER — Ambulatory Visit: Payer: Self-pay | Admitting: Family Medicine

## 2020-08-12 ENCOUNTER — Telehealth: Payer: Self-pay | Admitting: Family Medicine

## 2020-08-12 ENCOUNTER — Telehealth (HOSPITAL_COMMUNITY): Payer: Self-pay | Admitting: Family Medicine

## 2020-08-12 DIAGNOSIS — U071 COVID-19: Secondary | ICD-10-CM

## 2020-08-12 DIAGNOSIS — R238 Other skin changes: Secondary | ICD-10-CM

## 2020-08-12 DIAGNOSIS — R112 Nausea with vomiting, unspecified: Secondary | ICD-10-CM

## 2020-08-12 DIAGNOSIS — R197 Diarrhea, unspecified: Secondary | ICD-10-CM

## 2020-08-12 MED ORDER — ACETAMINOPHEN-CODEINE #3 300-30 MG PO TABS: 1.0000 | ORAL_TABLET | Freq: Three times a day (TID) | ORAL | 0 refills | Status: DC | PRN

## 2020-08-12 MED ORDER — ONDANSETRON HCL 4 MG PO TABS: 4.0000 mg | ORAL_TABLET | Freq: Every day | ORAL | 0 refills | Status: DC | PRN

## 2020-08-12 NOTE — Telephone Encounter (Signed)
Clayton Pena is a 60 year old male with a history of DVT, hypertension, and morbid obesity called with complaints of worsening covid 19 symptoms. Patient says that he was diagnosed with COVID on 08/08/20. He has complaints of headache, nausea, vomiting, diarrhea, body aches, and fatigue.  Patient is requesting medications for supportive care. The following medications have been sent to patient's pharmacy.  He has also been advised to make a follow-up appointment with this provider over the next several weeks.  The patient was given clear instructions to go to ER or return to medical center if symptoms do not improve, worsen or new problems develop. The patient verbalized understanding.     Meds ordered this encounter  Medications  . ondansetron (ZOFRAN) 4 MG tablet    Sig: Take 1 tablet (4 mg total) by mouth daily as needed for nausea or vomiting.    Dispense:  30 tablet    Refill:  0    Order Specific Question:   Supervising Provider    Answer:   Tresa Garter [id number]  . acetaminophen-codeine (TYLENOL #3) 300-30 MG tablet    Sig: Take 1 tablet by mouth every 8 (eight) hours as needed for moderate pain.    Dispense:  15 tablet    Refill:  0    Order Specific Question:   Supervising Provider    Answer:   Tresa Garter [[id number]]    Donia Pounds  APRN, MSN, FNP-C Patient [facility] [address] [place], [place] [address] [phone]

## 2020-08-13 ENCOUNTER — Other Ambulatory Visit: Payer: Self-pay | Admitting: Family Medicine

## 2020-08-13 DIAGNOSIS — R238 Other skin changes: Secondary | ICD-10-CM

## 2020-08-13 DIAGNOSIS — R197 Diarrhea, unspecified: Secondary | ICD-10-CM

## 2020-08-13 DIAGNOSIS — R112 Nausea with vomiting, unspecified: Secondary | ICD-10-CM

## 2020-08-13 MED ORDER — ACETAMINOPHEN-CODEINE #3 300-30 MG PO TABS: 1.0000 | ORAL_TABLET | Freq: Four times a day (QID) | ORAL | 0 refills | Status: DC | PRN

## 2020-08-13 MED ORDER — ONDANSETRON HCL 4 MG PO TABS: 4.0000 mg | ORAL_TABLET | Freq: Every day | ORAL | 0 refills | Status: AC | PRN

## 2020-08-13 NOTE — Telephone Encounter (Signed)
Sent to provider 

## 2020-12-23 ENCOUNTER — Other Ambulatory Visit: Payer: Self-pay | Admitting: Nurse Practitioner

## 2020-12-23 ENCOUNTER — Other Ambulatory Visit: Payer: Self-pay | Admitting: Family Medicine

## 2020-12-23 DIAGNOSIS — Z86718 Personal history of other venous thrombosis and embolism: Secondary | ICD-10-CM

## 2020-12-23 DIAGNOSIS — R600 Localized edema: Secondary | ICD-10-CM

## 2020-12-23 DIAGNOSIS — I10 Essential (primary) hypertension: Secondary | ICD-10-CM

## 2020-12-23 DIAGNOSIS — Z7901 Long term (current) use of anticoagulants: Secondary | ICD-10-CM

## 2021-02-13 DIAGNOSIS — H5213 Myopia, bilateral: Secondary | ICD-10-CM | POA: Diagnosis not present

## 2021-03-13 ENCOUNTER — Telehealth: Payer: Self-pay

## 2021-03-13 ENCOUNTER — Other Ambulatory Visit: Payer: Self-pay | Admitting: Family Medicine

## 2021-03-13 DIAGNOSIS — R238 Other skin changes: Secondary | ICD-10-CM

## 2021-03-13 MED ORDER — ACETAMINOPHEN-CODEINE #3 300-30 MG PO TABS: 1.0000 | ORAL_TABLET | Freq: Four times a day (QID) | ORAL | 0 refills | Status: DC | PRN

## 2021-03-13 NOTE — Telephone Encounter (Signed)
Med Refill :: Tylenol # 3  Pt also had Covid last 3 days and is asking on what med he needs?  [facility] in [place]

## 2021-03-13 NOTE — Progress Notes (Signed)
Reviewed PDMP substance reporting system prior to prescribing opiate medications. No inconsistencies noted.    Meds ordered this encounter  Medications   acetaminophen-codeine (TYLENOL #3) 300-30 MG tablet    Sig: Take 1 tablet by mouth every 6 (six) hours as needed for moderate pain.    Dispense:  15 tablet    Refill:  0    Order Specific Question:   Supervising Provider    Answer:   Tresa Garter [id number]    Donia Pounds  APRN, MSN, FNP-C Patient [facility] [address] [place], [place] [address] [phone]

## 2021-03-16 ENCOUNTER — Telehealth (INDEPENDENT_AMBULATORY_CARE_PROVIDER_SITE_OTHER): Payer: Medicaid Other | Admitting: Nurse Practitioner

## 2021-03-16 ENCOUNTER — Encounter: Payer: Self-pay | Admitting: Nurse Practitioner

## 2021-03-16 DIAGNOSIS — U071 COVID-19: Secondary | ICD-10-CM | POA: Diagnosis not present

## 2021-03-16 MED ORDER — BENZONATATE 100 MG PO CAPS: 100.0000 mg | ORAL_CAPSULE | Freq: Two times a day (BID) | ORAL | 0 refills | Status: DC | PRN

## 2021-03-16 MED ORDER — PREDNISONE 10 MG PO TABS: ORAL_TABLET | ORAL | 0 refills | Status: DC

## 2021-03-16 MED ORDER — DOXYCYCLINE HYCLATE 100 MG PO TABS: 100.0000 mg | ORAL_TABLET | Freq: Two times a day (BID) | ORAL | 0 refills | Status: AC

## 2021-03-16 NOTE — Patient Instructions (Addendum)
Covid 19 Cough:   Stay well hydrated  Stay active  Deep breathing exercises  May take tylenol or fever or pain  May take mucinex DM twice daily  Will order chest x ray: [facility]      Follow up:  Follow up in 2 weeks or sooner if needed

## 2021-03-16 NOTE — Progress Notes (Signed)
Virtual Visit via Telephone Note  I connected with [name] on 03/16/21 at  1:30 PM EDT by telephone and verified that I am speaking with the correct person using two identifiers.  Location: Patient: home Provider: office   I discussed the limitations, risks, security and privacy concerns of performing an evaluation and management service by telephone and the availability of in person appointments. I also discussed with the patient that there may be a patient responsible charge related to this service. The patient expressed understanding and agreed to proceed.   History of Present Illness:  Patient presents today for post-COVID care clinic visit.  Patient tested positive for COVID on 03/11/2021.  He states that his symptoms started the same day.  Patient has been staying active.  He states that his blood pressure has been slightly elevated.  He complains of joint pain and cough. Denies f/c/s, n/v/d, hemoptysis, PND, chest pain or edema.    Observations/Objective:  Vitals with BMI 12/18/2019 11/06/2019 08/15/2019  Height '6\' 1"'$  '6\' 1"'$  '6\' 1"'$   Weight 239 lbs 4 oz 333 lbs 335 lbs  BMI 31.57 [id number]  Systolic [id number]  Diastolic 83 88 90  Pulse 63 74 92      Assessment and Plan:  Covid 19 Cough:   Stay well hydrated  Stay active  Deep breathing exercises  May take tylenol or fever or pain  May take mucinex DM twice daily  Will order chest x ray: [facility]    Follow up:  Follow up in 2 weeks or sooner if needed    I discussed the assessment and treatment plan with the patient. The patient was provided an opportunity to ask questions and all were answered. The patient agreed with the plan and demonstrated an understanding of the instructions.   The patient was advised to call back or seek an in-person evaluation if the symptoms worsen or if the condition fails to improve as anticipated.  I provided 23 minutes of non-face-to-face time during this  encounter.   Fenton Foy, NP

## 2021-04-07 ENCOUNTER — Other Ambulatory Visit: Payer: Self-pay | Admitting: Family Medicine

## 2021-04-07 DIAGNOSIS — Z7901 Long term (current) use of anticoagulants: Secondary | ICD-10-CM

## 2021-04-07 DIAGNOSIS — I10 Essential (primary) hypertension: Secondary | ICD-10-CM

## 2021-04-07 DIAGNOSIS — R600 Localized edema: Secondary | ICD-10-CM

## 2021-04-07 DIAGNOSIS — Z86718 Personal history of other venous thrombosis and embolism: Secondary | ICD-10-CM

## 2021-05-15 ENCOUNTER — Other Ambulatory Visit: Payer: Self-pay | Admitting: Family Medicine

## 2021-05-15 DIAGNOSIS — R238 Other skin changes: Secondary | ICD-10-CM

## 2021-05-28 ENCOUNTER — Other Ambulatory Visit: Payer: Self-pay | Admitting: Family Medicine

## 2021-05-28 DIAGNOSIS — R238 Other skin changes: Secondary | ICD-10-CM

## 2021-08-03 ENCOUNTER — Encounter (HOSPITAL_COMMUNITY): Payer: Self-pay

## 2021-08-03 ENCOUNTER — Other Ambulatory Visit: Payer: Self-pay

## 2021-08-03 ENCOUNTER — Emergency Department (HOSPITAL_COMMUNITY)
Admission: EM | Admit: 2021-08-03 | Discharge: 2021-08-03 | Disposition: A | Discharge status: Home / Self Care | Payer: Medicaid Other | Attending: Emergency Medicine | Admitting: Emergency Medicine

## 2021-08-03 DIAGNOSIS — R059 Cough, unspecified: Secondary | ICD-10-CM | POA: Diagnosis present

## 2021-08-03 DIAGNOSIS — Z20822 Contact with and (suspected) exposure to covid-19: Secondary | ICD-10-CM | POA: Insufficient documentation

## 2021-08-03 DIAGNOSIS — J101 Influenza due to other identified influenza virus with other respiratory manifestations: Secondary | ICD-10-CM | POA: Insufficient documentation

## 2021-08-03 DIAGNOSIS — J111 Influenza due to unidentified influenza virus with other respiratory manifestations: Secondary | ICD-10-CM | POA: Diagnosis not present

## 2021-08-03 LAB — RESP PANEL BY RT-PCR (FLU A&B, COVID) ARPGX2
Influenza A by PCR: POSITIVE — AB
Influenza B by PCR: NEGATIVE
SARS Coronavirus 2 by RT PCR: NEGATIVE

## 2021-08-03 MED ORDER — OSELTAMIVIR PHOSPHATE 75 MG PO CAPS: 75.0000 mg | ORAL_CAPSULE | Freq: Two times a day (BID) | ORAL | 0 refills | Status: DC

## 2021-08-03 NOTE — Discharge Instructions (Signed)
You have the flu, start you on Tamiflu, recommend over-the-counter pain medications like ibuprofen Tylenol for fever and pain control, nasal decongestions like Flonase and Zyrtec, Mucinex for cough.  If not eating recommend supplementing with Gatorade to help with electrolyte supplementation.  Follow-up PCP for further evaluation.  Come back to the emergency department if you develop chest pain, shortness of breath, severe abdominal pain, uncontrolled nausea, vomiting, diarrhea.

## 2021-08-03 NOTE — ED Provider Notes (Signed)
[facility] Provider Note   CSN: [id number] Arrival date & time: 08/03/21  0930     History  Chief Complaint  Patient presents with   Cough    Clayton Pena is a 61 y.o. male.  HPI  Patient with medical history including CAD, DVTs, currently on Eliquis presents to the emergency department chief complaint of URI-like symptoms.  Patient states symptoms started yesterday, he endorses fevers, chills, nasal congestion, sore throat, nonproductive cough, and general body aches.  He does not endorse chest pain, wheezing, chest tightness, stomach pains, nausea, vomit, diarrhea, states she still tolerating p.o.  He is not immunocompromise, is not vaccinated COVID or influenza, endorses recent sick contacts, his daughter had an illness similar to this.  He has been taking over-the-counter medications seem to help with his symptoms.  Home Medications Prior to Admission medications   Medication Sig Start Date End Date Taking? Authorizing Provider  oseltamivir (TAMIFLU) 75 MG capsule Take 1 capsule (75 mg total) by mouth every 12 (twelve) hours. 08/03/21  Yes Marcello Fennel, PA-C  acetaminophen-codeine (TYLENOL #3) 300-30 MG tablet Take 1 tablet by mouth every 6 (six) hours as needed for moderate pain. 05/28/21   Dorena Dew, FNP  albuterol (VENTOLIN HFA) 108 (90 Base) MCG/ACT inhaler Inhale 2 puffs into the lungs every 6 (six) hours as needed for wheezing or shortness of breath. 05/07/19   Dorena Dew, FNP  benzonatate (TESSALON) 100 MG capsule Take 1 capsule (100 mg total) by mouth 2 (two) times daily as needed for cough. 03/16/21   Fenton Foy, NP  cetirizine (ZYRTEC) 10 MG tablet Take 1 tablet (10 mg total) by mouth at bedtime. 12/08/18   Lanae Boast, FNP  colchicine 0.6 MG tablet Take 1 tablet (0.6 mg total) by mouth every 6 (six) hours as needed. As needed for Gouty Episodes. Patient not taking: Reported on 11/06/2019 06/25/19   Azzie Glatter, FNP   Elastic Bandages & Supports ([facility]) [place] 1 each by Does not apply route daily. 12/07/16   Dorena Dew, FNP  ELIQUIS 5 MG TABS tablet Take 1 tablet by mouth twice daily 04/07/21   Dorena Dew, FNP  ferrous sulfate 325 (65 FE) MG tablet Take 1 tablet (325 mg total) by mouth daily with breakfast. Patient not taking: Reported on 11/06/2019 04/17/19   Dorena Dew, FNP  furosemide (LASIX) 20 MG tablet Take 1 tablet by mouth once daily 04/07/21   Dorena Dew, FNP  hydrochlorothiazide (HYDRODIURIL) 25 MG tablet Take 1 tablet by mouth once daily 04/07/21   Dorena Dew, FNP  ibuprofen (ADVIL) 200 MG tablet Take 600 mg by mouth every 6 (six) hours as needed for moderate pain.    [provider]  ondansetron (ZOFRAN) 4 MG tablet Take 1 tablet (4 mg total) by mouth daily as needed for nausea or vomiting. 08/13/20 08/13/21  Dorena Dew, FNP  predniSONE (DELTASONE) 10 MG tablet Take 4 tabs for 2 days, then 3 tabs for 2 days, then 2 tabs for 2 days, then 1 tab for 2 days, then stop 03/16/21   Fenton Foy, NP  sodium chloride (OCEAN) 0.65 % SOLN nasal spray Place 1 spray into both nostrils as needed for congestion.    [provider]  zinc gluconate 50 MG tablet Take 1 tablet (50 mg total) by mouth daily. 02/27/18   Lanae Boast, FNP      Allergies    Penicillins,  Demerol  [meperidine hcl], and Demerol [meperidine]    Review of Systems   Review of Systems  Constitutional:  Positive for chills and fever.  HENT:  Positive for congestion and sore throat.   Respiratory:  Positive for cough. Negative for shortness of breath.   Cardiovascular:  Negative for chest pain.  Gastrointestinal:  Negative for abdominal pain.  Musculoskeletal:  Negative for myalgias.  Neurological:  Negative for headaches.   Physical Exam Updated Vital Signs BP (!) 168/95 (BP Location: Right Arm)    Pulse 99    Temp (!) 100.6 F (38.1 C) (Oral)    Resp 18    Ht  6' (1.829 m)    Wt (!) 149.7 kg    SpO2 94%    BMI 44.76 kg/m  Physical Exam Vitals and nursing note reviewed.  Constitutional:      General: He is not in acute distress.    Appearance: He is not ill-appearing.  HENT:     Head: Normocephalic and atraumatic.     Right Ear: Tympanic membrane, ear canal and external ear normal.     Left Ear: Tympanic membrane, ear canal and external ear normal.     Nose: Congestion present.     Comments: Erythematous turbinates    Mouth/Throat:     Mouth: Mucous membranes are moist.     Pharynx: Oropharynx is clear. No oropharyngeal exudate or posterior oropharyngeal erythema.  Eyes:     Conjunctiva/sclera: Conjunctivae normal.  Cardiovascular:     Rate and Rhythm: Normal rate and regular rhythm.     Pulses: Normal pulses.     Heart sounds: No murmur heard.   No friction rub. No gallop.  Pulmonary:     Effort: No respiratory distress.     Breath sounds: No wheezing, rhonchi or rales.  Abdominal:     Palpations: Abdomen is soft.     Tenderness: There is no abdominal tenderness. There is no right CVA tenderness or left CVA tenderness.  Musculoskeletal:     Right lower leg: No edema.     Left lower leg: No edema.  Skin:    General: Skin is warm and dry.  Neurological:     Mental Status: He is alert.  Psychiatric:        Mood and Affect: Mood normal.    ED Results / Procedures / Treatments   Labs (all labs ordered are listed, but only abnormal results are displayed) Labs Reviewed  RESP PANEL BY RT-PCR (FLU A&B, COVID) ARPGX2 - Abnormal; Notable for the following components:      Result Value   Influenza A by PCR POSITIVE (*)    All other components within normal limits    EKG None  Radiology No results found.  Procedures Procedures    Medications Ordered in ED Medications - No data to display  ED Course/ Medical Decision Making/ A&P                           Medical Decision Making  This patient presents to the ED for  concern of URI-like symptoms, this involves an extensive number of treatment options, and is a complaint that carries with it a high risk of complications and morbidity.  The differential diagnosis includes COVID, influenza, pneumonia    Additional history obtained:  Additional history obtained from electronic medical record  Co morbidities that complicate the patient evaluation  Obesity, MI  Social Determinants of Health:  N/A  Lab Tests:  I Ordered, and personally interpreted labs.  The pertinent results include: Respiratory panel positive for influenza     Test Considered:  Chest x-ray but will defer as lung sounds are clear bilaterally, very low suspicion for pneumonia at this time as to be atypical for pneumonia developed  less than 48 hours    Rule out Low suspicion for systemic infection as patient is nontoxic-appearing, vital signs reassuring, no obvious source infection noted on exam.  Low suspicion for PE as patient is nontachypneic, nonhypoxic, he is currently on anticoagulant making PE unlikely at this time, presentation more consistent with a viral URI.  Low suspicion for strep throat as oropharynx was visualized, no erythema or exudates noted.  Low suspicion patient would need  hospitalized due to viral infection or Covid as vital signs reassuring, patient is not in respiratory distress.      Dispostion and problem list  After consideration of the diagnostic results and the patients response to treatment, I feel that the patent would benefit from   1.  Influenza-will recommend symptom management, due to patient's comorbidities i.e. MIs DVTs we will start him on Tamiflu, and follow-up with his PCP for further evaluation.  Given strict return precautions.            Final Clinical Impression(s) / ED Diagnoses Final diagnoses:  Influenza    Rx / DC Orders ED Discharge Orders          Ordered    oseltamivir (TAMIFLU) 75 MG capsule  Every 12  hours        08/03/21 1133              Aron Baba 08/03/21 1133    Davonna Belling, MD 08/04/21 [phone]

## 2021-08-03 NOTE — ED Triage Notes (Signed)
Pt presents to ED with complaints of cough, headache, chills, runny nose since yesterday.

## 2021-08-04 ENCOUNTER — Telehealth: Payer: Self-pay

## 2021-08-04 NOTE — Telephone Encounter (Signed)
Transition Care Management Unsuccessful Follow-up Telephone Call  Date of discharge and from where:  08/03/2021 from [facility]  Attempts:  1st Attempt  Reason for unsuccessful TCM follow-up call:  Left voice message

## 2021-08-05 NOTE — Telephone Encounter (Signed)
Transition Care Management Unsuccessful Follow-up Telephone Call  Date of discharge and from where:  08/03/2021-[place]   Attempts:  2nd Attempt  Reason for unsuccessful TCM follow-up call:  Left voice message

## 2021-08-06 ENCOUNTER — Emergency Department (HOSPITAL_COMMUNITY)
Admission: EM | Admit: 2021-08-06 | Discharge: 2021-08-06 | Disposition: A | Discharge status: Home / Self Care | Payer: Medicaid Other | Attending: Emergency Medicine | Admitting: Emergency Medicine

## 2021-08-06 ENCOUNTER — Encounter (HOSPITAL_COMMUNITY): Payer: Self-pay

## 2021-08-06 ENCOUNTER — Other Ambulatory Visit: Payer: Self-pay

## 2021-08-06 ENCOUNTER — Emergency Department (HOSPITAL_COMMUNITY): Payer: Medicaid Other

## 2021-08-06 DIAGNOSIS — R0602 Shortness of breath: Secondary | ICD-10-CM | POA: Diagnosis present

## 2021-08-06 DIAGNOSIS — I2510 Atherosclerotic heart disease of native coronary artery without angina pectoris: Secondary | ICD-10-CM | POA: Insufficient documentation

## 2021-08-06 DIAGNOSIS — R059 Cough, unspecified: Secondary | ICD-10-CM | POA: Diagnosis not present

## 2021-08-06 DIAGNOSIS — Z7901 Long term (current) use of anticoagulants: Secondary | ICD-10-CM | POA: Diagnosis not present

## 2021-08-06 DIAGNOSIS — J111 Influenza due to unidentified influenza virus with other respiratory manifestations: Secondary | ICD-10-CM

## 2021-08-06 DIAGNOSIS — R079 Chest pain, unspecified: Secondary | ICD-10-CM | POA: Diagnosis not present

## 2021-08-06 DIAGNOSIS — Z79899 Other long term (current) drug therapy: Secondary | ICD-10-CM | POA: Diagnosis not present

## 2021-08-06 DIAGNOSIS — I10 Essential (primary) hypertension: Secondary | ICD-10-CM | POA: Insufficient documentation

## 2021-08-06 DIAGNOSIS — R051 Acute cough: Secondary | ICD-10-CM

## 2021-08-06 DIAGNOSIS — R0789 Other chest pain: Secondary | ICD-10-CM | POA: Diagnosis not present

## 2021-08-06 DIAGNOSIS — J101 Influenza due to other identified influenza virus with other respiratory manifestations: Secondary | ICD-10-CM | POA: Diagnosis not present

## 2021-08-06 LAB — CBC WITH DIFFERENTIAL/PLATELET
Abs Immature Granulocytes: 0.02 10*3/uL (ref 0.00–0.07)
Basophils Absolute: 0 10*3/uL (ref 0.0–0.1)
Basophils Relative: 1 %
Eosinophils Absolute: 0 10*3/uL (ref 0.0–0.5)
Eosinophils Relative: 0 %
HCT: 48.7 % (ref 39.0–52.0)
Hemoglobin: 16.5 g/dL (ref 13.0–17.0)
Immature Granulocytes: 0 %
Lymphocytes Relative: 21 %
Lymphs Abs: 1 10*3/uL (ref 0.7–4.0)
MCH: 29.7 pg (ref 26.0–34.0)
MCHC: 33.9 g/dL (ref 30.0–36.0)
MCV: 87.7 fL (ref 80.0–100.0)
Monocytes Absolute: 0.9 10*3/uL (ref 0.1–1.0)
Monocytes Relative: 19 %
Neutro Abs: 2.8 10*3/uL (ref 1.7–7.7)
Neutrophils Relative %: 59 %
Platelets: 223 10*3/uL (ref 150–400)
RBC: 5.55 MIL/uL (ref 4.22–5.81)
RDW: 12.7 % (ref 11.5–15.5)
WBC: 4.7 10*3/uL (ref 4.0–10.5)
nRBC: 0 % (ref 0.0–0.2)

## 2021-08-06 LAB — BASIC METABOLIC PANEL
Anion gap: 11 (ref 5–15)
BUN: 17 mg/dL (ref 8–23)
CO2: 23 mmol/L (ref 22–32)
Calcium: 8.2 mg/dL — ABNORMAL LOW (ref 8.9–10.3)
Chloride: 100 mmol/L (ref 98–111)
Creatinine, Ser: 0.93 mg/dL (ref 0.61–1.24)
GFR, Estimated: >60 mL/min (ref >60)
Glucose, Bld: 102 mg/dL — ABNORMAL HIGH (ref 70–99)
Potassium: 3.6 mmol/L (ref 3.5–5.1)
Sodium: 134 mmol/L — ABNORMAL LOW (ref 135–145)

## 2021-08-06 LAB — TROPONIN I (HIGH SENSITIVITY)
Troponin I (High Sensitivity): 16 ng/L (ref <18)
Troponin I (High Sensitivity): 14 ng/L (ref <18)

## 2021-08-06 MED ORDER — BENZONATATE 100 MG PO CAPS
200.0000 mg | ORAL_CAPSULE | Freq: Once | ORAL | Status: AC
  Administered 2021-08-06: 200 mg via ORAL
  Filled 2021-08-06: qty 2

## 2021-08-06 MED ORDER — ALBUTEROL SULFATE HFA 108 (90 BASE) MCG/ACT IN AERS
2.0000 | INHALATION_SPRAY | Freq: Once | RESPIRATORY_TRACT | Status: AC
  Administered 2021-08-06: 2 via RESPIRATORY_TRACT
  Filled 2021-08-06: qty 6.7

## 2021-08-06 MED ORDER — PROMETHAZINE-DM 6.25-15 MG/5ML PO SYRP: 5.0000 mL | ORAL_SOLUTION | Freq: Four times a day (QID) | ORAL | 0 refills | Status: DC | PRN

## 2021-08-06 NOTE — Discharge Instructions (Signed)
Use the albuterol inhaler, 1 to 2 puffs every 4-6 hours as needed.  Continue taking your Tamiflu as directed till its finished.  I have prescribed a cough medication for you as well.  Drink plenty of fluids.  Tylenol if needed for fever.  Follow-up with your primary care provider for recheck return to emergency department for any new or worsening symptoms.

## 2021-08-06 NOTE — ED Triage Notes (Signed)
Patient complaining of worsening cough, was seen and treated here on Monday and was positive for flu.

## 2021-08-06 NOTE — ED Notes (Signed)
Patient 95% ambulating on RA.

## 2021-08-06 NOTE — Telephone Encounter (Signed)
Transition Care Management Unsuccessful Follow-up Telephone Call  Date of discharge and from where:  08/03/2021 from [facility]  Attempts:  3rd Attempt  Reason for unsuccessful TCM follow-up call:  Unable to reach patient

## 2021-08-06 NOTE — ED Provider Notes (Signed)
[facility] Provider Note   CSN: [id number] Arrival date & time: 08/06/21  8921     History  Chief Complaint  Patient presents with   Cough    Clayton Pena is a 61 y.o. male.   Cough Associated symptoms: chest pain, myalgias, rhinorrhea and shortness of breath   Associated symptoms: no chills, no fever, no headaches, no rash and no wheezing        Clayton Pena is a 61 y.o. male with past medical history of coronary artery disease, hypertension, and obesity.  He presents to the Emergency Department complaining of chest pain upon waking this morning at 3:30 AM.  He was here 3 days ago diagnosed with influenza.  He has been taking Flonase, and Tamiflu without relief.  Had fever at onset of symptoms, but states fever has now resolved.  He continues to have some mild body aches.  Endorses having persistent reoccurring episodes of coughing.  He noticed pain to his chest this morning that felt slightly similar to his chest pain associated with his prior MI pain lasted approximately 15 minutes then spontaneously resolved.  No chest pain currently.  Cough is described as productive of sputum.  Continues to have some mild rhinorrhea as well.  No increased shortness of breath from baseline.    Home Medications Prior to Admission medications   Medication Sig Start Date End Date Taking? Authorizing Provider  acetaminophen-codeine (TYLENOL #3) 300-30 MG tablet Take 1 tablet by mouth every 6 (six) hours as needed for moderate pain. 05/28/21   Dorena Dew, FNP  albuterol (VENTOLIN HFA) 108 (90 Base) MCG/ACT inhaler Inhale 2 puffs into the lungs every 6 (six) hours as needed for wheezing or shortness of breath. 05/07/19   Dorena Dew, FNP  benzonatate (TESSALON) 100 MG capsule Take 1 capsule (100 mg total) by mouth 2 (two) times daily as needed for cough. 03/16/21   Fenton Foy, NP  cetirizine (ZYRTEC) 10 MG tablet Take 1 tablet (10 mg total) by mouth at bedtime.  12/08/18   Lanae Boast, FNP  colchicine 0.6 MG tablet Take 1 tablet (0.6 mg total) by mouth every 6 (six) hours as needed. As needed for Gouty Episodes. Patient not taking: Reported on 11/06/2019 06/25/19   Azzie Glatter, FNP  Elastic Bandages & Supports ([place]) [place] 1 each by Does not apply route daily. 12/07/16   Dorena Dew, FNP  ELIQUIS 5 MG TABS tablet Take 1 tablet by mouth twice daily 04/07/21   Dorena Dew, FNP  ferrous sulfate 325 (65 FE) MG tablet Take 1 tablet (325 mg total) by mouth daily with breakfast. Patient not taking: Reported on 11/06/2019 04/17/19   Dorena Dew, FNP  furosemide (LASIX) 20 MG tablet Take 1 tablet by mouth once daily 04/07/21   Dorena Dew, FNP  hydrochlorothiazide (HYDRODIURIL) 25 MG tablet Take 1 tablet by mouth once daily 04/07/21   Dorena Dew, FNP  ibuprofen (ADVIL) 200 MG tablet Take 600 mg by mouth every 6 (six) hours as needed for moderate pain.    [provider]  ondansetron (ZOFRAN) 4 MG tablet Take 1 tablet (4 mg total) by mouth daily as needed for nausea or vomiting. 08/13/20 08/13/21  Dorena Dew, FNP  oseltamivir (TAMIFLU) 75 MG capsule Take 1 capsule (75 mg total) by mouth every 12 (twelve) hours. 08/03/21   Marcello Fennel, PA-C  predniSONE (DELTASONE) 10 MG tablet Take 4 tabs for 2  days, then 3 tabs for 2 days, then 2 tabs for 2 days, then 1 tab for 2 days, then stop 03/16/21   Fenton Foy, NP  sodium chloride (OCEAN) 0.65 % SOLN nasal spray Place 1 spray into both nostrils as needed for congestion.    [provider]  zinc gluconate 50 MG tablet Take 1 tablet (50 mg total) by mouth daily. 02/27/18   Lanae Boast, FNP      Allergies    Penicillins, Demerol  [meperidine hcl], and Demerol [meperidine]    Review of Systems   Review of Systems  Constitutional:  Negative for chills and fever.  HENT:  Positive for rhinorrhea.   Respiratory:  Positive for cough and  shortness of breath. Negative for wheezing.   Cardiovascular:  Positive for chest pain. Negative for leg swelling.  Gastrointestinal:  Negative for abdominal pain, nausea and vomiting.  Genitourinary:  Negative for difficulty urinating and dysuria.  Musculoskeletal:  Positive for myalgias.  Skin:  Negative for rash.  Neurological:  Negative for dizziness, weakness, numbness and headaches.  Psychiatric/Behavioral:  Negative for confusion.   All other systems reviewed and are negative.  Physical Exam Updated Vital Signs BP (!) 153/95 (BP Location: Right Arm)    Pulse 90    Temp 98.4 F (36.9 C) (Oral)    Resp 20    Ht 6' (1.829 m)    Wt (!) 145.2 kg    SpO2 96%    BMI 43.40 kg/m  Physical Exam Vitals and nursing note reviewed.  Constitutional:      General: He is not in acute distress.    Appearance: Normal appearance. He is obese. He is not ill-appearing or toxic-appearing.  HENT:     Nose: No congestion.  Eyes:     Conjunctiva/sclera: Conjunctivae normal.  Cardiovascular:     Rate and Rhythm: Normal rate.     Pulses: Normal pulses.  Pulmonary:     Effort: Pulmonary effort is normal. No respiratory distress.     Breath sounds: Normal breath sounds. No wheezing.  Chest:     Chest wall: No tenderness.  Musculoskeletal:        General: Normal range of motion.  Skin:    General: Skin is warm.     Capillary Refill: Capillary refill takes less than 2 seconds.     Findings: No rash.  Neurological:     General: No focal deficit present.     Mental Status: He is alert.     Sensory: No sensory deficit.     Motor: No weakness.    ED Results / Procedures / Treatments   Labs (all labs ordered are listed, but only abnormal results are displayed) Labs Reviewed  BASIC METABOLIC PANEL - Abnormal; Notable for the following components:      Result Value   Sodium 134 (*)    Glucose, Bld 102 (*)    Calcium 8.2 (*)    All other components within normal limits  CBC WITH  DIFFERENTIAL/PLATELET  TROPONIN I (HIGH SENSITIVITY)  TROPONIN I (HIGH SENSITIVITY)    EKG EKG Interpretation  Date/Time:  Thursday August 06 2021 09:51:22 EST Ventricular Rate:  80 PR Interval:  161 QRS Duration: 90 QT Interval:  384 QTC Calculation: 443 R Axis:   53 Text Interpretation: Sinus rhythm Low voltage, precordial leads Minimal ST elevation, inferior leads Baseline wander in lead(s) II III aVF V2 No significant change since last tracing Confirmed by Isla Pence [phone]) on 08/06/2021 9:56:29  AM  Radiology DG Chest Portable 1 View  Result Date: 08/06/2021 CLINICAL DATA:  chest pain, cough, +flu 3 days ago EXAM: PORTABLE CHEST 1 VIEW COMPARISON:  Radiograph 04/28/2019 FINDINGS: Unchanged cardiomediastinal silhouette. There is no focal airspace consolidation. There is no large pleural effusion. There is no visible pneumothorax. There is no acute osseous abnormality. Bilateral shoulder degenerative changes. IMPRESSION: No evidence of acute cardiopulmonary disease. Electronically Signed   By: Maurine Simmering M.D.   On: 08/06/2021 09:48    Procedures Procedures    Medications Ordered in ED Medications  benzonatate (TESSALON) capsule 200 mg (has no administration in time range)    ED Course/ Medical Decision Making/ A&P                           Medical Decision Making  This patient presents to the ED for concern of cough and chest pain, this involves an extensive number of treatment options, and is a complaint that carries with it a high risk of complications and morbidity.  The differential diagnosis includes MI, ACS, pneumonia, persistent viral symptoms   Co morbidities that complicate the patient evaluation  MI, obesity   Additional history obtained:  Additional history obtained from medical records from previous ER visit.  Patient influenza positive from 08/03/2021    Lab Tests:  I Ordered, and personally interpreted labs.  The pertinent results include: No  leukocytosis, delta troponin reassuring.  No significant electrolyte abnormality.   Imaging Studies ordered:  I ordered imaging studies including chest x-ray  I independently visualized and interpreted imaging which showed without evidence of pneumonia I agree with the radiologist interpretation   Cardiac Monitoring:  The patient was maintained on a cardiac monitor.  I personally viewed and interpreted the cardiac monitored which showed an underlying rhythm of: Sinus rhythm      Reevaluation of the patient he is resting comfortably.  Cough is improved after giving Tessalon [place].  He is ambulated in the department without hypoxia maintains O2 sat of 95% on room air no respiratory distress noted     Dispostion:  After consideration of the diagnostic results and the patients response to treatment, I feel that he is appropriate for discharge home at this time.  He has PCP for follow-up.  Albuterol inhaler dispensed for home use, will provide antitussive.  He is currently taking Tamiflu prescribed from previous visit.  Patient agreeable to plan.  Return precautions discussed.          Final Clinical Impression(s) / ED Diagnoses Final diagnoses:  Acute cough  Influenza    Rx / DC Orders ED Discharge Orders     None         Kem Parkinson, PA-C 08/06/21 1255    Isla Pence, MD 08/06/21 1355

## 2021-08-07 ENCOUNTER — Telehealth: Payer: Self-pay

## 2021-08-07 NOTE — Telephone Encounter (Signed)
Transition Care Management Unsuccessful Follow-up Telephone Call  Date of discharge and from where:  08/06/2021-[place]   Attempts:  1st Attempt  Reason for unsuccessful TCM follow-up call:  Left voice message

## 2021-08-08 NOTE — Telephone Encounter (Signed)
Transition Care Management Unsuccessful Follow-up Telephone Call  Date of discharge and from where:  08/06/2021-[place]   Attempts:  2nd Attempt  Reason for unsuccessful TCM follow-up call:  Left voice message

## 2021-08-09 NOTE — Telephone Encounter (Signed)
Transition Care Management Unsuccessful Follow-up Telephone Call  Date of discharge and from where:  08/06/2021-[place]   Attempts:  3rd Attempt  Reason for unsuccessful TCM follow-up call:  Left voice message

## 2021-10-18 ENCOUNTER — Other Ambulatory Visit: Payer: Self-pay | Admitting: Family Medicine

## 2021-10-18 DIAGNOSIS — R600 Localized edema: Secondary | ICD-10-CM

## 2021-10-18 DIAGNOSIS — I10 Essential (primary) hypertension: Secondary | ICD-10-CM

## 2021-10-18 DIAGNOSIS — Z7901 Long term (current) use of anticoagulants: Secondary | ICD-10-CM

## 2021-10-18 DIAGNOSIS — Z86718 Personal history of other venous thrombosis and embolism: Secondary | ICD-10-CM

## 2021-11-13 ENCOUNTER — Ambulatory Visit (INDEPENDENT_AMBULATORY_CARE_PROVIDER_SITE_OTHER): Payer: Medicaid Other | Admitting: Nurse Practitioner

## 2021-11-13 ENCOUNTER — Other Ambulatory Visit: Payer: Self-pay

## 2021-11-13 ENCOUNTER — Encounter: Payer: Self-pay | Admitting: Nurse Practitioner

## 2021-11-13 VITALS — BP 128/76 | HR 70 | Temp 97.9°F | Ht 73.0 in | Wt 340.2 lb

## 2021-11-13 DIAGNOSIS — I10 Essential (primary) hypertension: Secondary | ICD-10-CM

## 2021-11-13 DIAGNOSIS — Z86718 Personal history of other venous thrombosis and embolism: Secondary | ICD-10-CM | POA: Diagnosis not present

## 2021-11-13 DIAGNOSIS — Z0000 Encounter for general adult medical examination without abnormal findings: Secondary | ICD-10-CM | POA: Diagnosis not present

## 2021-11-13 DIAGNOSIS — R600 Localized edema: Secondary | ICD-10-CM

## 2021-11-13 DIAGNOSIS — Z7901 Long term (current) use of anticoagulants: Secondary | ICD-10-CM

## 2021-11-13 DIAGNOSIS — R238 Other skin changes: Secondary | ICD-10-CM

## 2021-11-13 LAB — POCT URINALYSIS DIP (CLINITEK)
Bilirubin, UA: NEGATIVE
Blood, UA: NEGATIVE
Glucose, UA: NEGATIVE mg/dL
Ketones, POC UA: NEGATIVE mg/dL
Leukocytes, UA: NEGATIVE
Nitrite, UA: NEGATIVE
POC PROTEIN,UA: NEGATIVE
Spec Grav, UA: >=1.03 — AB (ref 1.010–1.025)
Urobilinogen, UA: 0.2 E.U./dL
pH, UA: 5.5 (ref 5.0–8.0)

## 2021-11-13 LAB — POCT GLYCOSYLATED HEMOGLOBIN (HGB A1C)
HbA1c POC (<> result, manual entry): 5.9 % (ref 4.0–5.6)
HbA1c, POC (controlled diabetic range): 5.9 % (ref 0.0–7.0)
HbA1c, POC (prediabetic range): 5.9 % (ref 5.7–6.4)
Hemoglobin A1C: 5.9 % — AB (ref 4.0–5.6)

## 2021-11-13 LAB — GLUCOSE, POCT (MANUAL RESULT ENTRY): POC Glucose: 109 mg/dl — AB (ref 70–99)

## 2021-11-13 MED ORDER — HYDROCHLOROTHIAZIDE 25 MG PO TABS: 25.0000 mg | ORAL_TABLET | Freq: Every day | ORAL | 2 refills | Status: DC

## 2021-11-13 MED ORDER — APIXABAN 5 MG PO TABS: 5.0000 mg | ORAL_TABLET | Freq: Two times a day (BID) | ORAL | 0 refills | Status: DC

## 2021-11-13 MED ORDER — ACETAMINOPHEN-CODEINE #3 300-30 MG PO TABS: 1.0000 | ORAL_TABLET | Freq: Four times a day (QID) | ORAL | 0 refills | Status: DC | PRN

## 2021-11-13 MED ORDER — IRON 325 (65 FE) MG PO TABS: 1.0000 | ORAL_TABLET | Freq: Every day | ORAL | 0 refills | Status: DC

## 2021-11-13 MED ORDER — FUROSEMIDE 20 MG PO TABS: 20.0000 mg | ORAL_TABLET | Freq: Every day | ORAL | 2 refills | Status: DC

## 2021-11-13 NOTE — Assessment & Plan Note (Signed)
-   HgB A1c ?- Glucose (CBG) ?- POCT URINALYSIS DIP (CLINITEK) ?- CBC ?- Comprehensive metabolic panel ?- TSH ?- Lipid Panel ? ?2. Chronic anticoagulation ? ?- apixaban (ELIQUIS) 5 MG TABS tablet; Take 1 tablet (5 mg total) by mouth 2 (two) times daily.  Dispense: 180 tablet; Refill: 0 ? ?3. History of DVT (deep vein thrombosis) ? ?- apixaban (ELIQUIS) 5 MG TABS tablet; Take 1 tablet (5 mg total) by mouth 2 (two) times daily.  Dispense: 180 tablet; Refill: 0 ? ?4. Localized edema ? ?- furosemide (LASIX) 20 MG tablet; Take 1 tablet (20 mg total) by mouth daily.  Dispense: 30 tablet; Refill: 2 ? ?5. Essential hypertension ? ?- hydrochlorothiazide (HYDRODIURIL) 25 MG tablet; Take 1 tablet (25 mg total) by mouth daily.  Dispense: 30 tablet; Refill: 2 ?- CBC ?- Comprehensive metabolic panel ?- TSH ?- Lipid Panel ? ?Follow up: ? ?Follow up in 6 months ?

## 2021-11-13 NOTE — Progress Notes (Signed)
$'@Patient'T$  ID: Clayton Pena, male    DOB: 1961-04-15, 61 y.o.   MRN: [id number] ? ?Chief Complaint  ?Patient presents with  ? Follow-up  ?  Patient is here today to get back established with a provider.   ? ? ?Referring provider: ?Dorena Dew, FNP ? ? ?HPI ? ?Patient presents today for routine follow-up visit.  Patient states that overall he has been doing well.  He is trying to follow a low salt diet.  Patient states that his blood sugars at home have been running around 100-110.  Patient did not take medications today.  He does states that he is compliant with medications but thought that he was not supposed to take them before coming to the office visit. Denies f/c/s, n/v/d, hemoptysis, PND, leg swelling ?Denies chest pain or edema. ? ? ? ? ?Allergies  ?Allergen Reactions  ? Penicillins Anaphylaxis  ?  Has patient had a PCN reaction causing immediate rash, facial/tongue/throat swelling, SOB or lightheadedness with hypotension: unknown ?Has patient had a PCN reaction causing severe rash involving mucus membranes or skin necrosis: unknown ?Has patient had a PCN reaction that required hospitalization: unknown ?Has patient had a PCN reaction occurring within the last 10 years: no ?If all of the above answers are "NO", then may proceed with Cephalosporin use. ?  ? Demerol  [Meperidine Hcl] Other (See Comments)  ?  REACTION:  Increases blood pressure  ? Demerol [Meperidine] Other (See Comments)  ?  REACTION:  Increases blood pressure  ? ? ?Immunization History  ?Administered Date(s) Administered  ? Influenza,inj,Quad PF,6+ Mos 06/15/2016, 05/25/2017, 05/16/2019  ? Tdap 02/22/2018  ? ? ?Past Medical History:  ?Diagnosis Date  ? Acute suppr otitis media w/o spon rupt ear drum, right ear 05/2019  ? Angina at rest [facility])   ? Coronary artery disease   ? ? ?Tobacco History: ?Social History  ? ?Tobacco Use  ?Smoking Status Never  ?Smokeless Tobacco Never  ? ?Counseling given: Not Answered ? ? ?Outpatient Encounter  Medications as of 11/13/2021  ?Medication Sig  ? albuterol (VENTOLIN HFA) 108 (90 Base) MCG/ACT inhaler Inhale 2 puffs into the lungs every 6 (six) hours as needed for wheezing or shortness of breath.  ? ibuprofen (ADVIL) 200 MG tablet Take 600 mg by mouth every 6 (six) hours as needed for moderate pain.  ? zinc gluconate 50 MG tablet Take 1 tablet (50 mg total) by mouth daily.  ? [DISCONTINUED] acetaminophen-codeine (TYLENOL #3) 300-30 MG tablet Take 1 tablet by mouth every 6 (six) hours as needed for moderate pain.  ? [DISCONTINUED] ELIQUIS 5 MG TABS tablet Take 1 tablet by mouth twice daily  ? [DISCONTINUED] Ferrous Sulfate (IRON) 325 (65 Fe) MG TABS Take 1 tablet by mouth once daily with breakfast  ? [DISCONTINUED] furosemide (LASIX) 20 MG tablet Take 1 tablet by mouth once daily  ? [DISCONTINUED] hydrochlorothiazide (HYDRODIURIL) 25 MG tablet Take 1 tablet by mouth once daily  ? apixaban (ELIQUIS) 5 MG TABS tablet Take 1 tablet (5 mg total) by mouth 2 (two) times daily.  ? benzonatate (TESSALON) 100 MG capsule Take 1 capsule (100 mg total) by mouth 2 (two) times daily as needed for cough. (Patient not taking: Reported on 11/13/2021)  ? cetirizine (ZYRTEC) 10 MG tablet Take 1 tablet (10 mg total) by mouth at bedtime. (Patient not taking: Reported on 11/13/2021)  ? colchicine 0.6 MG tablet Take 1 tablet (0.6 mg total) by mouth every 6 (six) hours as needed. As needed for  Gouty Episodes. (Patient not taking: Reported on 11/06/2019)  ? Elastic Bandages & Supports (MEDICAL COMPRESSION STOCKINGS) MISC 1 each by Does not apply route daily. (Patient not taking: Reported on 11/13/2021)  ? Ferrous Sulfate (IRON) 325 (65 Fe) MG TABS Take 1 tablet (325 mg total) by mouth daily with breakfast.  ? furosemide (LASIX) 20 MG tablet Take 1 tablet (20 mg total) by mouth daily.  ? hydrochlorothiazide (HYDRODIURIL) 25 MG tablet Take 1 tablet (25 mg total) by mouth daily.  ? oseltamivir (TAMIFLU) 75 MG capsule Take 1 capsule (75 mg  total) by mouth every 12 (twelve) hours. (Patient not taking: Reported on 11/13/2021)  ? predniSONE (DELTASONE) 10 MG tablet Take 4 tabs for 2 days, then 3 tabs for 2 days, then 2 tabs for 2 days, then 1 tab for 2 days, then stop (Patient not taking: Reported on 11/13/2021)  ? promethazine-dextromethorphan (PROMETHAZINE-DM) 6.25-15 MG/5ML syrup Take 5 mLs by mouth 4 (four) times daily as needed. May cause drowsiness (Patient not taking: Reported on 11/13/2021)  ? sodium chloride (OCEAN) 0.65 % SOLN nasal spray Place 1 spray into both nostrils as needed for congestion. (Patient not taking: Reported on 11/13/2021)  ? ?No facility-administered encounter medications on file as of 11/13/2021.  ? ? ? ?Review of Systems ? ?Review of Systems  ?Constitutional: Negative.   ?HENT: Negative.    ?Cardiovascular: Negative.   ?Gastrointestinal: Negative.   ?Allergic/Immunologic: Negative.   ?Neurological: Negative.   ?Psychiatric/Behavioral: Negative.     ? ? ? ?Physical Exam ? ?BP 128/76 (BP Location: Left Arm, Cuff Size: Large)   Pulse 70   Temp 97.9 ?F (36.6 ?C)   Ht '6\' 1"'$  (1.854 m)   Wt (!) 340 lb 3.2 oz (154.3 kg)   SpO2 99%   BMI 44.88 kg/m?  ? ?Wt Readings from Last 5 Encounters:  ?11/13/21 (!) 340 lb 3.2 oz (154.3 kg)  ?08/06/21 (!) 320 lb (145.2 kg)  ?08/03/21 (!) 330 lb (149.7 kg)  ?12/18/19 239 lb 4 oz (108.5 kg)  ?11/06/19 (!) 333 lb (151 kg)  ? ? ? ?Physical Exam ?Vitals and nursing note reviewed.  ?Constitutional:   ?   General: He is not in acute distress. ?   Appearance: He is well-developed.  ?Cardiovascular:  ?   Rate and Rhythm: Normal rate and regular rhythm.  ?Pulmonary:  ?   Effort: Pulmonary effort is normal.  ?   Breath sounds: Normal breath sounds.  ?Skin: ?   General: Skin is warm and dry.  ?Neurological:  ?   Mental Status: He is alert and oriented to person, place, and time.  ? ? ? ?Lab Results: ? ?CBC ?   ?Component Value Date/Time  ? WBC 4.7 08/06/2021 0940  ? RBC 5.55 08/06/2021 0940  ? HGB 16.5  08/06/2021 0940  ? HGB 16.5 11/06/2019 1159  ? HCT 48.7 08/06/2021 0940  ? HCT 49.9 11/06/2019 1159  ? PLT 223 08/06/2021 0940  ? PLT 215 11/06/2019 1159  ? MCV 87.7 08/06/2021 0940  ? MCV 90 11/06/2019 1159  ? MCH 29.7 08/06/2021 0940  ? MCHC 33.9 08/06/2021 0940  ? RDW 12.7 08/06/2021 0940  ? RDW 12.6 11/06/2019 1159  ? LYMPHSABS 1.0 08/06/2021 0940  ? LYMPHSABS 2.0 11/06/2019 1159  ? MONOABS 0.9 08/06/2021 0940  ? EOSABS 0.0 08/06/2021 0940  ? EOSABS 0.0 11/06/2019 1159  ? BASOSABS 0.0 08/06/2021 0940  ? BASOSABS 0.1 11/06/2019 1159  ? ? ?BMET ?   ?Component Value  Date/Time  ? NA 134 (L) 08/06/2021 0940  ? NA 142 12/18/2019 0918  ? K 3.6 08/06/2021 0940  ? CL 100 08/06/2021 0940  ? CO2 23 08/06/2021 0940  ? GLUCOSE 102 (H) 08/06/2021 0940  ? BUN 17 08/06/2021 0940  ? BUN 19 12/18/2019 0918  ? CREATININE 0.93 08/06/2021 0940  ? CREATININE 0.88 06/24/2017 0809  ? CALCIUM 8.2 (L) 08/06/2021 0940  ? GFRNONAA >60 08/06/2021 0940  ? Maurice 95 06/24/2017 0809  ? GFRAA 90 12/18/2019 0918  ? GFRAA 110 06/24/2017 0809  ? ? ?BNP ?   ?Component Value Date/Time  ? BNP 44.2 11/06/2019 1159  ? BNP 43.6 06/13/2016 2136  ? ? ?ProBNP ?   ?Component Value Date/Time  ? PROBNP 31.0 11/07/2008 1242  ? ? ?Imaging: ?No results found. ? ? ?Assessment & Plan:  ? ?Healthcare maintenance ?- HgB A1c ?- Glucose (CBG) ?- POCT URINALYSIS DIP (CLINITEK) ?- CBC ?- Comprehensive metabolic panel ?- TSH ?- Lipid Panel ? ?2. Chronic anticoagulation ? ?- apixaban (ELIQUIS) 5 MG TABS tablet; Take 1 tablet (5 mg total) by mouth 2 (two) times daily.  Dispense: 180 tablet; Refill: 0 ? ?3. History of DVT (deep vein thrombosis) ? ?- apixaban (ELIQUIS) 5 MG TABS tablet; Take 1 tablet (5 mg total) by mouth 2 (two) times daily.  Dispense: 180 tablet; Refill: 0 ? ?4. Localized edema ? ?- furosemide (LASIX) 20 MG tablet; Take 1 tablet (20 mg total) by mouth daily.  Dispense: 30 tablet; Refill: 2 ? ?5. Essential hypertension ? ?- hydrochlorothiazide  (HYDRODIURIL) 25 MG tablet; Take 1 tablet (25 mg total) by mouth daily.  Dispense: 30 tablet; Refill: 2 ?- CBC ?- Comprehensive metabolic panel ?- TSH ?- Lipid Panel ? ?Follow up: ? ?Follow up in 6 months ? ? ? ? ?Fenton Foy, NP ?

## 2021-11-13 NOTE — Patient Instructions (Signed)
1. Healthcare maintenance ? ?- HgB A1c ?- Glucose (CBG) ?- POCT URINALYSIS DIP (CLINITEK) ?- CBC ?- Comprehensive metabolic panel ?- TSH ?- Lipid Panel ? ?2. Chronic anticoagulation ? ?- apixaban (ELIQUIS) 5 MG TABS tablet; Take 1 tablet (5 mg total) by mouth 2 (two) times daily.  Dispense: 180 tablet; Refill: 0 ? ?3. History of DVT (deep vein thrombosis) ? ?- apixaban (ELIQUIS) 5 MG TABS tablet; Take 1 tablet (5 mg total) by mouth 2 (two) times daily.  Dispense: 180 tablet; Refill: 0 ? ?4. Localized edema ? ?- furosemide (LASIX) 20 MG tablet; Take 1 tablet (20 mg total) by mouth daily.  Dispense: 30 tablet; Refill: 2 ? ?5. Essential hypertension ? ?- hydrochlorothiazide (HYDRODIURIL) 25 MG tablet; Take 1 tablet (25 mg total) by mouth daily.  Dispense: 30 tablet; Refill: 2 ?- CBC ?- Comprehensive metabolic panel ?- TSH ?- Lipid Panel ? ?Follow up: ? ?Follow up in 6 months ? ?

## 2021-11-14 LAB — CBC
Hematocrit: 48.3 % (ref 37.5–51.0)
Hemoglobin: 15.7 g/dL (ref 13.0–17.7)
MCH: 28.5 pg (ref 26.6–33.0)
MCHC: 32.5 g/dL (ref 31.5–35.7)
MCV: 88 fL (ref 79–97)
Platelets: 277 10*3/uL (ref 150–450)
RBC: 5.51 x10E6/uL (ref 4.14–5.80)
RDW: 12.5 % (ref 11.6–15.4)
WBC: 12.9 10*3/uL — ABNORMAL HIGH (ref 3.4–10.8)

## 2021-11-14 LAB — COMPREHENSIVE METABOLIC PANEL
ALT: 45 IU/L — ABNORMAL HIGH (ref 0–44)
AST: 34 IU/L (ref 0–40)
Albumin/Globulin Ratio: 1.9 (ref 1.2–2.2)
Albumin: 4.4 g/dL (ref 3.8–4.8)
Alkaline Phosphatase: 53 IU/L (ref 44–121)
BUN/Creatinine Ratio: 16 (ref 10–24)
BUN: 13 mg/dL (ref 8–27)
Bilirubin Total: 0.5 mg/dL (ref 0.0–1.2)
CO2: 20 mmol/L (ref 20–29)
Calcium: 9.3 mg/dL (ref 8.6–10.2)
Chloride: 102 mmol/L (ref 96–106)
Creatinine, Ser: 0.8 mg/dL (ref 0.76–1.27)
Globulin, Total: 2.3 g/dL (ref 1.5–4.5)
Glucose: 98 mg/dL (ref 70–99)
Potassium: 4.5 mmol/L (ref 3.5–5.2)
Sodium: 139 mmol/L (ref 134–144)
Total Protein: 6.7 g/dL (ref 6.0–8.5)
eGFR: 100 mL/min/{1.73_m2} (ref >59)

## 2021-11-14 LAB — LIPID PANEL
Chol/HDL Ratio: 3.2 ratio (ref 0.0–5.0)
Cholesterol, Total: 133 mg/dL (ref 100–199)
HDL: 41 mg/dL (ref >39)
LDL Chol Calc (NIH): 70 mg/dL (ref 0–99)
Triglycerides: 123 mg/dL (ref 0–149)
VLDL Cholesterol Cal: 22 mg/dL (ref 5–40)

## 2021-11-14 LAB — TSH: TSH: 2.54 u[IU]/mL (ref 0.450–4.500)

## 2021-11-18 ENCOUNTER — Other Ambulatory Visit: Payer: Self-pay | Admitting: Nurse Practitioner

## 2021-11-18 DIAGNOSIS — R7989 Other specified abnormal findings of blood chemistry: Secondary | ICD-10-CM

## 2021-11-23 ENCOUNTER — Other Ambulatory Visit: Payer: Self-pay | Admitting: Nurse Practitioner

## 2021-11-23 ENCOUNTER — Other Ambulatory Visit: Payer: Self-pay

## 2021-11-23 ENCOUNTER — Other Ambulatory Visit: Payer: Medicaid Other

## 2021-11-23 DIAGNOSIS — R7989 Other specified abnormal findings of blood chemistry: Secondary | ICD-10-CM

## 2021-11-23 DIAGNOSIS — D72829 Elevated white blood cell count, unspecified: Secondary | ICD-10-CM

## 2021-11-25 LAB — SPECIMEN STATUS REPORT

## 2021-12-04 LAB — COMPREHENSIVE METABOLIC PANEL

## 2021-12-04 LAB — CBC/DIFF AMBIGUOUS DEFAULT
Basophils Absolute: 0.1 10*3/uL
Basos: 1 %
EOS (ABSOLUTE): 0 10*3/uL
Eos: 0 %
Hematocrit: 47.1 %
Hemoglobin: 15.8 g/dL
Immature Grans (Abs): 0.1 10*3/uL
Immature Granulocytes: 1 %
Lymphocytes Absolute: 1.7 10*3/uL
Lymphs: 16 %
MCH: 29.6 pg
MCHC: 33.5 g/dL
MCV: 88 fL
Monocytes Absolute: 0.8 10*3/uL
Monocytes: 7 %
Neutrophils Absolute: 7.9 10*3/uL
Neutrophils: 75 %
Platelets: 247 10*3/uL (ref 150–450)
RBC: 5.34 x10E6/uL
RDW: 12.5 %
WBC: 10.4 10*3/uL (ref 3.4–10.8)

## 2021-12-04 LAB — SPECIMEN STATUS REPORT

## 2021-12-09 NOTE — Telephone Encounter (Signed)
This referral has already been placed.  

## 2022-01-23 ENCOUNTER — Other Ambulatory Visit: Payer: Self-pay | Admitting: Nurse Practitioner

## 2022-01-23 DIAGNOSIS — Z86718 Personal history of other venous thrombosis and embolism: Secondary | ICD-10-CM

## 2022-01-23 DIAGNOSIS — Z7901 Long term (current) use of anticoagulants: Secondary | ICD-10-CM

## 2022-02-12 ENCOUNTER — Ambulatory Visit: Payer: Medicaid Other | Admitting: Nurse Practitioner

## 2022-02-12 ENCOUNTER — Encounter: Payer: Self-pay | Admitting: Nurse Practitioner

## 2022-02-12 VITALS — BP 138/82 | HR 73 | Temp 98.0°F | Ht 73.0 in | Wt 346.2 lb

## 2022-02-12 DIAGNOSIS — J309 Allergic rhinitis, unspecified: Secondary | ICD-10-CM

## 2022-02-12 DIAGNOSIS — Z7901 Long term (current) use of anticoagulants: Secondary | ICD-10-CM | POA: Diagnosis not present

## 2022-02-12 DIAGNOSIS — Z0000 Encounter for general adult medical examination without abnormal findings: Secondary | ICD-10-CM | POA: Diagnosis not present

## 2022-02-12 DIAGNOSIS — Z86718 Personal history of other venous thrombosis and embolism: Secondary | ICD-10-CM

## 2022-02-12 DIAGNOSIS — I10 Essential (primary) hypertension: Secondary | ICD-10-CM | POA: Diagnosis not present

## 2022-02-12 DIAGNOSIS — R600 Localized edema: Secondary | ICD-10-CM | POA: Diagnosis not present

## 2022-02-12 DIAGNOSIS — R7989 Other specified abnormal findings of blood chemistry: Secondary | ICD-10-CM

## 2022-02-12 LAB — POCT URINALYSIS DIP (CLINITEK)
Bilirubin, UA: NEGATIVE
Blood, UA: NEGATIVE
Glucose, UA: NEGATIVE mg/dL
Ketones, POC UA: NEGATIVE mg/dL
Leukocytes, UA: NEGATIVE
Nitrite, UA: NEGATIVE
POC PROTEIN,UA: NEGATIVE
Spec Grav, UA: 1.015 (ref 1.010–1.025)
Urobilinogen, UA: 0.2 E.U./dL
pH, UA: 5 (ref 5.0–8.0)

## 2022-02-12 MED ORDER — APIXABAN 5 MG PO TABS: 5.0000 mg | ORAL_TABLET | Freq: Two times a day (BID) | ORAL | 0 refills | Status: DC

## 2022-02-12 MED ORDER — IRON 325 (65 FE) MG PO TABS: 1.0000 | ORAL_TABLET | Freq: Every day | ORAL | 0 refills | Status: DC

## 2022-02-12 MED ORDER — HYDROCHLOROTHIAZIDE 25 MG PO TABS: 25.0000 mg | ORAL_TABLET | Freq: Every day | ORAL | 2 refills | Status: DC

## 2022-02-12 MED ORDER — FUROSEMIDE 20 MG PO TABS: 20.0000 mg | ORAL_TABLET | Freq: Every day | ORAL | 2 refills | Status: DC

## 2022-02-12 MED ORDER — CETIRIZINE HCL 10 MG PO TABS: 10.0000 mg | ORAL_TABLET | Freq: Every day | ORAL | 11 refills | Status: DC

## 2022-02-12 NOTE — Assessment & Plan Note (Signed)
-   POCT URINALYSIS DIP (CLINITEK) - CBC - Comprehensive metabolic panel  2. Localized edema  - furosemide (LASIX) 20 MG tablet; Take 1 tablet (20 mg total) by mouth daily.  Dispense: 30 tablet; Refill: 2  3. Allergic rhinitis, unspecified seasonality, unspecified trigger  - cetirizine (ZYRTEC) 10 MG tablet; Take 1 tablet (10 mg total) by mouth at bedtime.  Dispense: 30 tablet; Refill: 11  4. Essential hypertension  - hydrochlorothiazide (HYDRODIURIL) 25 MG tablet; Take 1 tablet (25 mg total) by mouth daily.  Dispense: 30 tablet; Refill: 2  5. Chronic anticoagulation  - apixaban (ELIQUIS) 5 MG TABS tablet; Take 1 tablet (5 mg total) by mouth 2 (two) times daily.  Dispense: 180 tablet; Refill: 0  6. History of DVT (deep vein thrombosis)  - apixaban (ELIQUIS) 5 MG TABS tablet; Take 1 tablet (5 mg total) by mouth 2 (two) times daily.  Dispense: 180 tablet; Refill: 0  7. Low testosterone  - Testosterone  Follow up:  Follow up in 6 months or sooner if needed

## 2022-02-12 NOTE — Progress Notes (Signed)
$'@Patient'U$  ID: Clayton Pena, male    DOB: October 03, 1960, 61 y.o.   MRN: [id number]  Chief Complaint  Patient presents with   Follow-up    Pt is here for 3 months follow up visit. Pt states he needs his Testerone checked pt is requesting refill on all medications    Referring provider: Fenton Foy, NP   HPI  [name] is a very pleasant 61 year old male with a medical history significant for essential hypertension, coronary artery disease, history of DVT, history of gout, morbid obesity with BMI of 40-40 4.9, history of fatty liver disease, and history of left adrenal adenoma    Patient presents today for follow-up visit.  Patient states that he has been feeling more fatigued.  He was referred to endocrinology at last visit and states that the endocrinologist does need a testosterone level checked.  We will check this in office today.  Overall patient's been doing well other than the fatigue.  He is due for blood work today.  Patient is compliant with medications.  And does need refills today. Denies f/c/s, n/v/d, hemoptysis, PND, leg swelling Denies chest pain or edema     Allergies  Allergen Reactions   Penicillins Anaphylaxis    Has patient had a PCN reaction causing immediate rash, facial/tongue/throat swelling, SOB or lightheadedness with hypotension: unknown Has patient had a PCN reaction causing severe rash involving mucus membranes or skin necrosis: unknown Has patient had a PCN reaction that required hospitalization: unknown Has patient had a PCN reaction occurring within the last 10 years: no If all of the above answers are "NO", then may proceed with Cephalosporin use.    Demerol  [Meperidine Hcl] Other (See Comments)    REACTION:  Increases blood pressure   Demerol [Meperidine] Other (See Comments)    REACTION:  Increases blood pressure    Immunization History  Administered Date(s) Administered   Influenza,inj,Quad PF,6+ Mos 06/15/2016, 05/25/2017, 05/16/2019    Tdap 02/22/2018    Past Medical History:  Diagnosis Date   Acute suppr otitis media w/o spon rupt ear drum, right ear 05/2019   Angina at rest [facility])    Coronary artery disease     Tobacco History: Social History   Tobacco Use  Smoking Status Never  Smokeless Tobacco Never   Counseling given: Not Answered   Outpatient Encounter Medications as of 02/12/2022  Medication Sig   acetaminophen-codeine (TYLENOL #3) 300-30 MG tablet Take 1 tablet by mouth every 6 (six) hours as needed for moderate pain.   albuterol (VENTOLIN HFA) 108 (90 Base) MCG/ACT inhaler Inhale 2 puffs into the lungs every 6 (six) hours as needed for wheezing or shortness of breath.   ibuprofen (ADVIL) 200 MG tablet Take 600 mg by mouth every 6 (six) hours as needed for moderate pain.   zinc gluconate 50 MG tablet Take 1 tablet (50 mg total) by mouth daily.   [DISCONTINUED] cetirizine (ZYRTEC) 10 MG tablet Take 1 tablet (10 mg total) by mouth at bedtime.   apixaban (ELIQUIS) 5 MG TABS tablet Take 1 tablet (5 mg total) by mouth 2 (two) times daily.   benzonatate (TESSALON) 100 MG capsule Take 1 capsule (100 mg total) by mouth 2 (two) times daily as needed for cough. (Patient not taking: Reported on 11/13/2021)   cetirizine (ZYRTEC) 10 MG tablet Take 1 tablet (10 mg total) by mouth at bedtime.   colchicine 0.6 MG tablet Take 1 tablet (0.6 mg total) by mouth every 6 (six) hours as needed.  As needed for Gouty Episodes. (Patient not taking: Reported on 11/06/2019)   Elastic Bandages & Supports (MEDICAL COMPRESSION STOCKINGS) MISC 1 each by Does not apply route daily. (Patient not taking: Reported on 02/12/2022)   Ferrous Sulfate (IRON) 325 (65 Fe) MG TABS Take 1 tablet (325 mg total) by mouth daily with breakfast.   furosemide (LASIX) 20 MG tablet Take 1 tablet (20 mg total) by mouth daily.   hydrochlorothiazide (HYDRODIURIL) 25 MG tablet Take 1 tablet (25 mg total) by mouth daily.   oseltamivir (TAMIFLU) 75 MG capsule Take 1  capsule (75 mg total) by mouth every 12 (twelve) hours. (Patient not taking: Reported on 11/13/2021)   predniSONE (DELTASONE) 10 MG tablet Take 4 tabs for 2 days, then 3 tabs for 2 days, then 2 tabs for 2 days, then 1 tab for 2 days, then stop (Patient not taking: Reported on 11/13/2021)   promethazine-dextromethorphan (PROMETHAZINE-DM) 6.25-15 MG/5ML syrup Take 5 mLs by mouth 4 (four) times daily as needed. May cause drowsiness (Patient not taking: Reported on 11/13/2021)   sodium chloride (OCEAN) 0.65 % SOLN nasal spray Place 1 spray into both nostrils as needed for congestion. (Patient not taking: Reported on 11/13/2021)   [DISCONTINUED] apixaban (ELIQUIS) 5 MG TABS tablet Take 1 tablet (5 mg total) by mouth 2 (two) times daily.   [DISCONTINUED] Ferrous Sulfate (IRON) 325 (65 Fe) MG TABS Take 1 tablet (325 mg total) by mouth daily with breakfast.   [DISCONTINUED] furosemide (LASIX) 20 MG tablet Take 1 tablet (20 mg total) by mouth daily.   [DISCONTINUED] hydrochlorothiazide (HYDRODIURIL) 25 MG tablet Take 1 tablet (25 mg total) by mouth daily.   No facility-administered encounter medications on file as of 02/12/2022.     Review of Systems  Review of Systems  Constitutional: Negative.   HENT: Negative.    Cardiovascular: Negative.   Gastrointestinal: Negative.   Allergic/Immunologic: Negative.   Neurological: Negative.   Psychiatric/Behavioral: Negative.         Physical Exam  BP 138/82 (BP Location: Right Arm, Patient Position: Sitting, Cuff Size: Large)   Pulse 73   Temp 98 F (36.7 C)   Ht '6\' 1"'$  (1.854 m)   Wt (!) 346 lb 4 oz (157.1 kg)   SpO2 99%   BMI 45.68 kg/m   Wt Readings from Last 5 Encounters:  02/12/22 (!) 346 lb 4 oz (157.1 kg)  11/13/21 (!) 340 lb 3.2 oz (154.3 kg)  08/06/21 (!) 320 lb (145.2 kg)  08/03/21 (!) 330 lb (149.7 kg)  12/18/19 239 lb 4 oz (108.5 kg)     Physical Exam Vitals and nursing note reviewed.  Constitutional:      General: He is not in  acute distress.    Appearance: He is well-developed.  Cardiovascular:     Rate and Rhythm: Normal rate and regular rhythm.  Pulmonary:     Effort: Pulmonary effort is normal.     Breath sounds: Normal breath sounds.  Skin:    General: Skin is warm and dry.  Neurological:     Mental Status: He is alert and oriented to person, place, and time.      Lab Results:  CBC    Component Value Date/Time   WBC 10.4 11/23/2021 0000   WBC 4.7 08/06/2021 0940   RBC 5.34 11/23/2021 0000   RBC 5.55 08/06/2021 0940   HGB 15.8 11/23/2021 0000   HCT 47.1 11/23/2021 0000   PLT 247 11/23/2021 0000   MCV 88 11/23/2021 0000  MCH 29.6 11/23/2021 0000   MCH 29.7 08/06/2021 0940   MCHC 33.5 11/23/2021 0000   MCHC 33.9 08/06/2021 0940   RDW 12.5 11/23/2021 0000   LYMPHSABS 1.7 11/23/2021 0000   MONOABS 0.9 08/06/2021 0940   EOSABS 0.0 11/23/2021 0000   BASOSABS 0.1 11/23/2021 0000    BMET    Component Value Date/Time   NA CANCELED 11/23/2021 0000   K CANCELED 11/23/2021 0000   CL CANCELED 11/23/2021 0000   CO2 CANCELED 11/23/2021 0000   GLUCOSE CANCELED 11/23/2021 0000   GLUCOSE 102 (H) 08/06/2021 0940   BUN CANCELED 11/23/2021 0000   CREATININE CANCELED 11/23/2021 0000   CREATININE 0.88 06/24/2017 0809   CALCIUM CANCELED 11/23/2021 0000   GFRNONAA >60 08/06/2021 0940   GFRNONAA 95 06/24/2017 0809   GFRAA 90 12/18/2019 0918   GFRAA 110 06/24/2017 0809    BNP    Component Value Date/Time   BNP 44.2 11/06/2019 1159   BNP 43.6 06/13/2016 2136    ProBNP    Component Value Date/Time   PROBNP 31.0 11/07/2008 1242    Imaging: No results found.   Assessment & Plan:   Healthcare maintenance - POCT URINALYSIS DIP (CLINITEK) - CBC - Comprehensive metabolic panel  2. Localized edema  - furosemide (LASIX) 20 MG tablet; Take 1 tablet (20 mg total) by mouth daily.  Dispense: 30 tablet; Refill: 2  3. Allergic rhinitis, unspecified seasonality, unspecified trigger  -  cetirizine (ZYRTEC) 10 MG tablet; Take 1 tablet (10 mg total) by mouth at bedtime.  Dispense: 30 tablet; Refill: 11  4. Essential hypertension  - hydrochlorothiazide (HYDRODIURIL) 25 MG tablet; Take 1 tablet (25 mg total) by mouth daily.  Dispense: 30 tablet; Refill: 2  5. Chronic anticoagulation  - apixaban (ELIQUIS) 5 MG TABS tablet; Take 1 tablet (5 mg total) by mouth 2 (two) times daily.  Dispense: 180 tablet; Refill: 0  6. History of DVT (deep vein thrombosis)  - apixaban (ELIQUIS) 5 MG TABS tablet; Take 1 tablet (5 mg total) by mouth 2 (two) times daily.  Dispense: 180 tablet; Refill: 0  7. Low testosterone  - Testosterone  Follow up:  Follow up in 6 months or sooner if needed     Fenton Foy, NP 02/12/2022

## 2022-02-12 NOTE — Patient Instructions (Signed)
1. Healthcare maintenance  - POCT URINALYSIS DIP (CLINITEK) - CBC - Comprehensive metabolic panel  2. Localized edema  - furosemide (LASIX) 20 MG tablet; Take 1 tablet (20 mg total) by mouth daily.  Dispense: 30 tablet; Refill: 2  3. Allergic rhinitis, unspecified seasonality, unspecified trigger  - cetirizine (ZYRTEC) 10 MG tablet; Take 1 tablet (10 mg total) by mouth at bedtime.  Dispense: 30 tablet; Refill: 11  4. Essential hypertension  - hydrochlorothiazide (HYDRODIURIL) 25 MG tablet; Take 1 tablet (25 mg total) by mouth daily.  Dispense: 30 tablet; Refill: 2  5. Chronic anticoagulation  - apixaban (ELIQUIS) 5 MG TABS tablet; Take 1 tablet (5 mg total) by mouth 2 (two) times daily.  Dispense: 180 tablet; Refill: 0  6. History of DVT (deep vein thrombosis)  - apixaban (ELIQUIS) 5 MG TABS tablet; Take 1 tablet (5 mg total) by mouth 2 (two) times daily.  Dispense: 180 tablet; Refill: 0  7. Low testosterone  - Testosterone  Follow up:  Follow up in 6 months or sooner if needed

## 2022-02-13 LAB — COMPREHENSIVE METABOLIC PANEL
ALT: 51 IU/L — ABNORMAL HIGH (ref 0–44)
AST: 31 IU/L (ref 0–40)
Albumin/Globulin Ratio: 1.5 (ref 1.2–2.2)
Albumin: 4 g/dL (ref 3.9–4.9)
Alkaline Phosphatase: 52 IU/L (ref 44–121)
BUN/Creatinine Ratio: 18 (ref 10–24)
BUN: 14 mg/dL (ref 8–27)
Bilirubin Total: 0.3 mg/dL (ref 0.0–1.2)
CO2: 19 mmol/L — ABNORMAL LOW (ref 20–29)
Calcium: 9 mg/dL (ref 8.6–10.2)
Chloride: 101 mmol/L (ref 96–106)
Creatinine, Ser: 0.8 mg/dL (ref 0.76–1.27)
Globulin, Total: 2.7 g/dL (ref 1.5–4.5)
Glucose: 113 mg/dL — ABNORMAL HIGH (ref 70–99)
Potassium: 4.2 mmol/L (ref 3.5–5.2)
Sodium: 137 mmol/L (ref 134–144)
Total Protein: 6.7 g/dL (ref 6.0–8.5)
eGFR: 100 mL/min/{1.73_m2} (ref >59)

## 2022-02-13 LAB — CBC
Hematocrit: 48.3 % (ref 37.5–51.0)
Hemoglobin: 15.7 g/dL (ref 13.0–17.7)
MCH: 29.1 pg (ref 26.6–33.0)
MCHC: 32.5 g/dL (ref 31.5–35.7)
MCV: 89 fL (ref 79–97)
Platelets: 250 10*3/uL (ref 150–450)
RBC: 5.4 x10E6/uL (ref 4.14–5.80)
RDW: 12.8 % (ref 11.6–15.4)
WBC: 11.7 10*3/uL — ABNORMAL HIGH (ref 3.4–10.8)

## 2022-02-13 LAB — TESTOSTERONE: Testosterone: 199 ng/dL — ABNORMAL LOW (ref 264–916)

## 2022-03-08 ENCOUNTER — Telehealth: Payer: Self-pay | Admitting: "Endocrinology

## 2022-03-08 NOTE — Telephone Encounter (Signed)
Hi, Could you add a new referral for this patient for Dr Dorris Fetch. It looks like he is scheduled to see him 9/19 but the referral we did we have was closed.  Thank you

## 2022-03-10 ENCOUNTER — Other Ambulatory Visit: Payer: Self-pay | Admitting: Nurse Practitioner

## 2022-03-10 DIAGNOSIS — R7989 Other specified abnormal findings of blood chemistry: Secondary | ICD-10-CM

## 2022-03-10 NOTE — Telephone Encounter (Signed)
It looks like you placed it for [facility]. Can you change that to [facility], thank you

## 2022-03-10 NOTE — Telephone Encounter (Signed)
Endocrinology

## 2022-03-10 NOTE — Telephone Encounter (Signed)
Yes. Which specialty is Dr. Dorris Fetch? Thanks.

## 2022-03-10 NOTE — Telephone Encounter (Signed)
Order placed. Thanks.

## 2022-03-10 NOTE — Progress Notes (Signed)
amb  

## 2022-04-13 ENCOUNTER — Ambulatory Visit: Payer: Medicaid Other | Admitting: "Endocrinology

## 2022-04-13 ENCOUNTER — Encounter: Payer: Self-pay | Admitting: "Endocrinology

## 2022-04-13 VITALS — BP 142/90 | HR 76 | Ht 73.0 in | Wt 338.6 lb

## 2022-04-13 DIAGNOSIS — I10 Essential (primary) hypertension: Secondary | ICD-10-CM

## 2022-04-13 DIAGNOSIS — E291 Testicular hypofunction: Secondary | ICD-10-CM | POA: Diagnosis not present

## 2022-04-13 DIAGNOSIS — R7303 Prediabetes: Secondary | ICD-10-CM | POA: Diagnosis not present

## 2022-04-13 MED ORDER — METOPROLOL SUCCINATE ER 25 MG PO TB24: 25.0000 mg | ORAL_TABLET | Freq: Every day | ORAL | 1 refills | Status: DC

## 2022-04-13 NOTE — Progress Notes (Deleted)
$'[name]$  ID: Clayton Pena, male    DOB: 25-Jan-1961, 61 y.o.   MRN: [id number]  Chief Complaint  Patient presents with   Establish Care    Low testosterone    Referring provider: Fenton Foy, NP   HPI  Clayton Pena is a very pleasant 61 year old male with a medical history significant for essential hypertension, coronary artery disease, history of DVT, history of gout, morbid obesity with BMI of 40-40 4.9, history of fatty liver disease, and history of left adrenal adenoma    Patient presents today for follow-up visit.  Patient states that he has been feeling more fatigued.  He was referred to endocrinology at last visit and states that the endocrinologist does need a testosterone level checked.  We will check this in office today.  Overall patient's been doing well other than the fatigue.  He is due for blood work today.  Patient is compliant with medications.  And does need refills today. Denies f/c/s, n/v/d, hemoptysis, PND, leg swelling Denies chest pain or edema     Allergies  Allergen Reactions   Penicillins Anaphylaxis    Has patient had a PCN reaction causing immediate rash, facial/tongue/throat swelling, SOB or lightheadedness with hypotension: unknown Has patient had a PCN reaction causing severe rash involving mucus membranes or skin necrosis: unknown Has patient had a PCN reaction that required hospitalization: unknown Has patient had a PCN reaction occurring within the last 10 years: no If all of the above answers are "NO", then may proceed with Cephalosporin use.    Demerol  [Meperidine Hcl] Other (See Comments)    REACTION:  Increases blood pressure   Demerol [Meperidine] Other (See Comments)    REACTION:  Increases blood pressure    Immunization History  Administered Date(s) Administered   Influenza,inj,Quad PF,6+ Mos 06/15/2016, 05/25/2017, 05/16/2019   Tdap 02/22/2018    Past Medical History:  Diagnosis Date   Acute suppr otitis media w/o spon rupt  ear drum, right ear 05/2019   Angina at rest [facility])    Coronary artery disease    Hypertension     Tobacco History: Social History   Tobacco Use  Smoking Status Never  Smokeless Tobacco Never   Counseling given: Not Answered   Outpatient Encounter Medications as of 04/13/2022  Medication Sig   metoprolol succinate (TOPROL-XL) 25 MG 24 hr tablet Take 1 tablet (25 mg total) by mouth daily.   acetaminophen-codeine (TYLENOL #3) 300-30 MG tablet Take 1 tablet by mouth every 6 (six) hours as needed for moderate pain.   albuterol (VENTOLIN HFA) 108 (90 Base) MCG/ACT inhaler Inhale 2 puffs into the lungs every 6 (six) hours as needed for wheezing or shortness of breath.   apixaban (ELIQUIS) 5 MG TABS tablet Take 1 tablet (5 mg total) by mouth 2 (two) times daily.   benzonatate (TESSALON) 100 MG capsule Take 1 capsule (100 mg total) by mouth 2 (two) times daily as needed for cough. (Patient not taking: Reported on 11/13/2021)   cetirizine (ZYRTEC) 10 MG tablet Take 1 tablet (10 mg total) by mouth at bedtime.   colchicine 0.6 MG tablet Take 1 tablet (0.6 mg total) by mouth every 6 (six) hours as needed. As needed for Gouty Episodes. (Patient not taking: Reported on 11/06/2019)   Elastic Bandages & Supports (MEDICAL COMPRESSION STOCKINGS) MISC 1 each by Does not apply route daily. (Patient not taking: Reported on 02/12/2022)   Ferrous Sulfate (IRON) 325 (65 Fe) MG TABS Take 1 tablet (325 mg total) by  mouth daily with breakfast.   furosemide (LASIX) 20 MG tablet Take 1 tablet (20 mg total) by mouth daily.   hydrochlorothiazide (HYDRODIURIL) 25 MG tablet Take 1 tablet (25 mg total) by mouth daily.   ibuprofen (ADVIL) 200 MG tablet Take 600 mg by mouth every 6 (six) hours as needed for moderate pain.   oseltamivir (TAMIFLU) 75 MG capsule Take 1 capsule (75 mg total) by mouth every 12 (twelve) hours. (Patient not taking: Reported on 11/13/2021)   predniSONE (DELTASONE) 10 MG tablet Take 4 tabs for 2 days,  then 3 tabs for 2 days, then 2 tabs for 2 days, then 1 tab for 2 days, then stop (Patient not taking: Reported on 11/13/2021)   promethazine-dextromethorphan (PROMETHAZINE-DM) 6.25-15 MG/5ML syrup Take 5 mLs by mouth 4 (four) times daily as needed. May cause drowsiness (Patient not taking: Reported on 11/13/2021)   sodium chloride (OCEAN) 0.65 % SOLN nasal spray Place 1 spray into both nostrils as needed for congestion. (Patient not taking: Reported on 11/13/2021)   zinc gluconate 50 MG tablet Take 1 tablet (50 mg total) by mouth daily.   No facility-administered encounter medications on file as of 04/13/2022.     Review of Systems  Review of Systems  Constitutional: Negative.   HENT: Negative.    Cardiovascular: Negative.   Gastrointestinal: Negative.   Allergic/Immunologic: Negative.   Neurological: Negative.   Psychiatric/Behavioral: Negative.         Physical Exam  BP (!) 142/90   Pulse 76   Ht '6\' 1"'$  (1.854 m)   Wt (!) 338 lb 9.6 oz (153.6 kg)   BMI 44.67 kg/m   Wt Readings from Last 5 Encounters:  04/13/22 (!) 338 lb 9.6 oz (153.6 kg)  02/12/22 (!) 346 lb 4 oz (157.1 kg)  11/13/21 (!) 340 lb 3.2 oz (154.3 kg)  08/06/21 (!) 320 lb (145.2 kg)  08/03/21 (!) 330 lb (149.7 kg)     Physical Exam Vitals and nursing note reviewed.  Constitutional:      General: He is not in acute distress.    Appearance: He is well-developed.  Cardiovascular:     Rate and Rhythm: Normal rate and regular rhythm.  Pulmonary:     Effort: Pulmonary effort is normal.     Breath sounds: Normal breath sounds.  Skin:    General: Skin is warm and dry.  Neurological:     Mental Status: He is alert and oriented to person, place, and time.      Lab Results:  CBC    Component Value Date/Time   WBC 11.7 (H) 02/12/2022 0959   WBC 4.7 08/06/2021 0940   RBC 5.40 02/12/2022 0959   RBC 5.55 08/06/2021 0940   HGB 15.7 02/12/2022 0959   HCT 48.3 02/12/2022 0959   PLT 250 02/12/2022 0959    MCV 89 02/12/2022 0959   MCH 29.1 02/12/2022 0959   MCH 29.7 08/06/2021 0940   MCHC 32.5 02/12/2022 0959   MCHC 33.9 08/06/2021 0940   RDW 12.8 02/12/2022 0959   LYMPHSABS 1.7 11/23/2021 0000   MONOABS 0.9 08/06/2021 0940   EOSABS 0.0 11/23/2021 0000   BASOSABS 0.1 11/23/2021 0000    BMET    Component Value Date/Time   NA 137 02/12/2022 0959   K 4.2 02/12/2022 0959   CL 101 02/12/2022 0959   CO2 19 (L) 02/12/2022 0959   GLUCOSE 113 (H) 02/12/2022 0959   GLUCOSE 102 (H) 08/06/2021 0940   BUN 14 02/12/2022 0959  CREATININE 0.80 02/12/2022 0959   CREATININE 0.88 06/24/2017 0809   CALCIUM 9.0 02/12/2022 0959   GFRNONAA >60 08/06/2021 0940   GFRNONAA 95 06/24/2017 0809   GFRAA 90 12/18/2019 0918   GFRAA 110 06/24/2017 0809    BNP    Component Value Date/Time   BNP 44.2 11/06/2019 1159   BNP 43.6 06/13/2016 2136    ProBNP    Component Value Date/Time   PROBNP 31.0 11/07/2008 1242    Imaging: No results found.   Assessment & Plan:   No problem-specific Assessment & Plan notes found for this encounter.     Glade Lloyd, MD 04/13/2022

## 2022-04-13 NOTE — Progress Notes (Signed)
Endocrinology Consult Note   Clayton Pena, 61 y.o., male   Chief Complaint  Patient presents with   Establish Care    Low testosterone     Past Medical History:  Diagnosis Date   Acute suppr otitis media w/o spon rupt ear drum, right ear 05/2019   Angina at rest [facility])    Coronary artery disease    Hypertension    Past Surgical History:  Procedure Laterality Date   BACK SURGERY     CORONARY ANGIOPLASTY     HERNIA REPAIR     Social History   Socioeconomic History   Marital status: Married    Spouse name: Not on file   Number of children: Not on file   Years of education: Not on file   Highest education level: Not on file  Occupational History   Not on file  Tobacco Use   Smoking status: Never   Smokeless tobacco: Never  Vaping Use   Vaping Use: Never used  Substance and Sexual Activity   Alcohol use: No    Comment: occasional   Drug use: No   Sexual activity: Yes    Partners: Female  Other Topics Concern   Not on file  Social History Narrative   Not on file   Social Determinants of Health   Financial Resource Strain: Not on file  Food Insecurity: Not on file  Transportation Needs: Not on file  Physical Activity: Not on file  Stress: Not on file  Social Connections: Not on file   Outpatient Encounter Medications as of 04/13/2022  Medication Sig   metoprolol succinate (TOPROL-XL) 25 MG 24 hr tablet Take 1 tablet (25 mg total) by mouth daily.   acetaminophen-codeine (TYLENOL #3) 300-30 MG tablet Take 1 tablet by mouth every 6 (six) hours as needed for moderate pain.   albuterol (VENTOLIN HFA) 108 (90 Base) MCG/ACT inhaler Inhale 2 puffs into the lungs every 6 (six) hours as needed for wheezing or shortness of breath.   apixaban (ELIQUIS) 5 MG TABS tablet Take 1 tablet (5 mg total) by mouth 2 (two) times daily.   benzonatate (TESSALON) 100 MG capsule Take 1 capsule (100 mg total) by mouth 2 (two) times daily  as needed for cough. (Patient not taking: Reported on 11/13/2021)   cetirizine (ZYRTEC) 10 MG tablet Take 1 tablet (10 mg total) by mouth at bedtime.   colchicine 0.6 MG tablet Take 1 tablet (0.6 mg total) by mouth every 6 (six) hours as needed. As needed for Gouty Episodes. (Patient not taking: Reported on 11/06/2019)   Elastic Bandages & Supports (MEDICAL COMPRESSION STOCKINGS) MISC 1 each by Does not apply route daily. (Patient not taking: Reported on 02/12/2022)   Ferrous Sulfate (IRON) 325 (65 Fe) MG TABS Take 1 tablet (325 mg total) by mouth daily with breakfast.   furosemide (LASIX) 20 MG tablet Take 1 tablet (20 mg total) by mouth daily.   hydrochlorothiazide (HYDRODIURIL) 25 MG tablet Take 1 tablet (25 mg total) by mouth daily.   ibuprofen (ADVIL) 200 MG tablet Take 600 mg by mouth every 6 (six) hours as needed for moderate pain.   oseltamivir (TAMIFLU) 75 MG capsule Take 1 capsule (75 mg total) by mouth every 12 (twelve) hours. (Patient not taking: Reported on 11/13/2021)   predniSONE (DELTASONE) 10 MG tablet Take 4 tabs for 2 days, then 3 tabs for 2 days, then 2 tabs for 2 days, then 1 tab for 2 days, then stop (Patient not taking:  Reported on 11/13/2021)   promethazine-dextromethorphan (PROMETHAZINE-DM) 6.25-15 MG/5ML syrup Take 5 mLs by mouth 4 (four) times daily as needed. May cause drowsiness (Patient not taking: Reported on 11/13/2021)   sodium chloride (OCEAN) 0.65 % SOLN nasal spray Place 1 spray into both nostrils as needed for congestion. (Patient not taking: Reported on 11/13/2021)   zinc gluconate 50 MG tablet Take 1 tablet (50 mg total) by mouth daily.   No facility-administered encounter medications on file as of 04/13/2022.   ALLERGIES: Allergies  Allergen Reactions   Penicillins Anaphylaxis    Has patient had a PCN reaction causing immediate rash, facial/tongue/throat swelling, SOB or lightheadedness with hypotension: unknown Has patient had a PCN reaction causing severe rash  involving mucus membranes or skin necrosis: unknown Has patient had a PCN reaction that required hospitalization: unknown Has patient had a PCN reaction occurring within the last 10 years: no If all of the above answers are "NO", then may proceed with Cephalosporin use.    Demerol  [Meperidine Hcl] Other (See Comments)    REACTION:  Increases blood pressure   Demerol [Meperidine] Other (See Comments)    REACTION:  Increases blood pressure    VACCINATION STATUS: Immunization History  Administered Date(s) Administered   Influenza,inj,Quad PF,6+ Mos 06/15/2016, 05/25/2017, 05/16/2019   Tdap 02/22/2018    HPI: Clayton Pena is a 61 y.o.-year-old man, being seen in consultation for evaluation and management of low testosterone requested by by his   provider  Fenton Foy, NP.  He was found to have hypogonadism with total testostetone of 199 on February 12, 2022. He is not initiated on testosterone supplement or replacement.   He admits for decreased libido. Has difficulty obtaining or maintaining an erection. He denies  direct trauma to testes,  chemotherapy,  testicular irradiation,  nor genitourinary surgery. No h/o cryptorchidism. He denies history of  mumps orchitis/ history of  autoimmune disorders.  He grew and went through puberty like his peers. No personal history of  infertility - has  5 biological children, does not have any immediate future plans for fertility. No incomplete/delayed sexual development.     No breast discomfort/gynecomastia. No abnormal sense of smell . No hot flushes. No vision problems.  No report of changing headaches. No FH of hypogonadism/infertility . No Family history of hemochromatosis or pituitary tumors. He has been overweight/obese for most of his adult life.  His BMI currently is 44.67.  He is Being followed driving which required prolonged sitting.  No major weight change recently. His other medical problems include coronary artery disease,  diabetes, hypertension, DVT, morbid obesity.  He denies exposure to heavy alcohol nor smoking. No anabolic steroids use. No herbal medicines.    ROS: Constitutional: + Mildly fluctuating body weight, + fatigue, no subjective hyperthermia/hypothermia Eyes: no blurry vision, no xerophthalmia ENT: no sore throat, no nodules palpated in throat, no dysphagia/odynophagia, no hoarseness Cardiovascular: no CP/SOB/palpitations/leg swelling Respiratory: no cough/SOB Gastrointestinal: no N/V/D/C Musculoskeletal: no muscle/joint aches Skin: no rashes Neurological: no tremors/numbness/tingling/dizziness Psychiatric: no depression/anxiety  PE: BP (!) 142/90   Pulse 76   Ht '6\' 1"'$  (1.854 m)   Wt (!) 338 lb 9.6 oz (153.6 kg)   BMI 44.67 kg/m  Wt Readings from Last 3 Encounters:  04/13/22 (!) 338 lb 9.6 oz (153.6 kg)  02/12/22 (!) 346 lb 4 oz (157.1 kg)  11/13/21 (!) 340 lb 3.2 oz (154.3 kg)   Constitutional:  Body mass index is 44.67 kg/m.,  not in acute distress, +  Normal State of Mind Eyes: PERRLA, EOMI, no exophthalmos ENT: moist mucous membranes, no thyromegaly, no cervical lymphadenopathy Cardiovascular: RRR, No MRG Respiratory: CTA B Gastrointestinal: abdomen soft, NT, ND, BS+ Musculoskeletal: no deformities, strength intact in all 4 Skin: moist, warm, no rashes Neurological: no tremor with outstretched hands, DTR normal in all 4 Genital exam: + Bilaterally shrunk testicles, no inguinal LAD, normal phallus, testes ~57m, no testicular masses, no penile discharge.  No gynecomastia.   CMP ( most recent) CMP     Component Value Date/Time   NA 137 02/12/2022 0959   K 4.2 02/12/2022 0959   CL 101 02/12/2022 0959   CO2 19 (L) 02/12/2022 0959   GLUCOSE 113 (H) 02/12/2022 0959   GLUCOSE 102 (H) 08/06/2021 0940   BUN 14 02/12/2022 0959   CREATININE 0.80 02/12/2022 0959   CREATININE 0.88 06/24/2017 0809   CALCIUM 9.0 02/12/2022 0959   PROT 6.7 02/12/2022 0959   ALBUMIN 4.0  02/12/2022 0959   AST 31 02/12/2022 0959   ALT 51 (H) 02/12/2022 0959   ALKPHOS 52 02/12/2022 0959   BILITOT 0.3 02/12/2022 0959   GFRNONAA >60 08/06/2021 0940   GFRNONAA 95 06/24/2017 0809   GFRAA 90 12/18/2019 0918   GFRAA 110 06/24/2017 0809     Diabetic Labs (most recent): Lab Results  Component Value Date   HGBA1C 5.9 (A) 11/13/2021   HGBA1C 5.9 11/13/2021   HGBA1C 5.9 11/13/2021   HGBA1C 5.9 11/13/2021     Lipid Panel ( most recent) Lipid Panel     Component Value Date/Time   CHOL 133 11/13/2021 1118   TRIG 123 11/13/2021 1118   HDL 41 11/13/2021 1118   CHOLHDL 3.2 11/13/2021 1118   CHOLHDL 3.2 06/24/2017 0802   VLDL 21 11/08/2008 0550   LDLCALC 70 11/13/2021 1118   LDLCALC 64 06/24/2017 0802   LABVLDL 22 11/13/2021 1118   Testosterone level on February 12, 2022 was 199.   ASSESSMENT: 1. Hypogonadism 2.  Diabetes   PLAN:  I have examined the patient, reviewed his labs and have had a long discussion with the patient regarding his testosterone results.   - I have discussed potential causes of hypogonadism, diagnosis of hypogonadism,  and relevant workup to confirm the diagnosis of hypogonadism before initiating testosterone replacement therapy.   He has a relative contraindications for testosterone replacement including his history of DVT.  -  I  discussed adverse effects of unnecessary testosterone replacement short-term and long-term.  -It is beneficial to determine etiology of hypogonadism before initiation of treatment if possible. His total testosterone at 199 is only slightly abnormal for his age related to declining testosterone level. -I have approached him for more complete laboratory work including repeat of testosterone (total and free), SHBG; FSH/LH; prolactin, ferritin in a morning sample of serum.    -If this workup indicates  Secondary etiology for hypogonadism, he will be considered for MRI imaging of sella/pituitary .  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   Given his high BMI, he is at risk of sleep apnea.  He will need to have sleep study before considering testosterone replacement. His lab work will include TSH and prolactin. -He will benefit the most from weight loss.  We briefly discussed about controlling his diet, improving his sleep, and exercise regularly.   -No prescription was initiated today. Will be considered for extended lifestyle medicine during his next visit.   He is  advised to maintain close follow-up with his PCP. - Time spent with the patient: 50 minutes, of which >50% was spent in obtaining information about his symptoms, reviewing his previous labs, evaluations, and treatments, counseling him about his hypogonadism, prediabetes, obesity, and developing a plan to confirm the diagnosis and long term treatment as necessary.  Clayton Pena participated in the discussions, expressed understanding, and voiced agreement with the above plans.  All questions were answered to his satisfaction. he is encouraged to contact clinic should he have any questions or concerns prior to his return visit.  Return in about 10 days (around 04/23/2022) for Fasting Labs  in AM B4 8, A1c -[place].  Clayton Lloyd, MD [facility] [address] [place], [place] [address] Phone: [phone]  Fax: [phone]   04/13/2022, 4:50 PM  This note was partially dictated with voice recognition software. Similar sounding words can be transcribed inadequately or may not  be corrected upon review.

## 2022-04-19 LAB — CBC WITH DIFFERENTIAL/PLATELET
Basophils Absolute: 0.1 10*3/uL (ref 0.0–0.2)
Basos: 0 %
EOS (ABSOLUTE): 0 10*3/uL (ref 0.0–0.4)
Eos: 0 %
Hematocrit: 47.9 % (ref 37.5–51.0)
Hemoglobin: 16 g/dL (ref 13.0–17.7)
Immature Grans (Abs): 0 10*3/uL (ref 0.0–0.1)
Immature Granulocytes: 0 %
Lymphocytes Absolute: 1.8 10*3/uL (ref 0.7–3.1)
Lymphs: 15 %
MCH: 29.4 pg (ref 26.6–33.0)
MCHC: 33.4 g/dL (ref 31.5–35.7)
MCV: 88 fL (ref 79–97)
Monocytes Absolute: 0.7 10*3/uL (ref 0.1–0.9)
Monocytes: 6 %
Neutrophils Absolute: 9.6 10*3/uL — ABNORMAL HIGH (ref 1.4–7.0)
Neutrophils: 79 %
Platelets: 263 10*3/uL (ref 150–450)
RBC: 5.45 x10E6/uL (ref 4.14–5.80)
RDW: 12.6 % (ref 11.6–15.4)
WBC: 12.2 10*3/uL — ABNORMAL HIGH (ref 3.4–10.8)

## 2022-04-19 LAB — TESTOSTERONE, FREE, TOTAL, SHBG
Sex Hormone Binding: 46.5 nmol/L (ref 19.3–76.4)
Testosterone, Free: 2.8 pg/mL — ABNORMAL LOW (ref 6.6–18.1)
Testosterone: 272 ng/dL (ref 264–916)

## 2022-04-19 LAB — PROLACTIN: Prolactin: 10.8 ng/mL (ref 4.0–15.2)

## 2022-04-19 LAB — PSA: Prostate Specific Ag, Serum: 0.9 ng/mL (ref 0.0–4.0)

## 2022-04-19 LAB — LUTEINIZING HORMONE: LH: 7.3 m[IU]/mL (ref 1.7–8.6)

## 2022-04-19 LAB — FOLLICLE STIMULATING HORMONE: FSH: 10.4 m[IU]/mL (ref 1.5–12.4)

## 2022-04-19 LAB — FERRITIN: Ferritin: 576 ng/mL — ABNORMAL HIGH (ref 30–400)

## 2022-04-27 ENCOUNTER — Ambulatory Visit: Payer: Medicaid Other | Admitting: "Endocrinology

## 2022-04-27 ENCOUNTER — Encounter: Payer: Self-pay | Admitting: "Endocrinology

## 2022-04-27 VITALS — BP 130/86 | HR 84 | Ht 73.0 in | Wt 338.6 lb

## 2022-04-27 DIAGNOSIS — I10 Essential (primary) hypertension: Secondary | ICD-10-CM | POA: Diagnosis not present

## 2022-04-27 DIAGNOSIS — E291 Testicular hypofunction: Secondary | ICD-10-CM | POA: Diagnosis not present

## 2022-04-27 DIAGNOSIS — R7303 Prediabetes: Secondary | ICD-10-CM | POA: Diagnosis not present

## 2022-04-27 LAB — POCT GLYCOSYLATED HEMOGLOBIN (HGB A1C): HbA1c, POC (controlled diabetic range): 6 % (ref 0.0–7.0)

## 2022-04-27 NOTE — Patient Instructions (Signed)

## 2022-04-27 NOTE — Progress Notes (Signed)
Endocrinology follow-up note   Clayton Pena, 61 y.o., male   Chief Complaint  Patient presents with   Follow-up    Prediabetes      Past Medical History:  Diagnosis Date   Acute suppr otitis media w/o spon rupt ear drum, right ear 05/2019   Angina at rest    Coronary artery disease    Hypertension    Past Surgical History:  Procedure Laterality Date   BACK SURGERY     CORONARY ANGIOPLASTY     HERNIA REPAIR     Social History   Socioeconomic History   Marital status: Married    Spouse name: Not on file   Number of children: Not on file   Years of education: Not on file   Highest education level: Not on file  Occupational History   Not on file  Tobacco Use   Smoking status: Never   Smokeless tobacco: Never  Vaping Use   Vaping Use: Never used  Substance and Sexual Activity   Alcohol use: No    Comment: occasional   Drug use: No   Sexual activity: Yes    Partners: Female  Other Topics Concern   Not on file  Social History Narrative   Not on file   Social Determinants of Health   Financial Resource Strain: Not on file  Food Insecurity: Not on file  Transportation Needs: Not on file  Physical Activity: Not on file  Stress: Not on file  Social Connections: Not on file   Outpatient Encounter Medications as of 04/27/2022  Medication Sig   acetaminophen-codeine (TYLENOL #3) 300-30 MG tablet Take 1 tablet by mouth every 6 (six) hours as needed for moderate pain.   albuterol (VENTOLIN HFA) 108 (90 Base) MCG/ACT inhaler Inhale 2 puffs into the lungs every 6 (six) hours as needed for wheezing or shortness of breath.   apixaban (ELIQUIS) 5 MG TABS tablet Take 1 tablet (5 mg total) by mouth 2 (two) times daily.   benzonatate (TESSALON) 100 MG capsule Take 1 capsule (100 mg total) by mouth 2 (two) times daily as needed for cough. (Patient not taking: Reported on 11/13/2021)   cetirizine (ZYRTEC) 10 MG tablet Take 1 tablet (10 mg total) by mouth at bedtime.    colchicine 0.6 MG tablet Take 1 tablet (0.6 mg total) by mouth every 6 (six) hours as needed. As needed for Gouty Episodes. (Patient not taking: Reported on 11/06/2019)   Elastic Bandages & Supports (MEDICAL COMPRESSION STOCKINGS) MISC 1 each by Does not apply route daily. (Patient not taking: Reported on 02/12/2022)   Ferrous Sulfate (IRON) 325 (65 Fe) MG TABS Take 1 tablet (325 mg total) by mouth daily with breakfast.   furosemide (LASIX) 20 MG tablet Take 1 tablet (20 mg total) by mouth daily. (Patient not taking: Reported on 04/27/2022)   hydrochlorothiazide (HYDRODIURIL) 25 MG tablet Take 1 tablet (25 mg total) by mouth daily.   ibuprofen (ADVIL) 200 MG tablet Take 600 mg by mouth every 6 (six) hours as needed for moderate pain.   metoprolol succinate (TOPROL-XL) 25 MG 24 hr tablet Take 1 tablet (25 mg total) by mouth daily. (Patient not taking: Reported on 04/27/2022)   zinc gluconate 50 MG tablet Take 1 tablet (50 mg total) by mouth daily.   [DISCONTINUED] oseltamivir (TAMIFLU) 75 MG capsule Take 1 capsule (75 mg total) by mouth every 12 (twelve) hours. (Patient not taking: Reported on 11/13/2021)   [DISCONTINUED] predniSONE (DELTASONE) 10 MG tablet Take 4 tabs  for 2 days, then 3 tabs for 2 days, then 2 tabs for 2 days, then 1 tab for 2 days, then stop (Patient not taking: Reported on 11/13/2021)   [DISCONTINUED] promethazine-dextromethorphan (PROMETHAZINE-DM) 6.25-15 MG/5ML syrup Take 5 mLs by mouth 4 (four) times daily as needed. May cause drowsiness (Patient not taking: Reported on 11/13/2021)   [DISCONTINUED] sodium chloride (OCEAN) 0.65 % SOLN nasal spray Place 1 spray into both nostrils as needed for congestion. (Patient not taking: Reported on 11/13/2021)   No facility-administered encounter medications on file as of 04/27/2022.   ALLERGIES: Allergies  Allergen Reactions   Penicillins Anaphylaxis    Has patient had a PCN reaction causing immediate rash, facial/tongue/throat swelling, SOB or  lightheadedness with hypotension: unknown Has patient had a PCN reaction causing severe rash involving mucus membranes or skin necrosis: unknown Has patient had a PCN reaction that required hospitalization: unknown Has patient had a PCN reaction occurring within the last 10 years: no If all of the above answers are "NO", then may proceed with Cephalosporin use.    Demerol  [Meperidine Hcl] Other (See Comments)    REACTION:  Increases blood pressure   Demerol [Meperidine] Other (See Comments)    REACTION:  Increases blood pressure    VACCINATION STATUS: Immunization History  Administered Date(s) Administered   Influenza,inj,Quad PF,6+ Mos 06/15/2016, 05/25/2017, 05/16/2019   Tdap 02/22/2018    HPI: Clayton Pena is a 61 y.o.-year-old man, being seen in follow-up after he was seen consultation for evaluation and management of low testosterone requested by by his   provider  Fenton Foy, NP.  He was found to have hypogonadism with total testostetone of 199 on February 12, 2022. However, his previsit labs show adequate testosterone at 272. He is not initiated on testosterone supplement or replacement.   He admits for decreased libido. Has difficulty obtaining or maintaining an erection. He denies  direct trauma to testes,  chemotherapy,  testicular irradiation,  nor genitourinary surgery. No h/o cryptorchidism. He denies history of  mumps orchitis/ history of  autoimmune disorders.  He grew and went through puberty like his peers. No personal history of  infertility - has  5 biological children, does not have any immediate future plans for fertility. No incomplete/delayed sexual development.     No breast discomfort/gynecomastia. No abnormal sense of smell . No hot flushes. No vision problems.  No report of changing headaches. No FH of hypogonadism/infertility . No Family history of hemochromatosis or pituitary tumors. He has been overweight/obese for most of his adult life.  His BMI  currently is 44.67.  His job involves driving which required prolonged sitting.  No major weight change recently. His other medical problems include coronary artery disease, diabetes, hypertension, DVT, morbid obesity.  He denies exposure to heavy alcohol nor smoking. No anabolic steroids use. No herbal medicines.    ROS: Constitutional: + Mildly fluctuating body weight, + fatigue, no subjective hyperthermia/hypothermia   PE: BP 130/86   Pulse 84   Ht '6\' 1"'$  (1.854 m)   Wt (!) 338 lb 9.6 oz (153.6 kg)   BMI 44.67 kg/m  Wt Readings from Last 3 Encounters:  04/27/22 (!) 338 lb 9.6 oz (153.6 kg)  04/13/22 (!) 338 lb 9.6 oz (153.6 kg)  02/12/22 (!) 346 lb 4 oz (157.1 kg)   Constitutional:  Body mass index is 44.67 kg/m.,  not in acute distress, + Normal State of Mind Eyes: PERRLA, EOMI, no exophthalmos  Genital exam: + Bilaterally shrunk testicles,  no inguinal LAD, normal phallus, testes ~70m, no testicular masses, no penile discharge.  No gynecomastia.   CMP ( most recent) CMP     Component Value Date/Time   NA 137 02/12/2022 0959   K 4.2 02/12/2022 0959   CL 101 02/12/2022 0959   CO2 19 (L) 02/12/2022 0959   GLUCOSE 113 (H) 02/12/2022 0959   GLUCOSE 102 (H) 08/06/2021 0940   BUN 14 02/12/2022 0959   CREATININE 0.80 02/12/2022 0959   CREATININE 0.88 06/24/2017 0809   CALCIUM 9.0 02/12/2022 0959   PROT 6.7 02/12/2022 0959   ALBUMIN 4.0 02/12/2022 0959   AST 31 02/12/2022 0959   ALT 51 (H) 02/12/2022 0959   ALKPHOS 52 02/12/2022 0959   BILITOT 0.3 02/12/2022 0959   GFRNONAA >60 08/06/2021 0940   GFRNONAA 95 06/24/2017 0809   GFRAA 90 12/18/2019 0918   GFRAA 110 06/24/2017 0809     Diabetic Labs (most recent): Lab Results  Component Value Date   HGBA1C 6.0 04/27/2022   HGBA1C 5.9 (A) 11/13/2021   HGBA1C 5.9 11/13/2021   HGBA1C 5.9 11/13/2021   HGBA1C 5.9 11/13/2021     Lipid Panel ( most recent) Lipid Panel     Component Value Date/Time   CHOL 133  11/13/2021 1118   TRIG 123 11/13/2021 1118   HDL 41 11/13/2021 1118   CHOLHDL 3.2 11/13/2021 1118   CHOLHDL 3.2 06/24/2017 0802   VLDL 21 11/08/2008 0550   LDLCALC 70 11/13/2021 1118   LDLCALC 64 06/24/2017 0802   LABVLDL 22 11/13/2021 1118   Testosterone level on February 12, 2022 was 199.   ASSESSMENT: 1. Hypogonadism 2.  Diabetes   PLAN:  I have examined the patient, reviewed his repeat labs as well as his existing labs and have had a long discussion with the patient regarding his testosterone results.   -Based on these reviews, he does not have established diagnosis of hypogonadism.  He will not need testosterone initiation at this time.   He will be considered for repeat labs in 6 months including total and free testosterone, SHBG, ferritin. -No indication for serial/pituitary imaging at this time. - In this particular patient with bilaterally shrunk testes it is likely that he has future risk of hypogonadism.    Significant metabolic dysfunction.  His point-of-care A1c is 6%. Given his high BMI, he is at risk of sleep apnea.  He will benefit the most from weight loss.  He is a good candidate for lifestyle medicine.    - he acknowledges that there is a room for improvement in his food and drink choices. - Suggestion is made for him to avoid simple carbohydrates  from his diet including Cakes, Sweet Desserts, Ice Cream, Soda (diet and regular), Sweet Tea, Candies, Chips, Cookies, Store Bought Juices, Alcohol , Artificial Sweeteners,  Coffee Creamer, and "Sugar-free" Products, Lemonade. This will help patient to have more stable blood glucose profile and potentially avoid unintended weight gain.  The following Lifestyle Medicine recommendations according to [place] ([facility] were discussed and and offered to patient and he  agrees to start the journey:  A. Whole Foods, Plant-Based Nutrition comprising of fruits and vegetables, plant-based proteins,  whole-grain carbohydrates was discussed in detail with the patient.   A list for source of those nutrients were also provided to the patient.  Patient will use only water or unsweetened tea for hydration. B.  The need to stay away from risky substances including alcohol, smoking;  obtaining 7 to 9 hours of restorative sleep, at least 150 minutes of moderate intensity exercise weekly, the importance of healthy social connections,  and stress management techniques were discussed. C.  A full color page of  Calorie density of various food groups per pound showing examples of each food groups was provided to the patient.   He is advised to maintain close follow-up with his PCP.  I spent 31 minutes in the care of the patient today including review of labs from Thyroid Function, CMP, and other relevant labs ; imaging/biopsy records (current and previous including abstractions from other facilities); face-to-face time discussing  his lab results and symptoms, medications doses, his options of short and long term treatment based on the latest standards of care / guidelines;   and documenting the encounter.  [name]  participated in the discussions, expressed understanding, and voiced agreement with the above plans.  All questions were answered to his satisfaction. he is encouraged to contact clinic should he have any questions or concerns prior to his return visit.   Return in about 6 months (around 10/27/2022) for Fasting Labs  in AM B4 8, A1c -NV.  Glade Lloyd, MD [facility] [address] [place], [place] [address] Phone: [phone]  Fax: [phone]   04/27/2022, 3:57 PM  This note was partially dictated with voice recognition software. Similar sounding words can be transcribed inadequately or may not  be corrected upon review.

## 2022-07-04 ENCOUNTER — Emergency Department (HOSPITAL_COMMUNITY): Payer: Medicaid Other

## 2022-07-04 ENCOUNTER — Emergency Department (HOSPITAL_COMMUNITY)
Admission: EM | Admit: 2022-07-04 | Discharge: 2022-07-04 | Disposition: A | Discharge status: Home / Self Care | Payer: Medicaid Other | Attending: Emergency Medicine | Admitting: Emergency Medicine

## 2022-07-04 ENCOUNTER — Other Ambulatory Visit: Payer: Self-pay

## 2022-07-04 ENCOUNTER — Encounter (HOSPITAL_COMMUNITY): Payer: Self-pay

## 2022-07-04 DIAGNOSIS — Z7901 Long term (current) use of anticoagulants: Secondary | ICD-10-CM | POA: Insufficient documentation

## 2022-07-04 DIAGNOSIS — J209 Acute bronchitis, unspecified: Secondary | ICD-10-CM | POA: Diagnosis not present

## 2022-07-04 DIAGNOSIS — R0602 Shortness of breath: Secondary | ICD-10-CM | POA: Diagnosis not present

## 2022-07-04 DIAGNOSIS — Z1152 Encounter for screening for COVID-19: Secondary | ICD-10-CM | POA: Diagnosis not present

## 2022-07-04 DIAGNOSIS — I2510 Atherosclerotic heart disease of native coronary artery without angina pectoris: Secondary | ICD-10-CM | POA: Diagnosis not present

## 2022-07-04 DIAGNOSIS — R0981 Nasal congestion: Secondary | ICD-10-CM | POA: Diagnosis not present

## 2022-07-04 DIAGNOSIS — R059 Cough, unspecified: Secondary | ICD-10-CM | POA: Diagnosis not present

## 2022-07-04 LAB — RESP PANEL BY RT-PCR (FLU A&B, COVID) ARPGX2
Influenza A by PCR: NEGATIVE
Influenza B by PCR: NEGATIVE
SARS Coronavirus 2 by RT PCR: NEGATIVE

## 2022-07-04 MED ORDER — DOXYCYCLINE HYCLATE 100 MG PO TABS
100.0000 mg | ORAL_TABLET | Freq: Once | ORAL | Status: AC
  Administered 2022-07-04: 100 mg via ORAL
  Filled 2022-07-04: qty 1

## 2022-07-04 MED ORDER — PREDNISONE 20 MG PO TABS
40.0000 mg | ORAL_TABLET | Freq: Once | ORAL | Status: AC
  Administered 2022-07-04: 40 mg via ORAL
  Filled 2022-07-04: qty 2

## 2022-07-04 MED ORDER — IPRATROPIUM-ALBUTEROL 0.5-2.5 (3) MG/3ML IN SOLN
3.0000 mL | Freq: Once | RESPIRATORY_TRACT | Status: AC
  Administered 2022-07-04: 3 mL via RESPIRATORY_TRACT
  Filled 2022-07-04: qty 3

## 2022-07-04 MED ORDER — ALBUTEROL SULFATE HFA 108 (90 BASE) MCG/ACT IN AERS
2.0000 | INHALATION_SPRAY | Freq: Once | RESPIRATORY_TRACT | Status: AC
  Administered 2022-07-04: 2 via RESPIRATORY_TRACT
  Filled 2022-07-04: qty 6.7

## 2022-07-04 MED ORDER — PREDNISONE 10 MG PO TABS: 20.0000 mg | ORAL_TABLET | Freq: Two times a day (BID) | ORAL | 0 refills | Status: DC

## 2022-07-04 MED ORDER — DOXYCYCLINE HYCLATE 100 MG PO CAPS: 100.0000 mg | ORAL_CAPSULE | Freq: Two times a day (BID) | ORAL | 0 refills | Status: DC

## 2022-07-04 NOTE — ED Triage Notes (Signed)
Pt arrived via POV from home c/o cough and congestion X 3 days. Pt reports his inhalers are empty and OTC has been minimally helpful. Pt endorses strong productive cough.

## 2022-07-04 NOTE — ED Provider Notes (Signed)
[facility] EMERGENCY DEPARTMENT Provider Note   CSN: [id number] Arrival date & time: 07/04/22  5852     History  Chief Complaint  Patient presents with   Cough    Clayton Pena is a 61 y.o. male.  Patient is a 61 year old male with past medical history of coronary artery disease with stent, prior DVT on Eliquis, obesity.  Patient presenting today for evaluation of cough and congestion.  This has been worsening over the past 3 weeks.  He denies that he has been experiencing any fevers.  He denies any chest pain or difficulty breathing.  Patient is out of his inhaler.  The history is provided by the patient.       Home Medications Prior to Admission medications   Medication Sig Start Date End Date Taking? Authorizing Provider  acetaminophen-codeine (TYLENOL #3) 300-30 MG tablet Take 1 tablet by mouth every 6 (six) hours as needed for moderate pain. 11/13/21   Dorena Dew, FNP  albuterol (VENTOLIN HFA) 108 (90 Base) MCG/ACT inhaler Inhale 2 puffs into the lungs every 6 (six) hours as needed for wheezing or shortness of breath. 05/07/19   Dorena Dew, FNP  apixaban (ELIQUIS) 5 MG TABS tablet Take 1 tablet (5 mg total) by mouth 2 (two) times daily. 02/12/22 05/13/22  Fenton Foy, NP  benzonatate (TESSALON) 100 MG capsule Take 1 capsule (100 mg total) by mouth 2 (two) times daily as needed for cough. Patient not taking: Reported on 11/13/2021 03/16/21   Fenton Foy, NP  cetirizine (ZYRTEC) 10 MG tablet Take 1 tablet (10 mg total) by mouth at bedtime. 02/12/22   Fenton Foy, NP  colchicine 0.6 MG tablet Take 1 tablet (0.6 mg total) by mouth every 6 (six) hours as needed. As needed for Gouty Episodes. Patient not taking: Reported on 11/06/2019 06/25/19   Azzie Glatter, FNP  Elastic Bandages & Supports ([place]) [place] 1 each by Does not apply route daily. Patient not taking: Reported on 02/12/2022 12/07/16   Dorena Dew, FNP  Ferrous  Sulfate (IRON) 325 (65 Fe) MG TABS Take 1 tablet (325 mg total) by mouth daily with breakfast. 02/12/22 05/13/22  Fenton Foy, NP  furosemide (LASIX) 20 MG tablet Take 1 tablet (20 mg total) by mouth daily. Patient not taking: Reported on 04/27/2022 02/12/22 05/13/22  Fenton Foy, NP  hydrochlorothiazide (HYDRODIURIL) 25 MG tablet Take 1 tablet (25 mg total) by mouth daily. 02/12/22 05/13/22  Fenton Foy, NP  ibuprofen (ADVIL) 200 MG tablet Take 600 mg by mouth every 6 (six) hours as needed for moderate pain.    [provider]  metoprolol succinate (TOPROL-XL) 25 MG 24 hr tablet Take 1 tablet (25 mg total) by mouth daily. Patient not taking: Reported on 04/27/2022 04/13/22   Cassandria Anger, MD  zinc gluconate 50 MG tablet Take 1 tablet (50 mg total) by mouth daily. 02/27/18   Lanae Boast, FNP      Allergies    Penicillins, Demerol  [meperidine hcl], and Demerol [meperidine]    Review of Systems   Review of Systems  All other systems reviewed and are negative.   Physical Exam Updated Vital Signs BP (!) 162/93   Pulse 90   Temp 98.3 F (36.8 C) (Oral)   Resp 20   Ht '6\' 1"'$  (1.854 m)   Wt (!) 155 kg   SpO2 96%   BMI 45.08 kg/m  Physical Exam Vitals and nursing note  reviewed.  Constitutional:      General: He is not in acute distress.    Appearance: He is well-developed. He is not diaphoretic.  HENT:     Head: Normocephalic and atraumatic.  Cardiovascular:     Rate and Rhythm: Normal rate and regular rhythm.     Heart sounds: No murmur heard.    No friction rub.  Pulmonary:     Effort: Pulmonary effort is normal. No respiratory distress.     Breath sounds: Normal breath sounds. No wheezing or rales.  Abdominal:     General: Bowel sounds are normal. There is no distension.     Palpations: Abdomen is soft.     Tenderness: There is no abdominal tenderness.  Musculoskeletal:        General: Normal range of motion.     Cervical back: Normal range of  motion and neck supple.     Right lower leg: No edema.     Left lower leg: No edema.  Skin:    General: Skin is warm and dry.  Neurological:     Mental Status: He is alert and oriented to person, place, and time.     Coordination: Coordination normal.     ED Results / Procedures / Treatments   Labs (all labs ordered are listed, but only abnormal results are displayed) Labs Reviewed  RESP PANEL BY RT-PCR (FLU A&B, COVID) [id number]    EKG None  Radiology No results found.  Procedures Procedures  {Document cardiac monitor, telemetry assessment procedure when appropriate:1}  Medications Ordered in ED Medications  predniSONE (DELTASONE) tablet 40 mg (has no administration in time range)  doxycycline (VIBRA-TABS) tablet 100 mg (has no administration in time range)  ipratropium-albuterol (DUONEB) 0.5-2.5 (3) MG/3ML nebulizer solution 3 mL (has no administration in time range)  albuterol (VENTOLIN HFA) 108 (90 Base) MCG/ACT inhaler 2 puff (has no administration in time range)    ED Course/ Medical Decision Making/ A&P                           Medical Decision Making Amount and/or Complexity of Data Reviewed Radiology: ordered.  Risk Prescription drug management.   ***  {Document critical care time when appropriate:1} {Document review of labs and clinical decision tools ie heart score, Chads2Vasc2 etc:1}  {Document your independent review of radiology images, and any outside records:1} {Document your discussion with family members, caretakers, and with consultants:1} {Document social determinants of health affecting pt's care:1} {Document your decision making why or why not admission, treatments were needed:1} Final Clinical Impression(s) / ED Diagnoses Final diagnoses:  None    Rx / DC Orders ED Discharge Orders     None

## 2022-07-04 NOTE — Discharge Instructions (Signed)
Begin taking prednisone and doxycycline as prescribed.  Use the albuterol inhaler, 2 puffs every 4 hours as needed.  Drink plenty of fluids and get plenty of rest.

## 2022-07-08 NOTE — Telephone Encounter (Signed)
Caller & Relationship to patient:  MRN #  [id number]   Call Back Number:   Date of Last Office Visit: 02/12/2022     Date of Next Office Visit: 07/14/2022    Medication(s) to be Refilled: Albuterol inhaler  Preferred Pharmacy:   ** Please notify patient to allow 48-72 hours to process** **Let patient know to contact pharmacy at the end of the day to make sure medication is ready. ** **If patient has not been seen in a year or longer, book an appointment **Advise to use MyChart for refill requests OR to contact their pharmacy

## 2022-07-09 ENCOUNTER — Other Ambulatory Visit: Payer: Self-pay

## 2022-07-09 DIAGNOSIS — R053 Chronic cough: Secondary | ICD-10-CM

## 2022-07-09 DIAGNOSIS — R0600 Dyspnea, unspecified: Secondary | ICD-10-CM

## 2022-07-09 DIAGNOSIS — R062 Wheezing: Secondary | ICD-10-CM

## 2022-07-09 MED ORDER — ALBUTEROL SULFATE HFA 108 (90 BASE) MCG/ACT IN AERS: 2.0000 | INHALATION_SPRAY | Freq: Four times a day (QID) | RESPIRATORY_TRACT | 3 refills | Status: AC | PRN

## 2022-07-14 ENCOUNTER — Ambulatory Visit: Payer: Self-pay | Admitting: Nurse Practitioner

## 2022-08-11 ENCOUNTER — Other Ambulatory Visit: Payer: Self-pay | Admitting: Nurse Practitioner

## 2022-08-11 MED ORDER — ACETAMINOPHEN-CODEINE 300-30 MG PO TABS: 1.0000 | ORAL_TABLET | Freq: Four times a day (QID) | ORAL | 0 refills | Status: DC | PRN

## 2022-09-27 ENCOUNTER — Encounter (HOSPITAL_COMMUNITY): Payer: Self-pay | Admitting: Radiology

## 2022-09-27 ENCOUNTER — Other Ambulatory Visit: Payer: Self-pay

## 2022-09-27 ENCOUNTER — Emergency Department (HOSPITAL_COMMUNITY): Payer: Medicaid Other

## 2022-09-27 ENCOUNTER — Emergency Department (HOSPITAL_COMMUNITY)
Admission: EM | Admit: 2022-09-27 | Discharge: 2022-09-27 | Disposition: A | Discharge status: Home / Self Care | Payer: Medicaid Other | Attending: Emergency Medicine | Admitting: Emergency Medicine

## 2022-09-27 DIAGNOSIS — S39012A Strain of muscle, fascia and tendon of lower back, initial encounter: Secondary | ICD-10-CM | POA: Insufficient documentation

## 2022-09-27 DIAGNOSIS — M545 Low back pain, unspecified: Secondary | ICD-10-CM | POA: Diagnosis present

## 2022-09-27 DIAGNOSIS — W1830XA Fall on same level, unspecified, initial encounter: Secondary | ICD-10-CM | POA: Diagnosis not present

## 2022-09-27 MED ORDER — ONDANSETRON 4 MG PO TBDP
4.0000 mg | ORAL_TABLET | Freq: Once | ORAL | Status: AC
  Administered 2022-09-27: 4 mg via ORAL
  Filled 2022-09-27: qty 1

## 2022-09-27 MED ORDER — OXYCODONE-ACETAMINOPHEN 5-325 MG PO TABS: 1.0000 | ORAL_TABLET | Freq: Four times a day (QID) | ORAL | 0 refills | Status: DC | PRN

## 2022-09-27 MED ORDER — HYDROMORPHONE HCL 1 MG/ML IJ SOLN
1.0000 mg | Freq: Once | INTRAMUSCULAR | Status: AC
  Administered 2022-09-27: 1 mg via INTRAMUSCULAR
  Filled 2022-09-27: qty 1

## 2022-09-27 MED ORDER — OXYCODONE-ACETAMINOPHEN 5-325 MG PO TABS: 1.0000 | ORAL_TABLET | ORAL | 0 refills | Status: DC | PRN

## 2022-09-27 MED ORDER — CYCLOBENZAPRINE HCL 5 MG PO TABS: 5.0000 mg | ORAL_TABLET | Freq: Three times a day (TID) | ORAL | 0 refills | Status: DC | PRN

## 2022-09-27 NOTE — ED Provider Notes (Signed)
[facility] Provider Note   CSN: [id number] Arrival date & time: 09/27/22  1627     History  No chief complaint on file.   Clayton Pena is a 62 y.o. male presenting with escalating pain at his lower back, across his right pelvis with soreness of his right medial thigh since he had a near fall yesterday.  He describes catching himself from falling yesterday,  got tangled in bedsheets while trying to stand, caught himself with the bedpost so did not fall on the floor, but suspects he strained his lower back and pelvis area.  He denies groin pain, weakness or numbness in his legs.  He has Tylenol 3 which he uses sparingly for his chronic back pain.  It is not helped him with this new pain.  The history is provided by the patient.       Home Medications Prior to Admission medications   Medication Sig Start Date End Date Taking? Authorizing Provider  cyclobenzaprine (FLEXERIL) 5 MG tablet Take 1 tablet (5 mg total) by mouth 3 (three) times daily as needed for muscle spasms. 09/27/22  Yes Constance Hackenberg, Almyra Free, PA-C  oxyCODONE-acetaminophen (PERCOCET/ROXICET) 5-325 MG tablet Take 1 tablet by mouth every 4 (four) hours as needed. 09/27/22  Yes Marsden Zaino, Almyra Free, PA-C  oxyCODONE-acetaminophen (PERCOCET/ROXICET) 5-325 MG tablet Take 1 tablet by mouth every 6 (six) hours as needed for severe pain. 09/27/22  Yes Zipporah Finamore, Almyra Free, PA-C  acetaminophen-codeine (TYLENOL #3) 300-30 MG tablet Take 1 tablet by mouth every 6 (six) hours as needed for moderate pain. 11/13/21   Dorena Dew, FNP  acetaminophen-codeine (TYLENOL #3) 300-30 MG tablet Take 1 tablet by mouth every 6 (six) hours as needed for moderate pain. 08/11/22   Fenton Foy, NP  albuterol (VENTOLIN HFA) 108 (90 Base) MCG/ACT inhaler Inhale 2 puffs into the lungs every 6 (six) hours as needed for wheezing or shortness of breath. 07/09/22   Fenton Foy, NP  apixaban (ELIQUIS) 5 MG TABS tablet Take 1 tablet (5 mg  total) by mouth 2 (two) times daily. 02/12/22 05/13/22  Fenton Foy, NP  benzonatate (TESSALON) 100 MG capsule Take 1 capsule (100 mg total) by mouth 2 (two) times daily as needed for cough. Patient not taking: Reported on 11/13/2021 03/16/21   Fenton Foy, NP  cetirizine (ZYRTEC) 10 MG tablet Take 1 tablet (10 mg total) by mouth at bedtime. 02/12/22   Fenton Foy, NP  colchicine 0.6 MG tablet Take 1 tablet (0.6 mg total) by mouth every 6 (six) hours as needed. As needed for Gouty Episodes. Patient not taking: Reported on 11/06/2019 06/25/19   Azzie Glatter, FNP  doxycycline (VIBRAMYCIN) 100 MG capsule Take 1 capsule (100 mg total) by mouth 2 (two) times daily. One po bid x 7 days 07/04/22   Veryl Speak, MD  Elastic Bandages & Supports (MEDICAL COMPRESSION STOCKINGS) MISC 1 each by Does not apply route daily. Patient not taking: Reported on 02/12/2022 12/07/16   Dorena Dew, FNP  Ferrous Sulfate (IRON) 325 (65 Fe) MG TABS Take 1 tablet (325 mg total) by mouth daily with breakfast. 02/12/22 05/13/22  Fenton Foy, NP  furosemide (LASIX) 20 MG tablet Take 1 tablet (20 mg total) by mouth daily. Patient not taking: Reported on 04/27/2022 02/12/22 05/13/22  Fenton Foy, NP  hydrochlorothiazide (HYDRODIURIL) 25 MG tablet Take 1 tablet (25 mg total) by mouth daily. 02/12/22 05/13/22  Fenton Foy, NP  ibuprofen (ADVIL) 200 MG tablet Take 600 mg by mouth every 6 (six) hours as needed for moderate pain.    [provider]  metoprolol succinate (TOPROL-XL) 25 MG 24 hr tablet Take 1 tablet (25 mg total) by mouth daily. Patient not taking: Reported on 04/27/2022 04/13/22   Cassandria Anger, MD  predniSONE (DELTASONE) 10 MG tablet Take 2 tablets (20 mg total) by mouth 2 (two) times daily. 07/04/22   Veryl Speak, MD  zinc gluconate 50 MG tablet Take 1 tablet (50 mg total) by mouth daily. 02/27/18   Lanae Boast, FNP      Allergies    Penicillins, Demerol  [meperidine  hcl], and Demerol [meperidine]    Review of Systems   Review of Systems  Constitutional:  Negative for fever.  Respiratory:  Negative for shortness of breath.   Cardiovascular:  Negative for chest pain and leg swelling.  Gastrointestinal:  Negative for abdominal distention, abdominal pain and constipation.  Genitourinary:  Negative for difficulty urinating, dysuria, flank pain, frequency and urgency.  Musculoskeletal:  Positive for arthralgias and back pain. Negative for gait problem and joint swelling.  Skin:  Negative for rash.  Neurological:  Negative for weakness and numbness.    Physical Exam Updated Vital Signs BP (!) 154/98 (BP Location: Right Arm)   Pulse 82   Temp 98.8 F (37.1 C) (Oral)   Resp 17   Ht '6\' 1"'$  (1.854 m)   Wt (!) 147 kg   SpO2 96%   BMI 42.75 kg/m  Physical Exam Vitals and nursing note reviewed.  Constitutional:      Appearance: He is well-developed.  HENT:     Head: Normocephalic.  Eyes:     Conjunctiva/sclera: Conjunctivae normal.  Cardiovascular:     Rate and Rhythm: Normal rate.  Pulmonary:     Effort: Pulmonary effort is normal.  Abdominal:     General: Bowel sounds are normal. There is no distension.     Palpations: Abdomen is soft. There is no mass.  Musculoskeletal:     Cervical back: Normal range of motion and neck supple.     Lumbar back: Tenderness present. No swelling, edema or spasms.     Comments: Midline lumbar and right paralumbar tenderness.  No palpable midline deformity.  Skin:    General: Skin is warm and dry.  Neurological:     Mental Status: He is alert.     Sensory: No sensory deficit.     Motor: No tremor or atrophy.     Gait: Gait normal.     Comments: No strength deficit noted in hip and knee flexor and extensor muscle groups.  Ankle flexion and extension intact.     ED Results / Procedures / Treatments   Labs (all labs ordered are listed, but only abnormal results are displayed) Labs Reviewed - No data to  display  EKG None  Radiology DG Lumbar Spine Complete  Result Date: 09/27/2022 CLINICAL DATA:  Recent fall low back pain radiating into the left hip, initial encounter EXAM: LUMBAR SPINE - COMPLETE 4+ VIEW COMPARISON:  07/21/2012 FINDINGS: Five lumbar type vertebral bodies are well visualized. Vertebral body height is well maintained. Multilevel osteophytic changes are seen. Multilevel facet hypertrophic changes are noted as well. No acute abnormality is seen. No soft tissue abnormality is noted. IMPRESSION: Progressive degenerative change when compared with the prior exam. Electronically Signed   By: Inez Catalina M.D.   On: 09/27/2022 21:55   DG Femur Min 2  Views Right  Result Date: 09/27/2022 CLINICAL DATA:  Fall, right hip and femur pain EXAM: RIGHT FEMUR 2 VIEWS COMPARISON:  09/27/2022, 09/21/2016 FINDINGS: Intact right femur without acute fracture or malalignment. Interval progression of the right knee tricompartmental osteoarthritis with further joint space loss and bony spurring. No large knee effusion. IMPRESSION: 1. No acute fracture or malalignment. 2. Progressive right knee osteoarthritis. Electronically Signed   By: Jerilynn Mages.  Shick M.D.   On: 09/27/2022 17:41   DG Pelvis 1-2 Views  Result Date: 09/27/2022 CLINICAL DATA:  Fall, right hip and thigh pain EXAM: PELVIS - 1-2 VIEW COMPARISON:  None Available. FINDINGS: Bony pelvis and hips appear symmetric and intact. Degenerative changes of the lumbosacral spine with endplate osteophytes. Normal SI joints for age. No diastasis. Nonobstructive bowel gas pattern. IMPRESSION: No acute finding by plain radiography. Electronically Signed   By: Jerilynn Mages.  Shick M.D.   On: 09/27/2022 17:39    Procedures Procedures    Medications Ordered in ED Medications  ondansetron (ZOFRAN-ODT) disintegrating tablet 4 mg (4 mg Oral Given 09/27/22 2117)  HYDROmorphone (DILAUDID) injection 1 mg (1 mg Intramuscular Given 09/27/22 2117)    ED Course/ Medical Decision Making/  A&P                             Medical Decision Making Pt with midline lumbar to right lateral paralumbar pain after a near fall injury yesterday, suggesting musculoskeletal strain.  Imaging today is reassuring.  He has no neurologic deficits on today's exam.  He is being prescribed pain medication and muscle relaxer.  We also discussed heat therapy.  Plan follow-up with his primary provider if symptoms or not improving over the next week.  Amount and/or Complexity of Data Reviewed Radiology: ordered.    Details: Imaging reviewed and discussed with patient.  I agree with interpretations.  Risk Prescription drug management.           Final Clinical Impression(s) / ED Diagnoses Final diagnoses:  Strain of lumbar region, initial encounter    Rx / DC Orders ED Discharge Orders          Ordered    oxyCODONE-acetaminophen (PERCOCET/ROXICET) 5-325 MG tablet  Every 4 hours PRN        09/27/22 2232    oxyCODONE-acetaminophen (PERCOCET/ROXICET) 5-325 MG tablet  Every 6 hours PRN        09/27/22 2232    cyclobenzaprine (FLEXERIL) 5 MG tablet  3 times daily PRN        09/27/22 2232              Evalee Jefferson, PA-C 09/27/22 2236    Hayden Rasmussen, MD 09/28/22 1030

## 2022-09-27 NOTE — ED Triage Notes (Signed)
Pt states he had a mechanical fall yesterday (got tripped up in his bed sheets). Pt now having right hip and femur pain.

## 2022-09-27 NOTE — ED Notes (Signed)
Patient transported to X-ray 

## 2022-09-27 NOTE — Discharge Instructions (Signed)
Take medications as prescribed, I recommend separating the oxycodone Flexeril by at least 2 hours however, these medicines can cause drowsiness, do not drive within 4 hours of taking these medications.  Hold your Tylenol 3 while taking this stronger pain reliever.  I also recommend a heating pad for 20 minutes several times daily to your lower back.  Plan follow-up care with your primary doctor if your symptoms are not improving over the next week with this plan.

## 2022-09-28 MED FILL — Oxycodone w/ Acetaminophen Tab 5-325 MG: ORAL | Qty: 6 | Status: AC

## 2022-10-27 ENCOUNTER — Ambulatory Visit: Payer: Medicaid Other | Admitting: "Endocrinology

## 2022-12-27 DIAGNOSIS — I110 Hypertensive heart disease with heart failure: Secondary | ICD-10-CM | POA: Diagnosis not present

## 2022-12-27 DIAGNOSIS — Z8249 Family history of ischemic heart disease and other diseases of the circulatory system: Secondary | ICD-10-CM | POA: Diagnosis not present

## 2022-12-27 DIAGNOSIS — Z833 Family history of diabetes mellitus: Secondary | ICD-10-CM | POA: Diagnosis not present

## 2022-12-27 DIAGNOSIS — I509 Heart failure, unspecified: Secondary | ICD-10-CM | POA: Diagnosis not present

## 2022-12-27 DIAGNOSIS — I252 Old myocardial infarction: Secondary | ICD-10-CM | POA: Diagnosis not present

## 2022-12-27 DIAGNOSIS — Z823 Family history of stroke: Secondary | ICD-10-CM | POA: Diagnosis not present

## 2022-12-27 DIAGNOSIS — Z809 Family history of malignant neoplasm, unspecified: Secondary | ICD-10-CM | POA: Diagnosis not present

## 2022-12-27 DIAGNOSIS — Z880 Allergy status to penicillin: Secondary | ICD-10-CM | POA: Diagnosis not present

## 2022-12-27 DIAGNOSIS — Z86718 Personal history of other venous thrombosis and embolism: Secondary | ICD-10-CM | POA: Diagnosis not present

## 2022-12-27 DIAGNOSIS — G8929 Other chronic pain: Secondary | ICD-10-CM | POA: Diagnosis not present

## 2022-12-27 DIAGNOSIS — Z7901 Long term (current) use of anticoagulants: Secondary | ICD-10-CM | POA: Diagnosis not present

## 2023-03-03 ENCOUNTER — Other Ambulatory Visit: Payer: Self-pay | Admitting: Nurse Practitioner

## 2023-03-03 DIAGNOSIS — Z86718 Personal history of other venous thrombosis and embolism: Secondary | ICD-10-CM

## 2023-03-03 DIAGNOSIS — Z7901 Long term (current) use of anticoagulants: Secondary | ICD-10-CM

## 2023-03-03 DIAGNOSIS — R600 Localized edema: Secondary | ICD-10-CM

## 2023-03-23 ENCOUNTER — Emergency Department (HOSPITAL_COMMUNITY): Payer: 59

## 2023-03-23 ENCOUNTER — Emergency Department (HOSPITAL_COMMUNITY)
Admission: EM | Admit: 2023-03-23 | Discharge: 2023-03-23 | Disposition: A | Discharge status: Home / Self Care | Payer: 59 | Attending: Emergency Medicine | Admitting: Emergency Medicine

## 2023-03-23 ENCOUNTER — Other Ambulatory Visit: Payer: Self-pay

## 2023-03-23 DIAGNOSIS — Z79899 Other long term (current) drug therapy: Secondary | ICD-10-CM | POA: Diagnosis not present

## 2023-03-23 DIAGNOSIS — K047 Periapical abscess without sinus: Secondary | ICD-10-CM | POA: Diagnosis not present

## 2023-03-23 DIAGNOSIS — R0789 Other chest pain: Secondary | ICD-10-CM | POA: Diagnosis not present

## 2023-03-23 DIAGNOSIS — D72829 Elevated white blood cell count, unspecified: Secondary | ICD-10-CM | POA: Insufficient documentation

## 2023-03-23 DIAGNOSIS — I2510 Atherosclerotic heart disease of native coronary artery without angina pectoris: Secondary | ICD-10-CM | POA: Diagnosis not present

## 2023-03-23 DIAGNOSIS — Z7901 Long term (current) use of anticoagulants: Secondary | ICD-10-CM | POA: Insufficient documentation

## 2023-03-23 DIAGNOSIS — I10 Essential (primary) hypertension: Secondary | ICD-10-CM | POA: Insufficient documentation

## 2023-03-23 DIAGNOSIS — R079 Chest pain, unspecified: Secondary | ICD-10-CM | POA: Diagnosis not present

## 2023-03-23 DIAGNOSIS — K0889 Other specified disorders of teeth and supporting structures: Secondary | ICD-10-CM | POA: Diagnosis not present

## 2023-03-23 LAB — BASIC METABOLIC PANEL
Anion gap: 13 (ref 5–15)
BUN: 17 mg/dL (ref 8–23)
CO2: 23 mmol/L (ref 22–32)
Calcium: 8.8 mg/dL — ABNORMAL LOW (ref 8.9–10.3)
Chloride: 102 mmol/L (ref 98–111)
Creatinine, Ser: 0.87 mg/dL (ref 0.61–1.24)
GFR, Estimated: >60 mL/min (ref >60)
Glucose, Bld: 97 mg/dL (ref 70–99)
Potassium: 3.8 mmol/L (ref 3.5–5.1)
Sodium: 138 mmol/L (ref 135–145)

## 2023-03-23 LAB — CBC
HCT: 47.3 % (ref 39.0–52.0)
Hemoglobin: 15.3 g/dL (ref 13.0–17.0)
MCH: 29.5 pg (ref 26.0–34.0)
MCHC: 32.3 g/dL (ref 30.0–36.0)
MCV: 91.1 fL (ref 80.0–100.0)
Platelets: 239 10*3/uL (ref 150–400)
RBC: 5.19 MIL/uL (ref 4.22–5.81)
RDW: 13.3 % (ref 11.5–15.5)
WBC: 16.2 10*3/uL — ABNORMAL HIGH (ref 4.0–10.5)
nRBC: 0 % (ref 0.0–0.2)

## 2023-03-23 LAB — TROPONIN I (HIGH SENSITIVITY)
Troponin I (High Sensitivity): 5 ng/L (ref <18)
Troponin I (High Sensitivity): 5 ng/L (ref <18)

## 2023-03-23 MED ORDER — ACETAMINOPHEN 325 MG PO TABS: 650.0000 mg | ORAL_TABLET | Freq: Once | ORAL | Status: DC

## 2023-03-23 MED ORDER — CLINDAMYCIN HCL 150 MG PO CAPS: 150.0000 mg | ORAL_CAPSULE | Freq: Four times a day (QID) | ORAL | 0 refills | Status: DC

## 2023-03-23 MED ORDER — CLINDAMYCIN HCL 150 MG PO CAPS
450.0000 mg | ORAL_CAPSULE | Freq: Once | ORAL | Status: AC
  Administered 2023-03-23: 450 mg via ORAL
  Filled 2023-03-23: qty 3

## 2023-03-23 NOTE — Discharge Instructions (Addendum)
Evaluation today for your dental pain revealed the likely have a dental infection.  Sent clindamycin which is an antibiotic to treat the infection to your pharmacy.  Please pick that up tomorrow.  Please also follow-up with a dentist:  Please call Dr. Sonda Rumble (dentist here in [place]) and schedule an appointment as soon as possible. Number is [phone]  Evaluation for your chest pain was overall reassuring.  Recommend you follow-up with your PCP.  If your symptoms return, or you start to experience shortness of breath or any other concerning symptom please return emergency department for reevaluation.

## 2023-03-23 NOTE — ED Notes (Signed)
Patient sitting on side of bed. No distress noted.

## 2023-03-23 NOTE — ED Triage Notes (Signed)
During triage pt states I am also tight in my chest for the past 3 hours. Denies SOB, n/v but does feel lightheaded.

## 2023-03-23 NOTE — ED Notes (Signed)
See triage notes. Pt states while he was driving earlier he had tightness across upper chest with some diaphoresis that lasted approx 30 min and went away. Has not had it since. Pt c/o left jaw pain due to toothache as well. Pt a/o. Color wnl. Non diaphoretic and denies any chest tightness/pain at this time. Nad.

## 2023-03-23 NOTE — ED Provider Notes (Signed)
[place] EMERGENCY DEPARTMENT AT [facility] Provider Note   CSN: [id number] Arrival date & time: 03/23/23  1449     History  Chief Complaint  Patient presents with   Dental Pain   Chest Pain   HPI Clayton Pena is a 62 y.o. male with CAD, history of DVTs on Eliquis, hypertension and prediabetes presenting for dental pain and chest pain.  Chest pain started at 11 AM.  It came on all of a sudden was in the left chest and felt like a sharp pain.  It was nonradiating and nonpleuritic.  Lasted for 20 minutes and completely resolved.  Not reporting no chest pain at this time.  No associated shortness of breath.  Patient also here for dental pain.  Pain is in the posterior lower left teeth.  Has been going on for about 10 days.  Endorses swelling in the gums some bleeding and swelling of the cheek as well.   Dental Pain Chest Pain      Home Medications Prior to Admission medications   Medication Sig Start Date End Date Taking? Authorizing Provider  clindamycin (CLEOCIN) 150 MG capsule Take 1 capsule (150 mg total) by mouth every 6 (six) hours. 03/23/23  Yes Gareth Eagle, PA-C  acetaminophen-codeine (TYLENOL #3) 300-30 MG tablet Take 1 tablet by mouth every 6 (six) hours as needed for moderate pain. 11/13/21   Massie Maroon, FNP  acetaminophen-codeine (TYLENOL #3) 300-30 MG tablet Take 1 tablet by mouth every 6 (six) hours as needed for moderate pain. 08/11/22   Ivonne Andrew, NP  albuterol (VENTOLIN HFA) 108 (90 Base) MCG/ACT inhaler Inhale 2 puffs into the lungs every 6 (six) hours as needed for wheezing or shortness of breath. 07/09/22   Ivonne Andrew, NP  benzonatate (TESSALON) 100 MG capsule Take 1 capsule (100 mg total) by mouth 2 (two) times daily as needed for cough. Patient not taking: Reported on 11/13/2021 03/16/21   Ivonne Andrew, NP  cetirizine (ZYRTEC) 10 MG tablet Take 1 tablet (10 mg total) by mouth at bedtime. 02/12/22   Ivonne Andrew, NP   colchicine 0.6 MG tablet Take 1 tablet (0.6 mg total) by mouth every 6 (six) hours as needed. As needed for Gouty Episodes. Patient not taking: Reported on 11/06/2019 06/25/19   Kallie Locks, FNP  cyclobenzaprine (FLEXERIL) 5 MG tablet Take 1 tablet (5 mg total) by mouth 3 (three) times daily as needed for muscle spasms. 09/27/22   Burgess Amor, PA-C  doxycycline (VIBRAMYCIN) 100 MG capsule Take 1 capsule (100 mg total) by mouth 2 (two) times daily. One po bid x 7 days 07/04/22   Geoffery Lyons, MD  Elastic Bandages & Supports (MEDICAL COMPRESSION STOCKINGS) MISC 1 each by Does not apply route daily. Patient not taking: Reported on 02/12/2022 12/07/16   Massie Maroon, FNP  ELIQUIS 5 MG TABS tablet Take 1 tablet by mouth twice daily 03/03/23   Ivonne Andrew, NP  Ferrous Sulfate (IRON) 325 (65 Fe) MG TABS Take 1 tablet by mouth once daily with breakfast 03/03/23   Ivonne Andrew, NP  furosemide (LASIX) 20 MG tablet Take 1 tablet by mouth once daily 03/03/23   Ivonne Andrew, NP  hydrochlorothiazide (HYDRODIURIL) 25 MG tablet Take 1 tablet (25 mg total) by mouth daily. 02/12/22 05/13/22  Ivonne Andrew, NP  ibuprofen (ADVIL) 200 MG tablet Take 600 mg by mouth every 6 (six) hours as needed for moderate pain.  [provider]  metoprolol succinate (TOPROL-XL) 25 MG 24 hr tablet Take 1 tablet (25 mg total) by mouth daily. Patient not taking: Reported on 04/27/2022 04/13/22   Roma Kayser, MD  oxyCODONE-acetaminophen (PERCOCET/ROXICET) 5-325 MG tablet Take 1 tablet by mouth every 4 (four) hours as needed. 09/27/22   Burgess Amor, PA-C  oxyCODONE-acetaminophen (PERCOCET/ROXICET) 5-325 MG tablet Take 1 tablet by mouth every 6 (six) hours as needed for severe pain. 09/27/22   Burgess Amor, PA-C  predniSONE (DELTASONE) 10 MG tablet Take 2 tablets (20 mg total) by mouth 2 (two) times daily. 07/04/22   Geoffery Lyons, MD  zinc gluconate 50 MG tablet Take 1 tablet (50 mg total) by mouth daily.  02/27/18   Mike Gip, FNP      Allergies    Penicillins, Demerol  [meperidine hcl], and Demerol [meperidine]    Review of Systems   Review of Systems  HENT:  Positive for dental problem.   Cardiovascular:  Positive for chest pain.    Physical Exam Updated Vital Signs BP (!) 176/97 (BP Location: Left Arm)   Pulse 77   Temp 98.2 F (36.8 C)   Resp 18   Ht 6\' 1"  (1.854 m)   Wt (!) 153.6 kg   SpO2 98%   BMI 44.67 kg/m  Physical Exam Vitals and nursing note reviewed.  HENT:     Head: Normocephalic and atraumatic.     Mouth/Throat:     Mouth: Mucous membranes are moist.     Tongue: No lesions. Tongue does not deviate from midline.     Pharynx: Oropharynx is clear. Uvula midline. No pharyngeal swelling or oropharyngeal exudate.     Tonsils: No tonsillar exudate or tonsillar abscesses.   Eyes:     General:        Right eye: No discharge.        Left eye: No discharge.     Conjunctiva/sclera: Conjunctivae normal.  Cardiovascular:     Rate and Rhythm: Normal rate and regular rhythm.     Pulses: Normal pulses.     Heart sounds: Normal heart sounds.  Pulmonary:     Effort: Pulmonary effort is normal.     Breath sounds: Normal breath sounds.  Abdominal:     General: Abdomen is flat.     Palpations: Abdomen is soft.  Musculoskeletal:     Cervical back: Full passive range of motion without pain and neck supple.  Lymphadenopathy:     Cervical: No cervical adenopathy.  Skin:    General: Skin is warm and dry.  Neurological:     General: No focal deficit present.  Psychiatric:        Mood and Affect: Mood normal.     ED Results / Procedures / Treatments   Labs (all labs ordered are listed, but only abnormal results are displayed) Labs Reviewed  BASIC METABOLIC PANEL - Abnormal; Notable for the following components:      Result Value   Calcium 8.8 (*)    All other components within normal limits  CBC - Abnormal; Notable for the following components:   WBC 16.2  (*)    All other components within normal limits  TROPONIN I (HIGH SENSITIVITY)  TROPONIN I (HIGH SENSITIVITY)    EKG EKG Interpretation Date/Time:  Wednesday March 23 2023 15:31:41 EDT Ventricular Rate:  88 PR Interval:  158 QRS Duration:  80 QT Interval:  370 QTC Calculation: 447 R Axis:   56  Text Interpretation: Normal sinus  rhythm Normal ECG When compared with ECG of 04-Jul-2022 05:00, No significant change since last tracing Confirmed by Benjiman Core [phone]) on 03/23/2023 8:13:52 PM  Radiology DG Chest 2 View  Result Date: 03/23/2023 CLINICAL DATA:  Chest pain EXAM: CHEST - 2 VIEW COMPARISON:  X-ray 07/04/2022 FINDINGS: No consolidation, pneumothorax or effusion. No edema. Normal cardiopericardial silhouette. Degenerative changes along the spine. IMPRESSION: No acute cardiopulmonary disease. Electronically Signed   By: Karen Kays M.D.   On: 03/23/2023 17:17    Procedures Procedures    Medications Ordered in ED Medications  acetaminophen (TYLENOL) tablet 650 mg (650 mg Oral Patient Refused/Not Given 03/23/23 2011)  clindamycin (CLEOCIN) capsule 450 mg (450 mg Oral Given 03/23/23 1906)    ED Course/ Medical Decision Making/ A&P             HEART Score: 3                    Medical Decision Making Amount and/or Complexity of Data Reviewed Labs: ordered. Radiology: ordered.  Risk OTC drugs. Prescription drug management.   Initial Impression and Ddx 62 year old well-appearing male presenting for chest pain and dental pain.  Exam notable for dental cavity in the lower left posterior tooth with surrounding gingival edema.  DDx includes dental infection, dental abscess, Ludwig angina, other deep space abscess, and sepsis.  For his chest pain DDx includes ACS, PE, MSK, pneumothorax or other Patient PMH that increases complexity of ED encounter:  CAD, history of DVTs on Eliquis, hypertension and prediabetes  Interpretation of Diagnostics - I independent reviewed  and interpreted the labs as followed: Leukocytosis  - I independently visualized the following imaging with scope of interpretation limited to determining acute life threatening conditions related to emergency care: Chest x-ray, which revealed no acute findings  -I personally reviewed interpret EKG which revealed normal sinus rhythm  Patient Reassessment and Ultimate Disposition/Management Findings in the mouth suggestive of dental infection but do not suggest Ludwig's angina or other deep space abscess.  Suspect the leukocytosis and low-grade temp is related to the ongoing dental infection.  Treated with clindamycin as patient has known penicillin allergy.  Chest pain workup was overall reassuring.  Patient remained chest pain-free and without shortness of breath throughout encounter.  Have low suspicion for PE or ACS.  Hypertension noted during encounter.  Patient stated that he missed his hydrochlorothiazide dose because he was here.  Advised him to take it when he returned home.  Discussed pertinent return precautions.  Advised to follow-up with his PCP.  Advise follow-up also with a dentist.  Discharge home in good condition.  Patient management required discussion with the following services or consulting groups:  None  Complexity of Problems Addressed Acute complicated illness or Injury  Additional Data Reviewed and Analyzed Further history obtained from: Past medical history and medications listed in the EMR and Prior ED visit notes  Patient Encounter Risk Assessment Prescriptions         Final Clinical Impression(s) / ED Diagnoses Final diagnoses:  Dental infection  Atypical chest pain    Rx / DC Orders ED Discharge Orders          Ordered    clindamycin (CLEOCIN) 150 MG capsule  Every 6 hours        03/23/23 1859              Gareth Eagle, PA-C 03/23/23 2047    Benjiman Core, MD 03/24/23 0002

## 2023-03-23 NOTE — ED Notes (Signed)
Patient updated on lab results.

## 2023-03-23 NOTE — ED Triage Notes (Signed)
Pt complains of left jaw/dental pain x 10 days. Pt states hurts when brush teeth and gums have been bleeding more with brushing. Denies any fever.

## 2023-04-07 ENCOUNTER — Telehealth: Payer: Self-pay

## 2023-04-07 NOTE — Telephone Encounter (Signed)
Transition Care Management Unsuccessful Follow-up Telephone Call  Date of discharge and from where:  Clayton Pena 8/28  Attempts:  1st Attempt  Reason for unsuccessful TCM follow-up call:  No answer/busy   Clayton Pena [place]  [facility], [facility] Guide, Phone: [phone] Website: [email or web address]

## 2023-04-08 ENCOUNTER — Telehealth: Payer: Self-pay

## 2023-04-08 NOTE — Telephone Encounter (Signed)
Transition Care Management Unsuccessful Follow-up Telephone Call  Date of discharge and from where:  Clayton Pena 8/28  Attempts:  2nd Attempt  Reason for unsuccessful TCM follow-up call:  No answer/busy   Clayton Pena  [facility], [facility] Guide, Phone: [phone] Website: [email or web address]

## 2023-04-13 ENCOUNTER — Other Ambulatory Visit: Payer: Self-pay | Admitting: Nurse Practitioner

## 2023-04-13 DIAGNOSIS — Z86718 Personal history of other venous thrombosis and embolism: Secondary | ICD-10-CM

## 2023-04-13 DIAGNOSIS — Z7901 Long term (current) use of anticoagulants: Secondary | ICD-10-CM

## 2023-04-13 DIAGNOSIS — R600 Localized edema: Secondary | ICD-10-CM

## 2023-04-13 DIAGNOSIS — I10 Essential (primary) hypertension: Secondary | ICD-10-CM

## 2023-04-15 ENCOUNTER — Other Ambulatory Visit: Payer: Self-pay | Admitting: Nurse Practitioner

## 2023-04-15 ENCOUNTER — Telehealth: Payer: Self-pay | Admitting: Nurse Practitioner

## 2023-04-15 DIAGNOSIS — R600 Localized edema: Secondary | ICD-10-CM

## 2023-04-15 DIAGNOSIS — Z86718 Personal history of other venous thrombosis and embolism: Secondary | ICD-10-CM

## 2023-04-15 DIAGNOSIS — Z7901 Long term (current) use of anticoagulants: Secondary | ICD-10-CM

## 2023-04-15 MED ORDER — APIXABAN 5 MG PO TABS
5.0000 mg | ORAL_TABLET | Freq: Two times a day (BID) | ORAL | 0 refills | Status: DC
Start: 2023-04-15 — End: 2023-07-04

## 2023-04-15 MED ORDER — FUROSEMIDE 20 MG PO TABS
20.0000 mg | ORAL_TABLET | Freq: Every day | ORAL | 0 refills | Status: DC
Start: 2023-04-15 — End: 2023-07-04

## 2023-04-15 MED ORDER — IRON 325 (65 FE) MG PO TABS: 1.0000 | ORAL_TABLET | Freq: Every day | ORAL | 0 refills | Status: DC

## 2023-04-15 NOTE — Telephone Encounter (Signed)
Caller & Relationship to patient:  MRN #  [id number]   Call Back Number:   Date of Last Office Visit: 04/13/2023     Date of Next Office Visit: Visit date not found    Medication(s) to be Refilled: Eliquis Water med, Iron med to be refilled   Preferred Pharmacy: [facility] in [place]  ** Please notify patient to allow 48-72 hours to process** **Let patient know to contact pharmacy at the end of the day to make sure medication is ready. ** **If patient has not been seen in a year or longer, book an appointment **Advise to use MyChart for refill requests OR to contact their pharmacy

## 2023-05-04 ENCOUNTER — Ambulatory Visit (INDEPENDENT_AMBULATORY_CARE_PROVIDER_SITE_OTHER): Payer: Medicaid Other

## 2023-05-04 VITALS — BP 134/75 | HR 72 | Temp 97.7°F | Wt 337.8 lb

## 2023-05-04 DIAGNOSIS — Z8639 Personal history of other endocrine, nutritional and metabolic disease: Secondary | ICD-10-CM | POA: Diagnosis not present

## 2023-05-04 DIAGNOSIS — I10 Essential (primary) hypertension: Secondary | ICD-10-CM | POA: Diagnosis not present

## 2023-05-04 NOTE — Progress Notes (Signed)
Blood Pressure Recheck Visit  Name: Clayton Pena MRN: [id number] Date of Birth: 1961/07/26  Clayton Pena presents today for Blood Pressure recheck with clinical support staff.     BP Readings from Last 3 Encounters:  03/23/23 (!) 176/97  09/27/22 (!) 140/82  07/04/22 (!) 162/93    Current Outpatient Medications  Medication Sig Dispense Refill   acetaminophen-codeine (TYLENOL #3) 300-30 MG tablet Take 1 tablet by mouth every 6 (six) hours as needed for moderate pain. 10 tablet 0   acetaminophen-codeine (TYLENOL #3) 300-30 MG tablet Take 1 tablet by mouth every 6 (six) hours as needed for moderate pain. 10 tablet 0   albuterol (VENTOLIN HFA) 108 (90 Base) MCG/ACT inhaler Inhale 2 puffs into the lungs every 6 (six) hours as needed for wheezing or shortness of breath. 18 g 3   apixaban (ELIQUIS) 5 MG TABS tablet Take 1 tablet (5 mg total) by mouth 2 (two) times daily. 180 tablet 0   benzonatate (TESSALON) 100 MG capsule Take 1 capsule (100 mg total) by mouth 2 (two) times daily as needed for cough. (Patient not taking: Reported on 11/13/2021) 20 capsule 0   cetirizine (ZYRTEC) 10 MG tablet Take 1 tablet (10 mg total) by mouth at bedtime. 30 tablet 11   clindamycin (CLEOCIN) 150 MG capsule Take 1 capsule (150 mg total) by mouth every 6 (six) hours. 28 capsule 0   colchicine 0.6 MG tablet Take 1 tablet (0.6 mg total) by mouth every 6 (six) hours as needed. As needed for Gouty Episodes. (Patient not taking: Reported on 11/06/2019) 30 tablet 3   cyclobenzaprine (FLEXERIL) 5 MG tablet Take 1 tablet (5 mg total) by mouth 3 (three) times daily as needed for muscle spasms. 15 tablet 0   doxycycline (VIBRAMYCIN) 100 MG capsule Take 1 capsule (100 mg total) by mouth 2 (two) times daily. One po bid x 7 days 14 capsule 0   Elastic Bandages & Supports (MEDICAL COMPRESSION STOCKINGS) MISC 1 each by Does not apply route daily. (Patient not taking: Reported on 02/12/2022) 1 each 2   Ferrous Sulfate (IRON) 325  (65 Fe) MG TABS Take 1 tablet (325 mg total) by mouth daily with breakfast. 90 tablet 0   furosemide (LASIX) 20 MG tablet Take 1 tablet (20 mg total) by mouth daily. 60 tablet 0   hydrochlorothiazide (HYDRODIURIL) 25 MG tablet Take 1 tablet by mouth once daily 60 tablet 0   ibuprofen (ADVIL) 200 MG tablet Take 600 mg by mouth every 6 (six) hours as needed for moderate pain.     metoprolol succinate (TOPROL-XL) 25 MG 24 hr tablet Take 1 tablet (25 mg total) by mouth daily. (Patient not taking: Reported on 04/27/2022) 90 tablet 1   oxyCODONE-acetaminophen (PERCOCET/ROXICET) 5-325 MG tablet Take 1 tablet by mouth every 4 (four) hours as needed. 15 tablet 0   oxyCODONE-acetaminophen (PERCOCET/ROXICET) 5-325 MG tablet Take 1 tablet by mouth every 6 (six) hours as needed for severe pain. 6 tablet 0   predniSONE (DELTASONE) 10 MG tablet Take 2 tablets (20 mg total) by mouth 2 (two) times daily. 20 tablet 0   zinc gluconate 50 MG tablet Take 1 tablet (50 mg total) by mouth daily. 30 tablet 3   No current facility-administered medications for this visit.    Hypertensive Medication Review: Patient states that they are taking all their hypertensive medications as prescribed and their last dose of hypertensive medications was  last HS     Documentation of any medication adherence  discrepancies: none  Provider Recommendation:  Spoke to [employer]  and they stated: Continue medications and follow up as planned.  Patient has been scheduled to follow up with 07/04/23   Patient has been given provider's recommendations and does not have any questions or concerns at this time. Patient will contact the office for any future questions or concerns.   Renelda Loma RMA

## 2023-05-05 LAB — COMPREHENSIVE METABOLIC PANEL
ALT: 52 [IU]/L — ABNORMAL HIGH (ref 0–44)
AST: 32 [IU]/L (ref 0–40)
Albumin: 3.9 g/dL (ref 3.9–4.9)
Alkaline Phosphatase: 55 [IU]/L (ref 44–121)
BUN/Creatinine Ratio: 19 (ref 10–24)
BUN: 14 mg/dL (ref 8–27)
Bilirubin Total: 0.6 mg/dL (ref 0.0–1.2)
CO2: 21 mmol/L (ref 20–29)
Calcium: 8.8 mg/dL (ref 8.6–10.2)
Chloride: 104 mmol/L (ref 96–106)
Creatinine, Ser: 0.72 mg/dL — ABNORMAL LOW (ref 0.76–1.27)
Globulin, Total: 2.3 g/dL (ref 1.5–4.5)
Glucose: 115 mg/dL — ABNORMAL HIGH (ref 70–99)
Potassium: 4.4 mmol/L (ref 3.5–5.2)
Sodium: 139 mmol/L (ref 134–144)
Total Protein: 6.2 g/dL (ref 6.0–8.5)
eGFR: 102 mL/min/{1.73_m2} (ref >59)

## 2023-05-05 LAB — CBC WITH DIFFERENTIAL/PLATELET
Basophils Absolute: 0.1 10*3/uL (ref 0.0–0.2)
Basos: 0 %
EOS (ABSOLUTE): 0.1 10*3/uL (ref 0.0–0.4)
Eos: 1 %
Hematocrit: 47 % (ref 37.5–51.0)
Hemoglobin: 15.5 g/dL (ref 13.0–17.7)
Immature Grans (Abs): 0.1 10*3/uL (ref 0.0–0.1)
Immature Granulocytes: 1 %
Lymphocytes Absolute: 2.4 10*3/uL (ref 0.7–3.1)
Lymphs: 17 %
MCH: 29.9 pg (ref 26.6–33.0)
MCHC: 33 g/dL (ref 31.5–35.7)
MCV: 91 fL (ref 79–97)
Monocytes Absolute: 1 10*3/uL — ABNORMAL HIGH (ref 0.1–0.9)
Monocytes: 7 %
Neutrophils Absolute: 10.2 10*3/uL — ABNORMAL HIGH (ref 1.4–7.0)
Neutrophils: 74 %
Platelets: 250 10*3/uL (ref 150–450)
RBC: 5.18 x10E6/uL (ref 4.14–5.80)
RDW: 12.6 % (ref 11.6–15.4)
WBC: 13.9 10*3/uL — ABNORMAL HIGH (ref 3.4–10.8)

## 2023-05-05 LAB — FERRITIN: Ferritin: 366 ng/mL (ref 30–400)

## 2023-07-02 ENCOUNTER — Other Ambulatory Visit: Payer: Self-pay | Admitting: Nurse Practitioner

## 2023-07-02 DIAGNOSIS — I10 Essential (primary) hypertension: Secondary | ICD-10-CM

## 2023-07-04 ENCOUNTER — Encounter: Payer: Self-pay | Admitting: Nurse Practitioner

## 2023-07-04 ENCOUNTER — Ambulatory Visit (INDEPENDENT_AMBULATORY_CARE_PROVIDER_SITE_OTHER): Payer: Medicaid Other | Admitting: Nurse Practitioner

## 2023-07-04 VITALS — BP 158/84 | HR 76 | Wt 328.4 lb

## 2023-07-04 DIAGNOSIS — Z7901 Long term (current) use of anticoagulants: Secondary | ICD-10-CM | POA: Diagnosis not present

## 2023-07-04 DIAGNOSIS — I10 Essential (primary) hypertension: Secondary | ICD-10-CM

## 2023-07-04 DIAGNOSIS — Z1329 Encounter for screening for other suspected endocrine disorder: Secondary | ICD-10-CM | POA: Diagnosis not present

## 2023-07-04 DIAGNOSIS — Z86718 Personal history of other venous thrombosis and embolism: Secondary | ICD-10-CM | POA: Diagnosis not present

## 2023-07-04 DIAGNOSIS — R600 Localized edema: Secondary | ICD-10-CM

## 2023-07-04 DIAGNOSIS — R1033 Periumbilical pain: Secondary | ICD-10-CM | POA: Diagnosis not present

## 2023-07-04 DIAGNOSIS — Z9889 Other specified postprocedural states: Secondary | ICD-10-CM

## 2023-07-04 DIAGNOSIS — Z13220 Encounter for screening for lipoid disorders: Secondary | ICD-10-CM | POA: Diagnosis not present

## 2023-07-04 DIAGNOSIS — Z8719 Personal history of other diseases of the digestive system: Secondary | ICD-10-CM

## 2023-07-04 MED ORDER — CIPROFLOXACIN HCL 500 MG PO TABS: 500.0000 mg | ORAL_TABLET | Freq: Two times a day (BID) | ORAL | 0 refills | Status: AC

## 2023-07-04 MED ORDER — FUROSEMIDE 20 MG PO TABS
20.0000 mg | ORAL_TABLET | Freq: Every day | ORAL | 0 refills | Status: DC
Start: 2023-07-04 — End: 2023-09-26

## 2023-07-04 MED ORDER — HYDROCHLOROTHIAZIDE 25 MG PO TABS
25.0000 mg | ORAL_TABLET | Freq: Every day | ORAL | 0 refills | Status: DC
Start: 2023-07-04 — End: 2023-09-01

## 2023-07-04 MED ORDER — APIXABAN 5 MG PO TABS
5.0000 mg | ORAL_TABLET | Freq: Two times a day (BID) | ORAL | 0 refills | Status: DC
Start: 2023-07-04 — End: 2023-09-01

## 2023-07-04 MED ORDER — DOXYCYCLINE HYCLATE 100 MG PO TABS: 100.0000 mg | ORAL_TABLET | Freq: Two times a day (BID) | ORAL | 0 refills | Status: AC

## 2023-07-04 NOTE — Patient Instructions (Addendum)
1. Localized edema  - furosemide (LASIX) 20 MG tablet; Take 1 tablet (20 mg total) by mouth daily.  Dispense: 60 tablet; Refill: 0  2. Essential hypertension  - hydrochlorothiazide (HYDRODIURIL) 25 MG tablet; Take 1 tablet (25 mg total) by mouth daily.  Dispense: 60 tablet; Refill: 0 - CBC - Comprehensive metabolic panel  3. Chronic anticoagulation  - apixaban (ELIQUIS) 5 MG TABS tablet; Take 1 tablet (5 mg total) by mouth 2 (two) times daily.  Dispense: 180 tablet; Refill: 0  4. History of DVT (deep vein thrombosis)  - apixaban (ELIQUIS) 5 MG TABS tablet; Take 1 tablet (5 mg total) by mouth 2 (two) times daily.  Dispense: 180 tablet; Refill: 0  5. Periumbilical abdominal pain  - Ambulatory referral to General Surgery  6. History of hernia repair  - Ambulatory referral to General Surgery  7. Lipid screening  - Lipid Panel  8. Thyroid disorder screen  - TSH  Follow up:  Follow up in 6 months

## 2023-07-04 NOTE — Progress Notes (Signed)
Subjective   Patient ID: Clayton Pena, male    DOB: 1960-09-21, 62 y.o.   MRN: [id number]  Chief Complaint  Patient presents with   Annual Exam   Medication Refill    Referring provider: Ivonne Andrew, NP  Jasan Potempa is a 62 y.o. male with Past Medical History: 05/2019: Acute suppr otitis media w/o spon rupt ear drum, right ear No date: Angina at rest [facility]) No date: Coronary artery disease No date: Hypertension   HPI  Patient presents today for physical.  He does need blood work today.  He states that he is traveling out of town to visit family in [place] this month.  He would like to get a prescription for Cipro and doxycycline in case he needs it when he is overseas.  He has been out of blood pressure medicine for the past 3 days.  Blood pressure was elevated in office today.  We will refill meds today. Denies f/c/s, n/v/d, hemoptysis, PND, leg swelling Denies chest pain or edema    Allergies  Allergen Reactions   Penicillins Anaphylaxis    Has patient had a PCN reaction causing immediate rash, facial/tongue/throat swelling, SOB or lightheadedness with hypotension: unknown Has patient had a PCN reaction causing severe rash involving mucus membranes or skin necrosis: unknown Has patient had a PCN reaction that required hospitalization: unknown Has patient had a PCN reaction occurring within the last 10 years: no If all of the above answers are "NO", then may proceed with Cephalosporin use.    Demerol  [Meperidine Hcl] Other (See Comments)    REACTION:  Increases blood pressure   Demerol [Meperidine] Other (See Comments)    REACTION:  Increases blood pressure    Immunization History  Administered Date(s) Administered   Influenza,inj,Quad PF,6+ Mos 06/15/2016, 05/25/2017, 05/16/2019   Tdap 02/22/2018    Tobacco History: Social History   Tobacco Use  Smoking Status Never  Smokeless Tobacco Never   Counseling given: Not Answered   Outpatient Encounter  Medications as of 07/04/2023  Medication Sig   acetaminophen-codeine (TYLENOL #3) 300-30 MG tablet Take 1 tablet by mouth every 6 (six) hours as needed for moderate pain.   albuterol (VENTOLIN HFA) 108 (90 Base) MCG/ACT inhaler Inhale 2 puffs into the lungs every 6 (six) hours as needed for wheezing or shortness of breath.   benzonatate (TESSALON) 100 MG capsule Take 1 capsule (100 mg total) by mouth 2 (two) times daily as needed for cough.   cetirizine (ZYRTEC) 10 MG tablet Take 1 tablet (10 mg total) by mouth at bedtime.   ciprofloxacin (CIPRO) 500 MG tablet Take 1 tablet (500 mg total) by mouth 2 (two) times daily for 10 days.   clindamycin (CLEOCIN) 150 MG capsule Take 1 capsule (150 mg total) by mouth every 6 (six) hours.   colchicine 0.6 MG tablet Take 1 tablet (0.6 mg total) by mouth every 6 (six) hours as needed. As needed for Gouty Episodes.   cyclobenzaprine (FLEXERIL) 5 MG tablet Take 1 tablet (5 mg total) by mouth 3 (three) times daily as needed for muscle spasms.   doxycycline (VIBRA-TABS) 100 MG tablet Take 1 tablet (100 mg total) by mouth 2 (two) times daily for 10 days.   Elastic Bandages & Supports (MEDICAL COMPRESSION STOCKINGS) MISC 1 each by Does not apply route daily.   Ferrous Sulfate (IRON) 325 (65 Fe) MG TABS Take 1 tablet (325 mg total) by mouth daily with breakfast.   ibuprofen (ADVIL) 200 MG tablet Take  600 mg by mouth every 6 (six) hours as needed for moderate pain.   metoprolol succinate (TOPROL-XL) 25 MG 24 hr tablet Take 1 tablet (25 mg total) by mouth daily.   oxyCODONE-acetaminophen (PERCOCET/ROXICET) 5-325 MG tablet Take 1 tablet by mouth every 4 (four) hours as needed.   oxyCODONE-acetaminophen (PERCOCET/ROXICET) 5-325 MG tablet Take 1 tablet by mouth every 6 (six) hours as needed for severe pain.   predniSONE (DELTASONE) 10 MG tablet Take 2 tablets (20 mg total) by mouth 2 (two) times daily.   zinc gluconate 50 MG tablet Take 1 tablet (50 mg total) by mouth daily.    [DISCONTINUED] apixaban (ELIQUIS) 5 MG TABS tablet Take 1 tablet (5 mg total) by mouth 2 (two) times daily.   [DISCONTINUED] furosemide (LASIX) 20 MG tablet Take 1 tablet (20 mg total) by mouth daily.   [DISCONTINUED] hydrochlorothiazide (HYDRODIURIL) 25 MG tablet Take 1 tablet by mouth once daily   apixaban (ELIQUIS) 5 MG TABS tablet Take 1 tablet (5 mg total) by mouth 2 (two) times daily.   furosemide (LASIX) 20 MG tablet Take 1 tablet (20 mg total) by mouth daily.   hydrochlorothiazide (HYDRODIURIL) 25 MG tablet Take 1 tablet (25 mg total) by mouth daily.   No facility-administered encounter medications on file as of 07/04/2023.    Review of Systems  Review of Systems  Constitutional: Negative.   HENT: Negative.    Cardiovascular: Negative.   Gastrointestinal: Negative.   Allergic/Immunologic: Negative.   Neurological: Negative.   Psychiatric/Behavioral: Negative.       Objective:   BP (!) 158/84   Pulse 76   Wt (!) 328 lb 6.4 oz (149 kg)   SpO2 98%   BMI 43.33 kg/m   Wt Readings from Last 5 Encounters:  07/04/23 (!) 328 lb 6.4 oz (149 kg)  05/04/23 (!) 337 lb 12.8 oz (153.2 kg)  03/23/23 (!) 338 lb 9.6 oz (153.6 kg)  09/27/22 (!) 324 lb (147 kg)  07/04/22 (!) 341 lb 11.4 oz (155 kg)     Physical Exam Vitals and nursing note reviewed.  Constitutional:      General: He is not in acute distress.    Appearance: He is well-developed.  Cardiovascular:     Rate and Rhythm: Normal rate and regular rhythm.  Pulmonary:     Effort: Pulmonary effort is normal.     Breath sounds: Normal breath sounds.  Skin:    General: Skin is warm and dry.  Neurological:     Mental Status: He is alert and oriented to person, place, and time.       Assessment & Plan:   Periumbilical abdominal pain -     Ambulatory referral to General Surgery  Localized edema -     Furosemide; Take 1 tablet (20 mg total) by mouth daily.  Dispense: 60 tablet; Refill: 0  Essential  hypertension -     hydroCHLOROthiazide; Take 1 tablet (25 mg total) by mouth daily.  Dispense: 60 tablet; Refill: 0 -     CBC -     Comprehensive metabolic panel  Chronic anticoagulation -     Apixaban; Take 1 tablet (5 mg total) by mouth 2 (two) times daily.  Dispense: 180 tablet; Refill: 0  History of DVT (deep vein thrombosis) -     Apixaban; Take 1 tablet (5 mg total) by mouth 2 (two) times daily.  Dispense: 180 tablet; Refill: 0  History of hernia repair -     Ambulatory referral to  General Surgery  Lipid screening -     Lipid panel  Thyroid disorder screen -     TSH  Other orders -     Ciprofloxacin HCl; Take 1 tablet (500 mg total) by mouth 2 (two) times daily for 10 days.  Dispense: 20 tablet; Refill: 0 -     Doxycycline Hyclate; Take 1 tablet (100 mg total) by mouth 2 (two) times daily for 10 days.  Dispense: 20 tablet; Refill: 0     Return in about 6 months (around 01/02/2024).   Ivonne Andrew, NP 07/04/2023

## 2023-07-05 ENCOUNTER — Other Ambulatory Visit: Payer: Self-pay | Admitting: Nurse Practitioner

## 2023-07-05 DIAGNOSIS — D72829 Elevated white blood cell count, unspecified: Secondary | ICD-10-CM

## 2023-07-05 LAB — COMPREHENSIVE METABOLIC PANEL
ALT: 27 [IU]/L (ref 0–44)
AST: 22 [IU]/L (ref 0–40)
Albumin: 3.8 g/dL — ABNORMAL LOW (ref 3.9–4.9)
Alkaline Phosphatase: 51 [IU]/L (ref 44–121)
BUN/Creatinine Ratio: 17 (ref 10–24)
BUN: 15 mg/dL (ref 8–27)
Bilirubin Total: 0.4 mg/dL (ref 0.0–1.2)
CO2: 20 mmol/L (ref 20–29)
Calcium: 8.7 mg/dL (ref 8.6–10.2)
Chloride: 105 mmol/L (ref 96–106)
Creatinine, Ser: 0.89 mg/dL (ref 0.76–1.27)
Globulin, Total: 2.3 g/dL (ref 1.5–4.5)
Glucose: 142 mg/dL — ABNORMAL HIGH (ref 70–99)
Potassium: 4.2 mmol/L (ref 3.5–5.2)
Sodium: 139 mmol/L (ref 134–144)
Total Protein: 6.1 g/dL (ref 6.0–8.5)
eGFR: 96 mL/min/{1.73_m2} (ref >59)

## 2023-07-05 LAB — LIPID PANEL
Chol/HDL Ratio: 3.1 {ratio} (ref 0.0–5.0)
Cholesterol, Total: 113 mg/dL (ref 100–199)
HDL: 37 mg/dL — ABNORMAL LOW (ref >39)
LDL Chol Calc (NIH): 61 mg/dL (ref 0–99)
Triglycerides: 75 mg/dL (ref 0–149)
VLDL Cholesterol Cal: 15 mg/dL (ref 5–40)

## 2023-07-05 LAB — CBC
Hematocrit: 44.8 % (ref 37.5–51.0)
Hemoglobin: 14.6 g/dL (ref 13.0–17.7)
MCH: 29.5 pg (ref 26.6–33.0)
MCHC: 32.6 g/dL (ref 31.5–35.7)
MCV: 91 fL (ref 79–97)
Platelets: 256 10*3/uL (ref 150–450)
RBC: 4.95 x10E6/uL (ref 4.14–5.80)
RDW: 12.3 % (ref 11.6–15.4)
WBC: 16.3 10*3/uL — ABNORMAL HIGH (ref 3.4–10.8)

## 2023-07-05 LAB — TSH: TSH: 2.01 u[IU]/mL (ref 0.450–4.500)

## 2023-07-07 ENCOUNTER — Ambulatory Visit: Payer: Medicaid Other | Admitting: General Surgery

## 2023-07-18 DIAGNOSIS — D72829 Elevated white blood cell count, unspecified: Secondary | ICD-10-CM | POA: Insufficient documentation
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
## 2023-07-19 ENCOUNTER — Inpatient Hospital Stay: Payer: Medicaid Other | Attending: Hematology | Admitting: Hematology

## 2023-07-19 ENCOUNTER — Inpatient Hospital Stay: Payer: Medicaid Other

## 2023-07-19 ENCOUNTER — Encounter: Payer: Self-pay | Admitting: Hematology

## 2023-07-19 VITALS — BP 140/78 | HR 77 | Temp 97.6°F | Resp 18 | Ht 72.0 in | Wt 327.0 lb

## 2023-07-19 DIAGNOSIS — D72825 Bandemia: Secondary | ICD-10-CM

## 2023-07-19 DIAGNOSIS — D72828 Other elevated white blood cell count: Secondary | ICD-10-CM | POA: Diagnosis not present

## 2023-07-19 LAB — CBC WITH DIFFERENTIAL/PLATELET
Abs Immature Granulocytes: 0.17 10*3/uL — ABNORMAL HIGH (ref 0.00–0.07)
Basophils Absolute: 0.1 10*3/uL (ref 0.0–0.1)
Basophils Relative: 1 %
Eosinophils Absolute: 0 10*3/uL (ref 0.0–0.5)
Eosinophils Relative: 0 %
HCT: 45.3 % (ref 39.0–52.0)
Hemoglobin: 15.3 g/dL (ref 13.0–17.0)
Immature Granulocytes: 1 %
Lymphocytes Relative: 16 %
Lymphs Abs: 2.3 10*3/uL (ref 0.7–4.0)
MCH: 30.1 pg (ref 26.0–34.0)
MCHC: 33.8 g/dL (ref 30.0–36.0)
MCV: 89.2 fL (ref 80.0–100.0)
Monocytes Absolute: 0.9 10*3/uL (ref 0.1–1.0)
Monocytes Relative: 6 %
Neutro Abs: 10.8 10*3/uL — ABNORMAL HIGH (ref 1.7–7.7)
Neutrophils Relative %: 76 %
Platelets: 214 10*3/uL (ref 150–400)
RBC: 5.08 MIL/uL (ref 4.22–5.81)
RDW: 13.2 % (ref 11.5–15.5)
WBC: 14.3 10*3/uL — ABNORMAL HIGH (ref 4.0–10.5)
nRBC: 0 % (ref 0.0–0.2)

## 2023-07-19 LAB — SEDIMENTATION RATE: Sed Rate: 24 mm/h — ABNORMAL HIGH (ref 0–16)

## 2023-07-19 LAB — C-REACTIVE PROTEIN: CRP: 0.6 mg/dL (ref <1.0)

## 2023-07-19 LAB — LACTATE DEHYDROGENASE: LDH: 152 U/L (ref 98–192)

## 2023-07-19 NOTE — Patient Instructions (Signed)
You were seen and examined today by Dr. Ellin Saba. Dr. Ellin Saba is a hematologist, meaning that he specializes in blood abnormalities. Dr. Ellin Saba discussed your past medical history, family history of cancers/blood conditions and the events that led to you being here today.  You were referred to Dr. Ellin Saba due to leukocytosis (elevated white blood cell count).  Dr. Ellin Saba has recommended additional labs today for further evaluation.  Follow-up as scheduled.

## 2023-07-26 LAB — BCR-ABL1 FISH
Cells Analyzed: 200
Cells Counted: 200

## 2023-07-28 LAB — JAK2 V617F RFX CALR/MPL/E12-15

## 2023-07-28 LAB — CALR +MPL + E12-E15  (REFLEX)

## 2023-08-25 DIAGNOSIS — Z7901 Long term (current) use of anticoagulants: Secondary | ICD-10-CM

## 2023-08-25 DIAGNOSIS — I10 Essential (primary) hypertension: Secondary | ICD-10-CM

## 2023-08-25 DIAGNOSIS — Z86718 Personal history of other venous thrombosis and embolism: Secondary | ICD-10-CM
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
## 2023-08-26 NOTE — Telephone Encounter (Signed)
Printed results for pt pick up. KH

## 2023-08-29 ENCOUNTER — Inpatient Hospital Stay: Payer: Medicaid Other | Attending: Physician Assistant | Admitting: Physician Assistant

## 2023-08-29 VITALS — BP 172/84 | HR 83 | Temp 96.5°F | Resp 20 | Wt 338.0 lb

## 2023-08-29 DIAGNOSIS — D72828 Other elevated white blood cell count: Secondary | ICD-10-CM | POA: Diagnosis not present

## 2023-08-29 DIAGNOSIS — I2510 Atherosclerotic heart disease of native coronary artery without angina pectoris: Secondary | ICD-10-CM | POA: Insufficient documentation

## 2023-08-29 DIAGNOSIS — R700 Elevated erythrocyte sedimentation rate: Secondary | ICD-10-CM | POA: Insufficient documentation

## 2023-08-29 DIAGNOSIS — Z806 Family history of leukemia: Secondary | ICD-10-CM | POA: Insufficient documentation

## 2023-08-29 DIAGNOSIS — Z801 Family history of malignant neoplasm of trachea, bronchus and lung: Secondary | ICD-10-CM | POA: Insufficient documentation

## 2023-08-29 DIAGNOSIS — Z7901 Long term (current) use of anticoagulants: Secondary | ICD-10-CM | POA: Diagnosis not present

## 2023-08-29 DIAGNOSIS — D72825 Bandemia: Secondary | ICD-10-CM | POA: Diagnosis not present

## 2023-08-29 DIAGNOSIS — Z86718 Personal history of other venous thrombosis and embolism: Secondary | ICD-10-CM | POA: Insufficient documentation

## 2023-08-29 DIAGNOSIS — Z8042 Family history of malignant neoplasm of prostate: Secondary | ICD-10-CM | POA: Diagnosis not present

## 2023-08-29 DIAGNOSIS — I10 Essential (primary) hypertension: Secondary | ICD-10-CM | POA: Diagnosis not present

## 2023-08-29 DIAGNOSIS — Z800 Family history of malignant neoplasm of digestive organs: Secondary | ICD-10-CM | POA: Diagnosis not present

## 2023-08-29 NOTE — Patient Instructions (Signed)
[facility] at [facility] **VISIT SUMMARY & IMPORTANT INSTRUCTIONS **   You were seen today by Rojelio Brenner PA-C for your elevated white blood cells.   Your labs did not show any evidence of genetic mutations or white blood cell cancer. Your elevated white blood cells are most likely "reactive" in the setting of chronic inflammation and obesity. We will check your labs again in 6 months, and we will plan on seeing you for an office visit and lab check in 1 year. If you would like [place] to check your blood count following your hernia repair surgery, please call our office about 2 months after your surgery so that we can bring you in for labs.  (I recommend waiting at least 2 months after surgery to allow for postsurgical inflammation to calm down.) If you notice any new or concerning symptoms such as drenching night sweats, unexplained fever, shaking chills in the absence of infection, or unexplained weight loss, please contact our office for further evaluation.  FOLLOW-UP APPOINTMENT: 1 year  ** Thank you for trusting me with your healthcare!  I strive to provide all of my patients with quality care at each visit.  If you receive a survey for this visit, I would be so grateful to you for taking the time to provide feedback.  Thank you in advance!  ~ Robina Hamor                   Dr. Doreatha Massed   &   Rojelio Brenner, PA-C   - - - - - - - - - - - - - - - - - -    Thank you for choosing [facility] at [facility] to provide your oncology and hematology care.  To afford each patient quality time with our provider, please arrive at least 15 minutes before your scheduled appointment time.   If you have a lab appointment with the Cancer Center please come in thru the Main Entrance and check in at the main information desk.  You need to re-schedule your appointment should you arrive 10 or more minutes late.  We strive to give you quality time  with our providers, and arriving late affects you and other patients whose appointments are after yours.  Also, if you no show three or more times for appointments you may be dismissed from the clinic at the providers discretion.     Again, thank you for choosing [facility]  Our hope is that these requests will decrease the amount of time that you wait before being seen by our physicians.       _____________________________________________________________  Should you have questions after your visit to [facility], please contact our office at [phone] and follow the prompts.  Our office hours are 8:00 a.m. and 4:30 p.m. Monday - Friday.  Please note that voicemails left after 4:00 p.m. may not be returned until the following business day.  We are closed weekends and major holidays.  You do have access to a nurse 24-7, just call the main number to the clinic [phone] and do not press any options, hold on the line and a nurse will answer the phone.    For prescription refill requests, have your pharmacy contact our office and allow 72 hours.

## 2023-09-01 ENCOUNTER — Encounter: Payer: Self-pay | Admitting: Surgery

## 2023-09-01 ENCOUNTER — Ambulatory Visit: Payer: Medicaid Other | Admitting: Surgery

## 2023-09-01 VITALS — BP 162/93 | HR 75 | Temp 98.1°F | Resp 18 | Ht 72.0 in | Wt 338.0 lb

## 2023-09-01 DIAGNOSIS — R1084 Generalized abdominal pain: Secondary | ICD-10-CM | POA: Diagnosis not present

## 2023-09-01 MED ORDER — HYDROCHLOROTHIAZIDE 25 MG PO TABS: 25.0000 mg | ORAL_TABLET | Freq: Every day | ORAL | 0 refills | Status: DC

## 2023-09-01 MED ORDER — APIXABAN 5 MG PO TABS
5.0000 mg | ORAL_TABLET | Freq: Two times a day (BID) | ORAL | 0 refills | Status: AC
Start: 2023-09-01 — End: ?

## 2023-09-01 MED ORDER — IRON 325 (65 FE) MG PO TABS: 1.0000 | ORAL_TABLET | Freq: Every day | ORAL | 0 refills | Status: DC

## 2023-09-01 MED ORDER — METOPROLOL SUCCINATE ER 25 MG PO TB24: 25.0000 mg | ORAL_TABLET | Freq: Every day | ORAL | 1 refills | Status: DC

## 2023-09-02 NOTE — Progress Notes (Signed)
 [facility] History and Physical  Reason for Referral: Umbilical pain status post umbilical hernia repair at [facility] in 2019 Referring Physician: Bascom Borer, NP  Chief Complaint   New Patient (Initial Visit)     Clayton Pena is a 63 y.o. male.  HPI: Patient presents for evaluation of right-sided abdominal pain.  He underwent a laparoscopic assisted open ventral hernia repair with mesh for an umbilical hernia in 2019.  This was done at [facility].  In February 2024, he had a severe fall and since that time, he has been having worsening right-sided abdominal pain.  The pain is worse with movement specifically when turning his abdomen.  He is concerned that the mesh has moved positions inside of his abdomen.  He denies any specific bulges in the area.  He denies nausea and vomiting, and is having regular bowel movements.  His past medical history is significant for hypertension and coronary artery disease status post 3 MIs.  He has had multiple cardiac catheterizations without any stents placed.  His most recent MI was in 2011.  He was previously on Eliquis  but has been off of his Eliquis  since September.  He denies any other history of abdominal surgeries other than his hernia surgery.  He will occasionally drink alcohol.  He denies use of tobacco products and illicit drugs.  Past Medical History:  Diagnosis Date   Acute suppr otitis media w/o spon rupt ear drum, right ear 05/2019   Angina at rest [facility])    Coronary artery disease    Hypertension     Past Surgical History:  Procedure Laterality Date   BACK SURGERY     CORONARY ANGIOPLASTY     HERNIA REPAIR      Family History  Problem Relation Age of Onset   Hypertension Mother    CAD Mother    Diabetes Mother    Hypertension Father    CAD Father    Prostate cancer Father    Diabetes Brother    Stroke Neg Hx     Social History   Tobacco Use   Smoking status: Never   Smokeless tobacco: Never   Vaping Use   Vaping status: Never Used  Substance Use Topics   Alcohol use: No    Comment: occasional   Drug use: No    Medications: I have reviewed the patient's current medications. Allergies as of 09/01/2023       Reactions   Penicillins Anaphylaxis   Has patient had a PCN reaction causing immediate rash, facial/tongue/throat swelling, SOB or lightheadedness with hypotension: unknown Has patient had a PCN reaction causing severe rash involving mucus membranes or skin necrosis: unknown Has patient had a PCN reaction that required hospitalization: unknown Has patient had a PCN reaction occurring within the last 10 years: no If all of the above answers are NO, then may proceed with Cephalosporin use.   Demerol  [meperidine Hcl] Other (See Comments)   REACTION:  Increases blood pressure   Demerol [meperidine] Other (See Comments)   REACTION:  Increases blood pressure        Medication List        Accurate as of September 01, 2023 11:59 PM. If you have any questions, ask your nurse or doctor.          STOP taking these medications    benzonatate  100 MG capsule Commonly known as: TESSALON  Stopped by: Keneisha Heckart A Aria Jarrard   clindamycin  150 MG capsule Commonly known as: CLEOCIN  Stopped  by: Katharin Schneider A Fareeha Evon   colchicine  0.6 MG tablet Stopped by: Pecolia Marando A Keylani Perlstein   metoprolol  succinate 25 MG 24 hr tablet Commonly known as: TOPROL -XL Stopped by: Yobana Culliton A Keng Jewel   oxyCODONE -acetaminophen  5-325 MG tablet Commonly known as: PERCOCET/ROXICET Stopped by: Kaylie Ritter A Vanya Carberry   predniSONE  10 MG tablet Commonly known as: DELTASONE  Stopped by: Nethra Mehlberg A Valentine Barney       TAKE these medications    acetaminophen -codeine  300-30 MG tablet Commonly known as: TYLENOL  #3 Take 1 tablet by mouth every 6 (six) hours as needed for moderate pain.   albuterol  108 (90 Base) MCG/ACT inhaler Commonly known as: VENTOLIN  HFA Inhale 2 puffs into the lungs  every 6 (six) hours as needed for wheezing or shortness of breath.   apixaban  5 MG Tabs tablet Commonly known as: Eliquis  Take 1 tablet (5 mg total) by mouth 2 (two) times daily.   APPLE CIDER VINEGAR PO Take by mouth.   cetirizine  10 MG tablet Commonly known as: ZYRTEC  Take 1 tablet (10 mg total) by mouth at bedtime.   cyclobenzaprine  5 MG tablet Commonly known as: FLEXERIL  Take 1 tablet (5 mg total) by mouth 3 (three) times daily as needed for muscle spasms.   furosemide  20 MG tablet Commonly known as: LASIX  Take 1 tablet (20 mg total) by mouth daily.   hydrochlorothiazide  25 MG tablet Commonly known as: HYDRODIURIL  Take 1 tablet (25 mg total) by mouth daily. What changed: Another medication with the same name was removed. Continue taking this medication, and follow the directions you see here. Changed by: Josten Warmuth A Gia Lusher   ibuprofen  200 MG tablet Commonly known as: ADVIL  Take 600 mg by mouth every 6 (six) hours as needed for moderate pain.   Iron  325 (65 Fe) MG Tabs Take 1 tablet (325 mg total) by mouth daily with breakfast.   MAGNESIUM PO Take by mouth.   Medical Compression Stockings Misc 1 each by Does not apply route daily.   VITAMIN C PO Take by mouth.   zinc  gluconate 50 MG tablet Take 1 tablet (50 mg total) by mouth daily.         ROS:  Constitutional: negative for chills, fatigue, and fevers Eyes: negative for visual disturbance and pain Ears, nose, mouth, throat, and face: negative for ear drainage, sore throat, and sinus problems Respiratory: positive for cough, negative for wheezing and shortness of breath Cardiovascular: negative for chest pain and palpitations Gastrointestinal: positive for abdominal pain, negative for nausea, reflux symptoms, and vomiting Genitourinary:positive for frequency, negative for dysuria Integument/breast: negative for dryness and rash Hematologic/lymphatic: negative for bleeding and  lymphadenopathy Musculoskeletal:negative for back pain and neck pain Neurological: negative for dizziness and tremors Endocrine: negative for temperature intolerance  Blood pressure (!) 162/93, pulse 75, temperature 98.1 F (36.7 C), temperature source Oral, resp. rate 18, height 6' (1.829 m), weight (!) 338 lb (153.3 kg), SpO2 95%. Physical Exam Vitals reviewed.  Constitutional:      Appearance: Normal appearance. He is obese.  HENT:     Head: Normocephalic and atraumatic.  Eyes:     Extraocular Movements: Extraocular movements intact.     Pupils: Pupils are equal, round, and reactive to light.  Cardiovascular:     Rate and Rhythm: Normal rate and regular rhythm.  Pulmonary:     Effort: Pulmonary effort is normal.     Breath sounds: Normal breath sounds.  Abdominal:     Comments: Abdomen soft, nondistended, no percussion tenderness, mild tenderness to palpation in  right side of abdomen; no rigidity, guarding, rebound tenderness; fullness around umbilicus at previous surgical site, no discrete hernias palpated anywhere within the abdomen; diastasis recti also noted on examination  Musculoskeletal:        General: Normal range of motion.     Cervical back: Normal range of motion.  Skin:    General: Skin is warm and dry.  Neurological:     General: No focal deficit present.     Mental Status: He is alert and oriented to person, place, and time.  Psychiatric:        Mood and Affect: Mood normal.        Behavior: Behavior normal.     Results: No results found for this or any previous visit (from the past 48 hours).  No results found.   Assessment & Plan:  Jayron Maqueda is a 63 y.o. male who presents for evaluation of right sided abdominal pain.  -I discussed with the patient that I do not feel any hernia defects.  The patient's body habitus also would make it very difficult to palpate a possible hernia defect.  I would also not be able to feel any issues with his mesh from an  external abdominal exam.  For this reason, we will up we will obtain a CT of the abdomen and pelvis to evaluate for hernia or other acute intra-abdominal pathology -CT of the abdomen and pelvis has been ordered -I also discussed that the patient has diastasis recti present, and this is not a recurrence of his umbilical hernia -I will plan to call the patient once CT of the abdomen and pelvis has been obtained  All questions were answered to the satisfaction of the patient.  Dorothyann Brittle, DO [facility] [address] [place], [place] [address] [phone] (office)

## 2023-09-07 ENCOUNTER — Other Ambulatory Visit: Payer: Self-pay | Admitting: Nurse Practitioner

## 2023-09-08 ENCOUNTER — Ambulatory Visit (HOSPITAL_COMMUNITY)
Admission: RE | Admit: 2023-09-08 | Discharge: 2023-09-08 | Disposition: A | Discharge status: Home / Self Care | Payer: Medicaid Other | Source: Ambulatory Visit | Attending: Surgery | Admitting: Surgery

## 2023-09-08 DIAGNOSIS — D3502 Benign neoplasm of left adrenal gland: Secondary | ICD-10-CM | POA: Diagnosis not present

## 2023-09-08 DIAGNOSIS — K7689 Other specified diseases of liver: Secondary | ICD-10-CM | POA: Diagnosis not present

## 2023-09-08 DIAGNOSIS — R1084 Generalized abdominal pain: Secondary | ICD-10-CM | POA: Insufficient documentation

## 2023-09-08 DIAGNOSIS — K760 Fatty (change of) liver, not elsewhere classified: Secondary | ICD-10-CM | POA: Diagnosis not present

## 2023-09-08 DIAGNOSIS — K429 Umbilical hernia without obstruction or gangrene: Secondary | ICD-10-CM | POA: Diagnosis not present

## 2023-09-08 MED ORDER — IOHEXOL 350 MG/ML SOLN
75.0000 mL | Freq: Once | INTRAVENOUS | Status: AC | PRN
  Administered 2023-09-08: 75 mL via INTRAVENOUS

## 2023-09-12 ENCOUNTER — Telehealth (INDEPENDENT_AMBULATORY_CARE_PROVIDER_SITE_OTHER): Payer: Self-pay | Admitting: Surgery

## 2023-09-12 DIAGNOSIS — R1084 Generalized abdominal pain: Secondary | ICD-10-CM | POA: Diagnosis not present

## 2023-09-12 NOTE — Telephone Encounter (Signed)
[facility]  Called to update the patient regarding his recent CT scan.  The CT does not demonstrate any recurrent hernias, and there is no evidence that his previous mesh has moved to a different location within his abdomen.  There was evidence of a possible area of fat necrosis on the left side of his abdomen, but given his abdominal pain is on the right, I believe this is an incidental finding.  Expressed to the patient that without any evidence of his hernia recurrence, there is no indication for any surgical interventions.  Imaging was reviewed by myself.  Patient is understanding and all questions were answered to his expressed satisfaction.  CT of the abdomen pelvis (09/08/2023): IMPRESSION: 1. Prior umbilical hernia without evidence of local recurrence. 2. Well-circumscribed centrally macroscopic fat containing 2.7 cm lesion in the left anterior abdomen is compatible with a self-limiting benign focus of fat necrosis which can be a cause of abdominal pain. 3. Diffuse hepatic steatosis. 4. 2.6 cm benign left adrenal adenoma requiring no independent imaging follow-up.  Clayton Kinds, DO [facility] [address] [place] [place], [place] [address] [phone] (office)

## 2023-09-26 ENCOUNTER — Other Ambulatory Visit: Payer: Self-pay | Admitting: Nurse Practitioner

## 2023-09-26 DIAGNOSIS — R600 Localized edema: Secondary | ICD-10-CM

## 2023-10-17 ENCOUNTER — Other Ambulatory Visit: Payer: Self-pay | Admitting: Nurse Practitioner

## 2023-10-17 DIAGNOSIS — R600 Localized edema: Secondary | ICD-10-CM

## 2023-10-17 MED ORDER — FUROSEMIDE 20 MG PO TABS: 20.0000 mg | ORAL_TABLET | Freq: Every day | ORAL | 1 refills | Status: DC

## 2023-11-16 ENCOUNTER — Encounter (HOSPITAL_COMMUNITY): Payer: Self-pay

## 2023-11-16 ENCOUNTER — Emergency Department (HOSPITAL_COMMUNITY)

## 2023-11-16 ENCOUNTER — Other Ambulatory Visit: Payer: Self-pay

## 2023-11-16 ENCOUNTER — Emergency Department (HOSPITAL_COMMUNITY)
Admission: EM | Admit: 2023-11-16 | Discharge: 2023-11-16 | Disposition: A | Discharge status: Home / Self Care | Attending: Emergency Medicine | Admitting: Emergency Medicine

## 2023-11-16 DIAGNOSIS — I2510 Atherosclerotic heart disease of native coronary artery without angina pectoris: Secondary | ICD-10-CM | POA: Diagnosis not present

## 2023-11-16 DIAGNOSIS — Z7901 Long term (current) use of anticoagulants: Secondary | ICD-10-CM | POA: Insufficient documentation

## 2023-11-16 DIAGNOSIS — M79672 Pain in left foot: Secondary | ICD-10-CM | POA: Diagnosis not present

## 2023-11-16 DIAGNOSIS — M79605 Pain in left leg: Secondary | ICD-10-CM | POA: Insufficient documentation

## 2023-11-16 DIAGNOSIS — M7989 Other specified soft tissue disorders: Secondary | ICD-10-CM | POA: Insufficient documentation

## 2023-11-16 DIAGNOSIS — L03116 Cellulitis of left lower limb: Secondary | ICD-10-CM

## 2023-11-16 HISTORY — DX: Acute embolism and thrombosis of unspecified deep veins of unspecified lower extremity: I82.409

## 2023-11-16 HISTORY — DX: Acute myocardial infarction, unspecified: I21.9

## 2023-11-16 MED ORDER — CEFDINIR 300 MG PO CAPS: 300.0000 mg | ORAL_CAPSULE | Freq: Two times a day (BID) | ORAL | 0 refills | Status: DC

## 2023-11-16 MED ORDER — CEFTRIAXONE SODIUM 1 G IJ SOLR
1.0000 g | Freq: Once | INTRAMUSCULAR | Status: AC
  Administered 2023-11-16: 1 g via INTRAMUSCULAR
  Filled 2023-11-16: qty 10

## 2023-11-16 MED ORDER — STERILE WATER FOR INJECTION IJ SOLN
INTRAMUSCULAR | Status: AC
Start: 2023-11-16 — End: 2023-11-16
  Administered 2023-11-16: 10 mL
  Filled 2023-11-16: qty 10

## 2023-11-16 NOTE — ED Triage Notes (Signed)
 Pt arrived via POV c/o possible DVT in RLE. 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 Pt reports swelling and pain have worsened over past week. Pt denies injury.

## 2023-11-16 NOTE — ED Provider Notes (Signed)
 [place] EMERGENCY DEPARTMENT AT [facility] Provider Note   CSN: [id number] Arrival date & time: 11/16/23  1458     History  Chief Complaint  Patient presents with   DVT    Clayton Pena is a 63 y.o. male. He has history of obesity, cellulitis, CAD, DVT on Eliquis .  He presents to the ER with left leg swelling pain and redness.  Started several days ago when he had a misstep and was having increased pain but since it did not get better with resting it, he came to the ER, was worried he had developed another blood clot.  He denies numbness or tingling.  He is able to bear weight and ambulate.  He denies fevers or chills. HPI     Home Medications Prior to Admission medications   Medication Sig Start Date End Date Taking? Authorizing Provider  acetaminophen -codeine  (TYLENOL  #3) 300-30 MG tablet Take 1 tablet by mouth every 6 (six) hours as needed for moderate pain. 08/11/22   Jerrlyn Morel, NP  albuterol  (VENTOLIN  HFA) 108 (90 Base) MCG/ACT inhaler Inhale 2 puffs into the lungs every 6 (six) hours as needed for wheezing or shortness of breath. 07/09/22   Jerrlyn Morel, NP  apixaban  (ELIQUIS ) 5 MG TABS tablet Take 1 tablet (5 mg total) by mouth 2 (two) times daily. Patient not taking: Reported on 09/01/2023 09/01/23   Jerrlyn Morel, NP  APPLE CIDER VINEGAR PO Take by mouth.    [provider]  Ascorbic Acid (VITAMIN C PO) Take by mouth.    [provider]  cetirizine  (ZYRTEC ) 10 MG tablet Take 1 tablet (10 mg total) by mouth at bedtime. 02/12/22   Jerrlyn Morel, NP  cyclobenzaprine  (FLEXERIL ) 5 MG tablet Take 1 tablet (5 mg total) by mouth 3 (three) times daily as needed for muscle spasms. 09/27/22   Idol, Julie, PA-C  Elastic Bandages & Supports (MEDICAL COMPRESSION STOCKINGS) MISC 1 each by Does not apply route daily. 12/07/16   Sigurd Driver, FNP  Ferrous Sulfate  (IRON ) 325 (65 Fe) MG TABS Take 1 tablet (325 mg total) by mouth daily with breakfast.  09/01/23   Nichols, Tonya S, NP  furosemide  (LASIX ) 20 MG tablet Take 1 tablet (20 mg total) by mouth daily. 10/17/23   Paseda, Folashade R, FNP  hydrochlorothiazide  (HYDRODIURIL ) 25 MG tablet Take 1 tablet (25 mg total) by mouth daily. 09/01/23   Jerrlyn Morel, NP  ibuprofen  (ADVIL ) 200 MG tablet Take 600 mg by mouth every 6 (six) hours as needed for moderate pain.    [provider]  MAGNESIUM PO Take by mouth.    [provider]  zinc  gluconate 50 MG tablet Take 1 tablet (50 mg total) by mouth daily. 02/27/18   Emmie Harness, FNP      Allergies    Penicillins, Demerol  [meperidine hcl], and Demerol [meperidine]    Review of Systems   Review of Systems  Physical Exam Updated Vital Signs BP (!) 170/89 (BP Location: Right Arm)   Pulse 90   Temp 98.4 F (36.9 C) (Oral)   Resp 16   Ht 6' (1.829 m)   Wt (!) 154 kg   SpO2 99%   BMI 46.05 kg/m  Physical Exam Vitals and nursing note reviewed.  Constitutional:      General: He is not in acute distress.    Appearance: He is well-developed.  HENT:     Head: Normocephalic and atraumatic.  Mouth/Throat:     Mouth: Mucous membranes are moist.  Eyes:     Conjunctiva/sclera: Conjunctivae normal.  Cardiovascular:     Rate and Rhythm: Normal rate and regular rhythm.     Heart sounds: No murmur heard. Pulmonary:     Effort: Pulmonary effort is normal. No respiratory distress.     Breath sounds: Normal breath sounds.  Abdominal:     Palpations: Abdomen is soft.     Tenderness: There is no abdominal tenderness.  Musculoskeletal:        General: Tenderness present. No swelling.     Cervical back: Neck supple.     Left lower leg: Edema present.     Comments: Tenderness with erythema to the distal third of the left leg and to the dorsum of the left foot.  There is warmth of the skin.  The erythema is blanching.  DP and PT pulses are intact.  No tenderness to the ankle or knee joint.  There is tenderness to the posterior  calf diffusely.  I am not able to palpate a mass.  Skin:    General: Skin is warm and dry.     Capillary Refill: Capillary refill takes less than 2 seconds.  Neurological:     General: No focal deficit present.     Mental Status: He is alert and oriented to person, place, and time.  Psychiatric:        Mood and Affect: Mood normal.     ED Results / Procedures / Treatments   Labs (all labs ordered are listed, but only abnormal results are displayed) Labs Reviewed  CBC WITH DIFFERENTIAL/PLATELET  BASIC METABOLIC PANEL WITH GFR    EKG None  Radiology US  Venous Img Lower Bilateral (DVT) Result Date: 11/16/2023 CLINICAL DATA:  Left foot pain post fall 10 days ago EXAM: BILATERAL LOWER EXTREMITY VENOUS DOPPLER ULTRASOUND TECHNIQUE: Gray-scale sonography with compression, as well as color and duplex ultrasound, were performed to evaluate the deep venous system(s) from the level of the common femoral vein through the popliteal and proximal calf veins. COMPARISON:  None Available. FINDINGS: VENOUS Normal compressibility of the common femoral, superficial femoral, and popliteal veins, as well as the visualized calf veins. Visualized portions of profunda femoral vein and great saphenous vein unremarkable. No filling defects to suggest DVT on grayscale or color Doppler imaging. Doppler waveforms show normal direction of venous flow, normal respiratory plasticity and response to augmentation. OTHER 4.5 x 1 cm complex cyst in the proximal left calf. Limitations: none IMPRESSION: 1. Negative for DVT. 2. 4.5 cm complex cyst in the proximal left calf. . Electronically Signed   By: Nicoletta Barrier M.D.   On: 11/16/2023 16:14    Procedures Procedures    Medications Ordered in ED Medications - No data to display  ED Course/ Medical Decision Making/ A&P                                 Medical Decision Making This patient presents to the ED for concern of left leg swelling and redness, this involves an  extensive number of treatment options, and is a complaint that carries with it a high risk of complications and morbidity.  The differential diagnosis includes cellulitis, DVT, abscess, sprain, other   Co morbidities that complicate the patient evaluation  DVT, CAD   Additional history obtained:  Additional history obtained from EMR External records from outside source obtained and  reviewed including ER notes and labs     Imaging Studies ordered:  I ordered imaging studies including chest and left lower extremity I independently visualized and interpreted imaging which showed no DVT but patient does have 4.5 x 1 cm complex cyst in the proximal calf. I agree with the radiologist interpretation     Problem List / ED Course / Critical interventions / Medication management  Left leg pain-redness warmth and swelling noted over the past week lacking better with elevation and rest.  Patient was worried about DVT, no DVT on ultrasound he does have a complex cyst in the proximal calf, which is not where his redness and swelling is primarily.  Advised follow-up with orthopedics for this but he does not have an appearance of cellulitis of the leg and foot so we will start him on antibiotics.  He does have history of penicillin allergy so he was given a third-generation cephalosporin after he had a discussion he tolerated this well, was observed for about 40 to 45 minutes after he received this and did not have any symptoms.  Saturations were normal, no rash, itching, tongue throat or lip swelling.  Will treat him at home with third-generation cephalosporin p.o. he was advised on follow-up with PCP to recheck and was given strict return precautions.  I have reviewed the patients home medicines and have made adjustments as needed    Test / Admission - Considered:  Considered labs but patient's ultrasound was already returned, he is not having fever, no tachycardia or tachypnea to suggest that  he is going to meet SIRS criteria even with a leukocytosis, I do not feel this would change my management and he has no other symptoms.  Discussed with patient he was agreeable with foregoing labs at this time    Amount and/or Complexity of Data Reviewed Labs: ordered.  Risk Prescription drug management.           Final Clinical Impression(s) / ED Diagnoses Final diagnoses:  None    Rx / DC Orders ED Discharge Orders     None         Joshua Nieves 11/16/23 1818    Cheyenne Cotta, MD 11/18/23 1258

## 2023-11-16 NOTE — Discharge Instructions (Signed)
 Was a pleasure taking care of you today.  You arrived to the ER for your left leg redness and swelling.  The ultrasound did not show signs of a blood clot.  The redness and warmth is concerning for cellulitis.  We are treating with antibiotics.  Keep the foot elevated to help with the swelling.  The ultrasound did show a cyst in your calf.  This is not in the area of the redness, this does not seem to be related, please follow-up with orthopedics regarding this.  If you have increased redness or swelling, develop a fever or other worrisome changes come back to the ER right away.

## 2023-11-16 NOTE — ED Notes (Signed)
 Patient has bilateral bounding pulses in both feet. Patient also has swelling that is more prominent in right foot. Patients feet are red and purple in color. Patient states his pain is an 8/10. Patient states he rolled his right ankle approximately 10 days ago.

## 2023-11-17 ENCOUNTER — Other Ambulatory Visit: Payer: Self-pay | Admitting: Nurse Practitioner

## 2023-11-17 MED ORDER — ACETAMINOPHEN-CODEINE 300-30 MG PO TABS: 1.0000 | ORAL_TABLET | Freq: Four times a day (QID) | ORAL | 0 refills | Status: DC | PRN

## 2023-11-17 NOTE — Telephone Encounter (Signed)
 Copied from CRM [id number]. Topic: Clinical - Medication Refill >> Nov 17, 2023  3:56 PM Crispin Dolphin wrote: Most Recent Primary Care Visit:  Provider: Jerrlyn Morel  Department: [facility]  Visit Type: PHYSICAL  Date: 07/04/2023  Medication: acetaminophen -codeine  (TYLENOL  #3) 300-30 MG tablet  Has the patient contacted their pharmacy? No (Agent: If no, request that the patient contact the pharmacy for the refill. If patient does not wish to contact the pharmacy document the reason why and proceed with request.) (Agent: If yes, when and what did the pharmacy advise?)  Is this the correct pharmacy for this prescription? Yes If no, delete pharmacy and type the correct one.  This is the patient's preferred pharmacy:  [facility] [address] [place] [address] [place] [address] [place] [address] Phone: [phone] Fax: [phone]   Has the prescription been filled recently? No  Is the patient out of the medication? Yes  Has the patient been seen for an appointment in the last year OR does the patient have an upcoming appointment? Yes  Can we respond through MyChart? No  Agent: Please be advised that Rx refills may take up to 3 business days. We ask that you follow-up with your pharmacy.

## 2023-11-21 ENCOUNTER — Telehealth: Payer: Self-pay | Admitting: *Deleted

## 2023-11-21 ENCOUNTER — Ambulatory Visit: Payer: Self-pay

## 2023-11-21 NOTE — Telephone Encounter (Signed)
 Patient called to advise that he was seen in the ER 4/23 for cellulitis of left foot and given and injection and sent home on a 7 day course of antibiotics.  He is on day 4/7 and states pain is better, however redness and swelling remain present.  He has an appointment for follow up that was made today on Thursday.  Advised him to return to the ER if symptoms worsen or fever develops.  Verbalized understanding.

## 2023-11-21 NOTE — Telephone Encounter (Signed)
 Copied from CRM [id number]. Topic: Clinical - Red Word Triage >> Nov 21, 2023  2:48 PM Clayton Pena wrote: Red Word that prompted transfer to Nurse Triage: swollen foot red puffy unsure how to proceed. Was at ED and taking medication 4th day   Chief Complaint: Foot swelling  Symptoms: Left foot swelling  Frequency: Constant  Pertinent Negatives: Patient denies any pain at this time Disposition: [] ED /[] Urgent Care (no appt availability in office) / [x] Appointment(In office/virtual)/ []  [facility]/ [] Home Care/ [] Refused Recommended Disposition /[] [facility]/ []  Follow-up with PCP Additional Notes: Patient reports he has had left foot swelling and redness for the last 2-3 weeks. He states he was seen in the ED for the same and diagnosed with cellulitis and placed on an antibiotic which he is still taking. He states that his pain is resolved but the foot is still swollen and red and would like to have it re-evaluated. Appointment made for the patient this Thursday.   Reason for Disposition  MILD or MODERATE ankle swelling (e.g., can't move joint normally, can't do usual activities) (Exceptions: Itchy, localized swelling; swelling is chronic.)  Answer Assessment - Initial Assessment Questions 1. LOCATION: "Which ankle is swollen?" "Where is the swelling?"     Left foot 2. ONSET: "When did the swelling start?"     2-3 weeks ago  3. SWELLING: "How bad is the swelling?" Or, "How large is it?" (e.g., mild, moderate, severe; size of localized swelling)    - NONE: No joint swelling.   - LOCALIZED: Localized; small area of puffy or swollen skin (e.g., insect bite, skin irritation).   - MILD: Joint looks or feels mildly swollen or puffy.   - MODERATE: Swollen; interferes with normal activities (e.g., work or school); decreased range of movement; may be limping.   - SEVERE: Very swollen; can't move swollen joint at all; limping a lot or unable to walk.     Moderate foot swelling   4. PAIN: "Is there any pain?" If Yes, ask: "How bad is it?" (Scale 1-10; or mild, moderate, severe)   - NONE (0): no pain.   - MILD (1-3): doesn't interfere with normal activities.    - MODERATE (4-7): interferes with normal activities (e.g., work or school) or awakens from sleep, limping.    - SEVERE (8-10): excruciating pain, unable to do any normal activities, unable to walk.      None 5. CAUSE: "What do you think caused the ankle swelling?"     Diagnosed with cellulitis  6. OTHER SYMPTOMS: "Do you have any other symptoms?" (e.g., fever, chest pain, difficulty breathing, calf pain)     No  Protocols used: Ankle Swelling-A-AH

## 2023-11-22 ENCOUNTER — Encounter: Payer: Self-pay | Admitting: Nurse Practitioner

## 2023-11-22 ENCOUNTER — Ambulatory Visit: Admitting: Nurse Practitioner

## 2023-11-22 VITALS — BP 143/77 | HR 89 | Temp 97.8°F | Wt 349.2 lb

## 2023-11-22 DIAGNOSIS — Z86718 Personal history of other venous thrombosis and embolism: Secondary | ICD-10-CM | POA: Diagnosis not present

## 2023-11-22 DIAGNOSIS — Z09 Encounter for follow-up examination after completed treatment for conditions other than malignant neoplasm: Secondary | ICD-10-CM | POA: Insufficient documentation

## 2023-11-22 DIAGNOSIS — L729 Follicular cyst of the skin and subcutaneous tissue, unspecified: Secondary | ICD-10-CM | POA: Insufficient documentation

## 2023-11-22 DIAGNOSIS — R600 Localized edema: Secondary | ICD-10-CM | POA: Insufficient documentation

## 2023-11-22 DIAGNOSIS — L03116 Cellulitis of left lower limb: Secondary | ICD-10-CM | POA: Diagnosis not present

## 2023-11-22 DIAGNOSIS — I10 Essential (primary) hypertension: Secondary | ICD-10-CM | POA: Diagnosis not present

## 2023-11-22 MED ORDER — CEFDINIR 300 MG PO CAPS: 300.0000 mg | ORAL_CAPSULE | Freq: Two times a day (BID) | ORAL | 0 refills | Status: AC

## 2023-11-22 NOTE — Assessment & Plan Note (Addendum)
 On hydrochlorothiazide  25 mg daily, furosemide  20 mg daily Blood pressure goal is less than 130/80 discussed May need changes to his medication at his upcoming appointment with PCP DASH diet and commitment to daily physical activity for a minimum of 30 minutes discussed and encouraged, as a part of hypertension management. The importance of attaining a healthy weight is also discussed.     11/22/2023   10:50 AM 11/22/2023   10:44 AM 11/16/2023    4:30 PM 11/16/2023    3:05 PM 09/01/2023    1:07 PM 08/29/2023    8:51 AM 08/29/2023    8:43 AM  BP/Weight  Systolic BP 143 150 147 170 162 172 168  Diastolic BP 77 69 82 89 93 84 83  Wt. (Lbs)  349.2  339.51 338  337.97  BMI  47.36 kg/m2  46.05 kg/m2 45.84 kg/m2  45.84 kg/m2

## 2023-11-22 NOTE — Assessment & Plan Note (Signed)
 Hospital chart reviewed, including discharge summary Medications reconciled and reviewed with the patient in detail

## 2023-11-22 NOTE — Assessment & Plan Note (Signed)
 4.5 x 1 cm complex cyst in the proximal left calf. Continue Tylenol  3 , 1 tablet every 6 hours as needed Encouraged to follow-up with orthopedics

## 2023-11-22 NOTE — Patient Instructions (Addendum)
 For the swelling in your lower extremities, be sure to elevate your legs when able, mind the salt intake, stay physically active and consider wearing compression stockings.    1. Cellulitis of left lower extremity (Primary)  - cefdinir  (OMNICEF ) 300 MG capsule; Take 1 capsule (300 mg total) by mouth 2 (two) times daily for 7 days.  Dispense: 14 capsule; Refill: 0  2. History of DVT (deep vein thrombosis)   3. Essential hypertension, benign  Around 3 times per week, check your blood pressure 2 times per day. once in the morning and once in the evening. The readings should be at least one minute apart. Write down these values and bring them to your next nurse visit/appointment.  When you check your BP, make sure you have been doing something calm/relaxing 5 minutes prior to checking. Both feet should be flat on the floor and you should be sitting. Use your left arm and make sure it is in a relaxed position (on a table), and that the cuff is at the approximate level/height of your heart.  Blood pressure goal is less than 130/80       It is important that you exercise regularly at least 30 minutes 5 times a week as tolerated  Think about what you will eat, plan ahead. Choose " clean, green, fresh or frozen" over canned, processed or packaged foods which are more sugary, salty and fatty. 70 to 75% of food eaten should be vegetables and fruit. Three meals at set times with snacks allowed between meals, but they must be fruit or vegetables. Aim to eat over a 12 hour period , example 7 am to 7 pm, and STOP after  your last meal of the day. Drink water ,generally about 64 ounces per day, no other drink is as healthy. Fruit juice is best enjoyed in a healthy way, by EATING the fruit.  Thanks for choosing [facility] we consider it a privelige to serve you.

## 2023-11-22 NOTE — Assessment & Plan Note (Signed)
 Encouraged to restart Eliquis  5 mg twice daily since his surgery is on hold

## 2023-11-22 NOTE — Assessment & Plan Note (Signed)
 Elevation, compression and low-salt diet encouraged

## 2023-11-22 NOTE — Telephone Encounter (Signed)
 Sent to admin pool for appt. KH

## 2023-11-22 NOTE — Progress Notes (Signed)
 Established Patient Office Visit  Subjective:  Patient ID: Clayton Pena, male    DOB: 1960/08/30  Age: 63 y.o. MRN: [id number]  CC:  Chief Complaint  Patient presents with   Hospitalization Follow-up    HPI Clayton Pena is a 63 y.o. male  has a past medical history of Acute suppr otitis media w/o spon rupt ear drum, right ear (05/2019), Angina at rest [facility]), Coronary artery disease, DVT (deep venous thrombosis) (HCC), Hypertension, and MI (myocardial infarction) (HCC).  Patient presents for hospitalization follow-up for cellulitis of left lower extremity  Cellulitis of left lower extremity patient was at the emergency department on 11/16/2023 for left leg swelling, pain and redness.  Imaging studies was negative for DVT but the patient does have a 4.5 x 1 cm complex cyst in the proximal calf.  He was started on Omnicef  300 mg twice daily, has taken the medication for 6 days but still has some swelling and redness with pain on the affected leg, states that the redness is better.  He denies fever, chills numbness, tingling ,able to bear weight and ambulate.  His pain is currently 4/10, stated that he is pain had resolved about 2 days ago but it has returned.   History of DVT.  On Eliquis  5 milligrams twice daily but he had stopped taking the medication because he was preparing for a hernia repair surgery.  Surgery is on hold pending the resolution of his left lower extremity cellulitis.      Past Medical History:  Diagnosis Date   Acute suppr otitis media w/o spon rupt ear drum, right ear 05/2019   Angina at rest [facility])    Coronary artery disease    DVT (deep venous thrombosis) (HCC)    right leg   Hypertension    MI (myocardial infarction) (HCC)    2006, 2007, 2011    Past Surgical History:  Procedure Laterality Date   BACK SURGERY     CORONARY ANGIOPLASTY     HERNIA REPAIR      Family History  Problem Relation Age of Onset   Hypertension Mother    CAD Mother    Diabetes  Mother    Hypertension Father    CAD Father    Prostate cancer Father    Diabetes Brother    Stroke Neg Hx     Social History   Socioeconomic History   Marital status: Married    Spouse name: Not on file   Number of children: Not on file   Years of education: Not on file   Highest education level: Not on file  Occupational History   Not on file  Tobacco Use   Smoking status: Never   Smokeless tobacco: Never  Vaping Use   Vaping status: Never Used  Substance and Sexual Activity   Alcohol use: No    Comment: occasional   Drug use: No   Sexual activity: Yes    Partners: Female  Other Topics Concern   Not on file  Social History Narrative   Not on file   Social Drivers of Health   Financial Resource Strain: Not on file  Food Insecurity: No Food Insecurity (07/19/2023)   Hunger Vital Sign    Worried About Running Out of Food in the Last Year: Never true    Ran Out of Food in the Last Year: Never true  Transportation Needs: No Transportation Needs (07/19/2023)   PRAPARE - Administrator, Civil Service (Medical): No  Lack of Transportation (Non-Medical): No  Physical Activity: Not on file  Stress: Not on file  Social Connections: Not on file  Intimate Partner Violence: Not At Risk (07/19/2023)   Humiliation, Afraid, Rape, and Kick questionnaire    Fear of Current or Ex-Partner: No    Emotionally Abused: No    Physically Abused: No    Sexually Abused: No    Outpatient Medications Prior to Visit  Medication Sig Dispense Refill   acetaminophen -codeine  (TYLENOL  #3) 300-30 MG tablet Take 1 tablet by mouth every 6 (six) hours as needed for moderate pain (pain score 4-6). 10 tablet 0   albuterol  (VENTOLIN  HFA) 108 (90 Base) MCG/ACT inhaler Inhale 2 puffs into the lungs every 6 (six) hours as needed for wheezing or shortness of breath. 18 g 3   APPLE CIDER VINEGAR PO Take by mouth.     Ascorbic Acid (VITAMIN C PO) Take by mouth.     cetirizine  (ZYRTEC ) 10  MG tablet Take 1 tablet (10 mg total) by mouth at bedtime. 30 tablet 11   Ferrous Sulfate  (IRON ) 325 (65 Fe) MG TABS Take 1 tablet (325 mg total) by mouth daily with breakfast. 90 tablet 0   furosemide  (LASIX ) 20 MG tablet Take 1 tablet (20 mg total) by mouth daily. 60 tablet 1   hydrochlorothiazide  (HYDRODIURIL ) 25 MG tablet Take 1 tablet (25 mg total) by mouth daily. 60 tablet 0   MAGNESIUM PO Take by mouth.     zinc  gluconate 50 MG tablet Take 1 tablet (50 mg total) by mouth daily. 30 tablet 3   cefdinir  (OMNICEF ) 300 MG capsule Take 1 capsule (300 mg total) by mouth 2 (two) times daily for 7 days. 14 capsule 0   apixaban  (ELIQUIS ) 5 MG TABS tablet Take 1 tablet (5 mg total) by mouth 2 (two) times daily. (Patient not taking: Reported on 11/22/2023) 180 tablet 0   cyclobenzaprine  (FLEXERIL ) 5 MG tablet Take 1 tablet (5 mg total) by mouth 3 (three) times daily as needed for muscle spasms. (Patient not taking: Reported on 11/22/2023) 15 tablet 0   Elastic Bandages & Supports (MEDICAL COMPRESSION STOCKINGS) MISC 1 each by Does not apply route daily. (Patient not taking: Reported on 11/22/2023) 1 each 2   ibuprofen  (ADVIL ) 200 MG tablet Take 600 mg by mouth every 6 (six) hours as needed for moderate pain. (Patient not taking: Reported on 11/22/2023)     No facility-administered medications prior to visit.    Allergies  Allergen Reactions   Penicillins Anaphylaxis    Has patient had a PCN reaction causing immediate rash, facial/tongue/throat swelling, SOB or lightheadedness with hypotension: unknown Has patient had a PCN reaction causing severe rash involving mucus membranes or skin necrosis: unknown Has patient had a PCN reaction that required hospitalization: unknown Has patient had a PCN reaction occurring within the last 10 years: no If all of the above answers are "NO", then may proceed with Cephalosporin use.    Demerol  [Meperidine Hcl] Other (See Comments)    REACTION:  Increases blood  pressure   Demerol [Meperidine] Other (See Comments)    REACTION:  Increases blood pressure    ROS Review of Systems  Constitutional:  Negative for appetite change, chills, fatigue and fever.  HENT:  Negative for congestion, postnasal drip, rhinorrhea and sneezing.   Respiratory:  Negative for cough, shortness of breath and wheezing.   Cardiovascular:  Negative for chest pain, palpitations and leg swelling.  Gastrointestinal:  Positive for abdominal distention.  Negative for abdominal pain, constipation, nausea and vomiting.  Genitourinary:  Negative for difficulty urinating, dysuria, flank pain and frequency.  Musculoskeletal:  Positive for arthralgias and joint swelling. Negative for back pain and myalgias.  Skin:  Positive for color change. Negative for pallor, rash and wound.  Neurological:  Negative for dizziness, facial asymmetry, weakness, numbness and headaches.  Psychiatric/Behavioral:  Negative for behavioral problems, confusion, self-injury and suicidal ideas.       Objective:    Physical Exam Vitals and nursing note reviewed.  Constitutional:      General: He is not in acute distress.    Appearance: Normal appearance. He is obese. He is not ill-appearing, toxic-appearing or diaphoretic.  HENT:     Mouth/Throat:     Pharynx: No oropharyngeal exudate.  Eyes:     General: No scleral icterus.       Right eye: No discharge.        Left eye: No discharge.     Extraocular Movements: Extraocular movements intact.     Conjunctiva/sclera: Conjunctivae normal.  Cardiovascular:     Rate and Rhythm: Normal rate and regular rhythm.     Pulses: Normal pulses.     Heart sounds: Normal heart sounds. No murmur heard.    No friction rub. No gallop.  Pulmonary:     Effort: Pulmonary effort is normal. No respiratory distress.     Breath sounds: Normal breath sounds. No stridor. No wheezing, rhonchi or rales.  Chest:     Chest wall: No tenderness.  Abdominal:     General: There is  no distension.     Palpations: Abdomen is soft.     Tenderness: There is no abdominal tenderness. There is no right CVA tenderness, left CVA tenderness or guarding.  Musculoskeletal:        General: Swelling and tenderness present. No deformity or signs of injury.     Right lower leg: Edema present.     Left lower leg: Edema present.     Comments:  Tenderness with erythema to the distal part of left leg, skin is warm and dry erythema is blanching.  Unable to palpate DP due to swelling  A cyst palpated  in the proximal left calf.      Skin:    General: Skin is warm and dry.     Coloration: Skin is not jaundiced or pale.     Findings: No bruising, erythema or lesion.  Neurological:     Mental Status: He is alert and oriented to person, place, and time.     Motor: No weakness.     Coordination: Coordination normal.     Gait: Gait normal.  Psychiatric:        Mood and Affect: Mood normal.        Behavior: Behavior normal.        Thought Content: Thought content normal.        Judgment: Judgment normal.     BP (!) 143/77   Pulse 89   Temp 97.8 F (36.6 C) (Oral)   Wt (!) 349 lb 3.2 oz (158.4 kg)   SpO2 97%   BMI 47.36 kg/m  Wt Readings from Last 3 Encounters:  11/22/23 (!) 349 lb 3.2 oz (158.4 kg)  11/16/23 (!) 339 lb 8.1 oz (154 kg)  09/01/23 (!) 338 lb (153.3 kg)    Lab Results  Component Value Date   TSH 2.010 07/04/2023   Lab Results  Component Value Date   WBC 14.3 (  H) 07/19/2023   HGB 15.3 07/19/2023   HCT 45.3 07/19/2023   MCV 89.2 07/19/2023   PLT 214 07/19/2023   Lab Results  Component Value Date   NA 139 07/04/2023   K 4.2 07/04/2023   CO2 20 07/04/2023   GLUCOSE 142 (H) 07/04/2023   BUN 15 07/04/2023   CREATININE 0.89 07/04/2023   BILITOT 0.4 07/04/2023   ALKPHOS 51 07/04/2023   AST 22 07/04/2023   ALT 27 07/04/2023   PROT 6.1 07/04/2023   ALBUMIN 3.8 (L) 07/04/2023   CALCIUM 8.7 07/04/2023   ANIONGAP 13 03/23/2023   EGFR 96 07/04/2023    Lab Results  Component Value Date   CHOL 113 07/04/2023   Lab Results  Component Value Date   HDL 37 (L) 07/04/2023   Lab Results  Component Value Date   LDLCALC 61 07/04/2023   Lab Results  Component Value Date   TRIG 75 07/04/2023   Lab Results  Component Value Date   CHOLHDL 3.1 07/04/2023   Lab Results  Component Value Date   HGBA1C 6.0 04/27/2022      Assessment & Plan:   Problem List Items Addressed This Visit       Cardiovascular and Mediastinum   Essential hypertension, benign   On hydrochlorothiazide  25 mg daily, furosemide  20 mg daily Blood pressure goal is less than 130/80 discussed May need changes to his medication at his upcoming appointment with PCP DASH diet and commitment to daily physical activity for a minimum of 30 minutes discussed and encouraged, as a part of hypertension management. The importance of attaining a healthy weight is also discussed.     11/22/2023   10:50 AM 11/22/2023   10:44 AM 11/16/2023    4:30 PM 11/16/2023    3:05 PM 09/01/2023    1:07 PM 08/29/2023    8:51 AM 08/29/2023    8:43 AM  BP/Weight  Systolic BP 143 150 147 170 162 172 168  Diastolic BP 77 69 82 89 93 84 83  Wt. (Lbs)  349.2  339.51 338  337.97  BMI  47.36 kg/m2  46.05 kg/m2 45.84 kg/m2  45.84 kg/m2             Other   Cellulitis - Primary   Relevant Medications   cefdinir  (OMNICEF ) 300 MG capsule   Other Relevant Orders   CBC   History of DVT (deep vein thrombosis)   Encouraged to restart Eliquis  5 mg twice daily since his surgery is on hold      Bilateral lower extremity edema   Elevation, compression and low-salt diet encouraged      Encounter for examination following treatment at hospital   Hospital chart reviewed, including discharge summary Medications reconciled and reviewed with the patient in detail       Subcutaneous cyst   4.5 x 1 cm complex cyst in the proximal left calf. Continue Tylenol  3 , 1 tablet every 6 hours as  needed Encouraged to follow-up with orthopedics       Meds ordered this encounter  Medications   cefdinir  (OMNICEF ) 300 MG capsule    Sig: Take 1 capsule (300 mg total) by mouth 2 (two) times daily for 7 days.    Dispense:  14 capsule    Refill:  0    Follow-up: No follow-ups on file.    Othello Dickenson R Stevi Hollinshead, FNP

## 2023-11-23 LAB — CBC
Hematocrit: 46.7 % (ref 37.5–51.0)
Hemoglobin: 15.5 g/dL (ref 13.0–17.7)
MCH: 29.5 pg (ref 26.6–33.0)
MCHC: 33.2 g/dL (ref 31.5–35.7)
MCV: 89 fL (ref 79–97)
Platelets: 283 10*3/uL (ref 150–450)
RBC: 5.26 x10E6/uL (ref 4.14–5.80)
RDW: 12.3 % (ref 11.6–15.4)
WBC: 13.4 10*3/uL — ABNORMAL HIGH (ref 3.4–10.8)

## 2023-11-24 ENCOUNTER — Ambulatory Visit: Payer: Self-pay | Admitting: Nurse Practitioner

## 2023-11-29 ENCOUNTER — Emergency Department (HOSPITAL_COMMUNITY)
Admission: EM | Admit: 2023-11-29 | Discharge: 2023-11-29 | Disposition: A | Discharge status: Home / Self Care | Attending: Emergency Medicine | Admitting: Emergency Medicine

## 2023-11-29 ENCOUNTER — Encounter (HOSPITAL_COMMUNITY): Payer: Self-pay

## 2023-11-29 ENCOUNTER — Other Ambulatory Visit: Payer: Self-pay

## 2023-11-29 DIAGNOSIS — I2510 Atherosclerotic heart disease of native coronary artery without angina pectoris: Secondary | ICD-10-CM | POA: Diagnosis not present

## 2023-11-29 DIAGNOSIS — Z7901 Long term (current) use of anticoagulants: Secondary | ICD-10-CM | POA: Insufficient documentation

## 2023-11-29 DIAGNOSIS — L0390 Cellulitis, unspecified: Secondary | ICD-10-CM

## 2023-11-29 DIAGNOSIS — I252 Old myocardial infarction: Secondary | ICD-10-CM | POA: Diagnosis not present

## 2023-11-29 DIAGNOSIS — I10 Essential (primary) hypertension: Secondary | ICD-10-CM | POA: Diagnosis not present

## 2023-11-29 DIAGNOSIS — Z79899 Other long term (current) drug therapy: Secondary | ICD-10-CM | POA: Insufficient documentation

## 2023-11-29 DIAGNOSIS — L03116 Cellulitis of left lower limb: Secondary | ICD-10-CM | POA: Insufficient documentation

## 2023-11-29 DIAGNOSIS — R2242 Localized swelling, mass and lump, left lower limb: Secondary | ICD-10-CM | POA: Diagnosis present

## 2023-11-29 MED ORDER — DEXTROSE 5 % IV SOLN
1500.0000 mg | Freq: Once | INTRAVENOUS | Status: AC
Start: 2023-11-29 — End: 2023-11-29
  Administered 2023-11-29: 1500 mg via INTRAVENOUS
  Filled 2023-11-29: qty 75

## 2023-11-29 NOTE — ED Provider Notes (Signed)
 [place] EMERGENCY DEPARTMENT AT [facility] Provider Note   CSN: [id number] Arrival date & time: 11/29/23  1257     History {Add pertinent medical, surgical, social history, OB history to HPI:1} Chief Complaint  Patient presents with   Leg Swelling    Clayton Pena is a 63 y.o. male with PMH as listed below who presents with left leg infection, swelling and pain. Pt reports he has a few doses of antibiotics left but swelling and pain has not been relieved. Pt reports following up with PCP following recent hospital visit as well.  Per chart review, was diagnosed w/ LLE cellulitis on 11/16/23, had a neg DVT US  at that time,  but the patient does have a 4.5 x 1 cm complex cyst in the proximal calf. He was given omnicef , seen again on 11/22/23 with progression of his cellulitis,    He denies fever, chills numbness, tingling ,able to bear weight and ambulate.     He has h/o DVT, on Eliquis  5 milligrams twice daily but he had stopped taking the medication because he was preparing for a hernia repair surgery.  Surgery is on hold pending the resolution of his left lower extremity cellulitis.  Past Medical History:  Diagnosis Date   Acute suppr otitis media w/o spon rupt ear drum, right ear 05/2019   Angina at rest [facility])    Coronary artery disease    DVT (deep venous thrombosis) (HCC)    right leg   Hypertension    MI (myocardial infarction) (HCC)    2006, 2007, 2011       Home Medications Prior to Admission medications   Medication Sig Start Date End Date Taking? Authorizing Provider  acetaminophen -codeine  (TYLENOL  #3) 300-30 MG tablet Take 1 tablet by mouth every 6 (six) hours as needed for moderate pain (pain score 4-6). 11/17/23   Jerrlyn Morel, NP  albuterol  (VENTOLIN  HFA) 108 (90 Base) MCG/ACT inhaler Inhale 2 puffs into the lungs every 6 (six) hours as needed for wheezing or shortness of breath. 07/09/22   Jerrlyn Morel, NP  apixaban  (ELIQUIS ) 5 MG TABS tablet  Take 1 tablet (5 mg total) by mouth 2 (two) times daily. Patient not taking: Reported on 11/22/2023 09/01/23   Jerrlyn Morel, NP  APPLE CIDER VINEGAR PO Take by mouth.    [provider]  Ascorbic Acid (VITAMIN C PO) Take by mouth.    [provider]  cefdinir  (OMNICEF ) 300 MG capsule Take 1 capsule (300 mg total) by mouth 2 (two) times daily for 7 days. 11/22/23 11/29/23  Paseda, Folashade R, FNP  cetirizine  (ZYRTEC ) 10 MG tablet Take 1 tablet (10 mg total) by mouth at bedtime. 02/12/22   Jerrlyn Morel, NP  cyclobenzaprine  (FLEXERIL ) 5 MG tablet Take 1 tablet (5 mg total) by mouth 3 (three) times daily as needed for muscle spasms. Patient not taking: Reported on 11/22/2023 09/27/22   Idol, Julie, PA-C  Elastic Bandages & Supports (MEDICAL COMPRESSION STOCKINGS) MISC 1 each by Does not apply route daily. Patient not taking: Reported on 11/22/2023 12/07/16   Sigurd Driver, FNP  Ferrous Sulfate  (IRON ) 325 (65 Fe) MG TABS Take 1 tablet (325 mg total) by mouth daily with breakfast. 09/01/23   Nichols, Tonya S, NP  furosemide  (LASIX ) 20 MG tablet Take 1 tablet (20 mg total) by mouth daily. 10/17/23   Paseda, Folashade R, FNP  hydrochlorothiazide  (HYDRODIURIL ) 25 MG tablet Take 1 tablet (25 mg total) by mouth daily.  09/01/23   Jerrlyn Morel, NP  ibuprofen  (ADVIL ) 200 MG tablet Take 600 mg by mouth every 6 (six) hours as needed for moderate pain. Patient not taking: Reported on 11/22/2023    [provider]  MAGNESIUM PO Take by mouth.    [provider]  zinc  gluconate 50 MG tablet Take 1 tablet (50 mg total) by mouth daily. 02/27/18   Emmie Harness, FNP      Allergies    Penicillins, Demerol  [meperidine hcl], and Demerol [meperidine]    Review of Systems   Review of Systems A 10 point review of systems was performed and is negative unless otherwise reported in HPI.  Physical Exam Updated Vital Signs BP (!) 177/91 (BP Location: Right Arm)   Pulse 77   Temp 97.9  F (36.6 C) (Temporal)   Resp 18   Ht 6' (1.829 m)   Wt (!) 158.4 kg   SpO2 97%   BMI 47.36 kg/m  Physical Exam General: Normal appearing {Desc; male/male:11659}, lying in bed.  HEENT: PERRLA, Sclera anicteric, MMM, trachea midline.  Cardiology: RRR, no murmurs/rubs/gallops. BL radial and DP pulses equal bilaterally.  Resp: Normal respiratory rate and effort. CTAB, no wheezes, rhonchi, crackles.  Abd: Soft, non-tender, non-distended. No rebound tenderness or guarding.  GU: Deferred. MSK: No peripheral edema or signs of trauma. Extremities without deformity or TTP. No cyanosis or clubbing. Skin: warm, dry. No rashes or lesions. Back: No CVA tenderness Neuro: A&Ox4, CNs II-XII grossly intact. MAEs. Sensation grossly intact.  Psych: Normal mood and affect.   ED Results / Procedures / Treatments   Labs (all labs ordered are listed, but only abnormal results are displayed) Labs Reviewed - No data to display  EKG None  Radiology No results found.  Procedures Procedures  {Document cardiac monitor, telemetry assessment procedure when appropriate:1}  Medications Ordered in ED Medications - No data to display  ED Course/ Medical Decision Making/ A&P                          Medical Decision Making   This patient presents to the ED for concern of LLE cellulitis, this involves an extensive number of treatment options, and is a complaint that carries with it a high risk of complications and morbidity.  I considered the following differential and admission for this acute, potentially life threatening condition.   MDM:    *** Patient seems to have failed outpatient treatment for cellulitis. He has penicillin allergy and has never tolerated first gen cephalosporin. Would consider otherwise hospitalizing patient for failure of outpatient treatment, but will give dalvance instead. Patient is HDS.     Additional history obtained from chart review.    Reevaluation: After the  interventions noted above, I reevaluated the patient and found that they have :{resolved/improved/worsened:23923::"improved"}  Social Determinants of Health: Lives independently  Disposition:  DC w/ discharge instructions/return precautions. All questions answered to patient's satisfaction.    Co morbidities that complicate the patient evaluation  Past Medical History:  Diagnosis Date   Acute suppr otitis media w/o spon rupt ear drum, right ear 05/2019   Angina at rest [facility])    Coronary artery disease    DVT (deep venous thrombosis) (HCC)    right leg   Hypertension    MI (myocardial infarction) (HCC)    2006, 2007, 2011     Medicines No orders of the defined types were placed in this encounter.   I  have reviewed the patients home medicines and have made adjustments as needed  Problem List / ED Course: Problem List Items Addressed This Visit       Other   Cellulitis - Primary         {Document critical care time when appropriate:1} {Document review of labs and clinical decision tools ie heart score, Chads2Vasc2 etc:1}  {Document your independent review of radiology images, and any outside records:1} {Document your discussion with family members, caretakers, and with consultants:1} {Document social determinants of health affecting pt's care:1} {Document your decision making why or why not admission, treatments were needed:1}  This note was created using dictation software, which may contain spelling or grammatical errors.

## 2023-11-29 NOTE — ED Notes (Signed)
Pt given drink and crackers  

## 2023-11-29 NOTE — Progress Notes (Signed)
 Pharmacy Note: dalbavancin (DALVANCE) for Acute Bacterial Skin and Skin Structure Infection (ABSSSI) Patients to [facility] Discharge   Clayton Pena is a 63 y.o. male who presented to [facility] on 11/29/2023 with an ABSSSI.  Inclusion criteria:  Indication: Cellulitis  Patient has at least one SIRS criteria present: WBC > 12,000 or < 4,000 and RR < 20   Exclusion criteria: Patient was evaluated and negative for hardware involvement, hypotension / shock, elevated lactate > 2 without other explanation, gram-negative infection risk factors (bites, water  exposure, infection after trauma, infection after skin graft, burns, severe immunocompromise), necrotizing fasciitis possible / confirmed, known or suspected osteomyelitis or septic arthritis, endocarditis, diabetic foot infection, ischemic ulcers, post-operative wound infection, perirectal infection, need for drainage in the OR, hand / facial infections, injection drug users with  fever, bacteremia, pregnancy or breastfeeding, allergy related to antibiotics like vancomycin , known liver disease ( T.Bili > 2x ULN or AST/ALT > 3x ULN).  Terryn Redner L 11/29/2023, 2:07 PM Clinical Pharmacist

## 2023-11-29 NOTE — Discharge Instructions (Addendum)
 Thank you for coming to [facility] Emergency Department. You were seen for concern for worsening left leg infection (cellulitis). We treated you with a one-time injection of an antibiotic called Dalbavancin. Please follow up with your primary care physician within 1 week for reevaluation and to ensure improvement.  Do not hesitate to return to the ED or call 911 if you experience: -Worsening symptoms -Chest pain, shortness of breath -Lightheadedness, passing out -Fevers/chills -Anything else that concerns you

## 2023-11-29 NOTE — ED Triage Notes (Signed)
 Pt arrived via POV c/o left leg infection, swelling and pain. Pt reports he has a few doses of antibiotics left but swelling and pain has not been relieved. Pt reports following up with PCP following recent hospital visit as well. Pt ambulatory in Triage.

## 2023-11-30 ENCOUNTER — Other Ambulatory Visit (HOSPITAL_COMMUNITY): Payer: Self-pay

## 2023-12-07 ENCOUNTER — Other Ambulatory Visit: Payer: Self-pay

## 2023-12-07 ENCOUNTER — Encounter (HOSPITAL_COMMUNITY): Payer: Self-pay | Admitting: *Deleted

## 2023-12-07 ENCOUNTER — Emergency Department (HOSPITAL_COMMUNITY)
Admission: EM | Admit: 2023-12-07 | Discharge: 2023-12-07 | Disposition: A | Discharge status: Home / Self Care | Attending: Emergency Medicine | Admitting: Emergency Medicine

## 2023-12-07 DIAGNOSIS — Z7901 Long term (current) use of anticoagulants: Secondary | ICD-10-CM | POA: Insufficient documentation

## 2023-12-07 DIAGNOSIS — L03116 Cellulitis of left lower limb: Secondary | ICD-10-CM | POA: Diagnosis not present

## 2023-12-07 DIAGNOSIS — D72829 Elevated white blood cell count, unspecified: Secondary | ICD-10-CM | POA: Diagnosis not present

## 2023-12-07 LAB — I-STAT CHEM 8, ED
BUN: 17 mg/dL (ref 8–23)
Calcium, Ion: 1.17 mmol/L (ref 1.15–1.40)
Chloride: 98 mmol/L (ref 98–111)
Creatinine, Ser: 0.9 mg/dL (ref 0.61–1.24)
Glucose, Bld: 122 mg/dL — ABNORMAL HIGH (ref 70–99)
HCT: 45 % (ref 39.0–52.0)
Hemoglobin: 15.3 g/dL (ref 13.0–17.0)
Potassium: 3.7 mmol/L (ref 3.5–5.1)
Sodium: 137 mmol/L (ref 135–145)
TCO2: 26 mmol/L (ref 22–32)

## 2023-12-07 LAB — CBC WITH DIFFERENTIAL/PLATELET
Abs Immature Granulocytes: 0.08 10*3/uL — ABNORMAL HIGH (ref 0.00–0.07)
Basophils Absolute: 0.1 10*3/uL (ref 0.0–0.1)
Basophils Relative: 1 %
Eosinophils Absolute: 0 10*3/uL (ref 0.0–0.5)
Eosinophils Relative: 0 %
HCT: 46.3 % (ref 39.0–52.0)
Hemoglobin: 15.4 g/dL (ref 13.0–17.0)
Immature Granulocytes: 1 %
Lymphocytes Relative: 14 %
Lymphs Abs: 1.8 10*3/uL (ref 0.7–4.0)
MCH: 29.2 pg (ref 26.0–34.0)
MCHC: 33.3 g/dL (ref 30.0–36.0)
MCV: 87.9 fL (ref 80.0–100.0)
Monocytes Absolute: 0.9 10*3/uL (ref 0.1–1.0)
Monocytes Relative: 7 %
Neutro Abs: 10.2 10*3/uL — ABNORMAL HIGH (ref 1.7–7.7)
Neutrophils Relative %: 77 %
Platelets: 271 10*3/uL (ref 150–400)
RBC: 5.27 MIL/uL (ref 4.22–5.81)
RDW: 12.9 % (ref 11.5–15.5)
WBC: 13 10*3/uL — ABNORMAL HIGH (ref 4.0–10.5)
nRBC: 0 % (ref 0.0–0.2)

## 2023-12-07 MED ORDER — DOXYCYCLINE HYCLATE 100 MG PO CAPS: 100.0000 mg | ORAL_CAPSULE | Freq: Two times a day (BID) | ORAL | 0 refills | Status: DC

## 2023-12-07 MED ORDER — CEFDINIR 300 MG PO CAPS: 300.0000 mg | ORAL_CAPSULE | Freq: Two times a day (BID) | ORAL | 0 refills | Status: DC

## 2023-12-07 MED ORDER — DEXTROSE 5 % IV SOLN: 1500.0000 mg | Freq: Once | INTRAVENOUS | Status: DC

## 2023-12-07 MED ORDER — SODIUM CHLORIDE 0.9 % IV SOLN
1.0000 g | Freq: Once | INTRAVENOUS | Status: AC
  Administered 2023-12-07: 1 g via INTRAVENOUS
  Filled 2023-12-07: qty 10

## 2023-12-07 NOTE — ED Triage Notes (Signed)
 Pt has been seen here previously for cellulitis of left lower leg and has had a dose of IV antibiotics  but his PCP states he feels like he needs one more dose   Leg is still red and swollen but pt states his leg is much better than previous

## 2023-12-07 NOTE — ED Provider Notes (Signed)
 [place] EMERGENCY DEPARTMENT AT [facility] Provider Note   CSN: [id number] Arrival date & time: 12/07/23  0750     History  No chief complaint on file.   Clayton Pena is a 63 y.o. male.  He was sent in by his primary care doctor for another dose of IV antibiotics.  Has had ongoing cellulitis of his left lower leg.  Received a dose of Dalvance  about a week ago and had marked improvement in his symptoms.  He is awaiting follow-up with the ID clinic and has an appointment in a few weeks.  No fevers nausea vomiting.  He said it is much improved but not completely resolved and his PCP sent him here to get another dose of medication.  The history is provided by the patient.  Leg Pain Location:  Leg Leg location:  L lower leg Pain details:    Quality:  Aching   Progression:  Improving Associated symptoms: swelling        Home Medications Prior to Admission medications   Medication Sig Start Date End Date Taking? Authorizing Provider  acetaminophen -codeine  (TYLENOL  #3) 300-30 MG tablet Take 1 tablet by mouth every 6 (six) hours as needed for moderate pain (pain score 4-6). 11/17/23   Jerrlyn Morel, NP  albuterol  (VENTOLIN  HFA) 108 (90 Base) MCG/ACT inhaler Inhale 2 puffs into the lungs every 6 (six) hours as needed for wheezing or shortness of breath. 07/09/22   Jerrlyn Morel, NP  apixaban  (ELIQUIS ) 5 MG TABS tablet Take 1 tablet (5 mg total) by mouth 2 (two) times daily. Patient not taking: Reported on 11/22/2023 09/01/23   Jerrlyn Morel, NP  APPLE CIDER VINEGAR PO Take by mouth.    [provider]  Ascorbic Acid (VITAMIN C PO) Take by mouth.    [provider]  cetirizine  (ZYRTEC ) 10 MG tablet Take 1 tablet (10 mg total) by mouth at bedtime. 02/12/22   Jerrlyn Morel, NP  cyclobenzaprine  (FLEXERIL ) 5 MG tablet Take 1 tablet (5 mg total) by mouth 3 (three) times daily as needed for muscle spasms. Patient not taking: Reported on 11/22/2023 09/27/22    Idol, Julie, PA-C  Elastic Bandages & Supports (MEDICAL COMPRESSION STOCKINGS) MISC 1 each by Does not apply route daily. Patient not taking: Reported on 11/22/2023 12/07/16   Sigurd Driver, FNP  Ferrous Sulfate  (IRON ) 325 (65 Fe) MG TABS Take 1 tablet (325 mg total) by mouth daily with breakfast. 09/01/23   Nichols, Tonya S, NP  furosemide  (LASIX ) 20 MG tablet Take 1 tablet (20 mg total) by mouth daily. 10/17/23   Paseda, Folashade R, FNP  hydrochlorothiazide  (HYDRODIURIL ) 25 MG tablet Take 1 tablet (25 mg total) by mouth daily. 09/01/23   Jerrlyn Morel, NP  ibuprofen  (ADVIL ) 200 MG tablet Take 600 mg by mouth every 6 (six) hours as needed for moderate pain. Patient not taking: Reported on 11/22/2023    [provider]  MAGNESIUM PO Take by mouth.    [provider]  zinc  gluconate 50 MG tablet Take 1 tablet (50 mg total) by mouth daily. 02/27/18   Emmie Harness, FNP      Allergies    Penicillins, Demerol  [meperidine hcl], and Demerol [meperidine]    Review of Systems   Review of Systems  Physical Exam Updated Vital Signs BP (!) 160/112 (BP Location: Right Arm)   Pulse 80   Temp 98.2 F (36.8 C) (Oral)   Resp 18   Ht  6' (1.829 m)   Wt (!) 158.3 kg   SpO2 95%   BMI 47.33 kg/m  Physical Exam Vitals and nursing note reviewed.  Constitutional:      Appearance: Normal appearance. He is well-developed.  HENT:     Head: Normocephalic and atraumatic.  Eyes:     Conjunctiva/sclera: Conjunctivae normal.  Cardiovascular:     Rate and Rhythm: Normal rate and regular rhythm.  Pulmonary:     Effort: Pulmonary effort is normal.  Musculoskeletal:        General: Tenderness present.     Cervical back: Neck supple.     Left lower leg: Edema present.     Comments: He has some mild redness of his left lower extremity with some swelling and tenderness.  There are multiple small red papules but no areas of fluctuance and no drainage.  Skin:    General: Skin is warm and  dry.  Neurological:     General: No focal deficit present.     Mental Status: He is alert.     GCS: GCS eye subscore is 4. GCS verbal subscore is 5. GCS motor subscore is 6.     Gait: Gait normal.     ED Results / Procedures / Treatments   Labs (all labs ordered are listed, but only abnormal results are displayed) Labs Reviewed  CBC WITH DIFFERENTIAL/PLATELET - Abnormal; Notable for the following components:      Result Value   WBC 13.0 (*)    Neutro Abs 10.2 (*)    Abs Immature Granulocytes 0.08 (*)    All other components within normal limits  I-STAT CHEM 8, ED - Abnormal; Notable for the following components:   Glucose, Bld 122 (*)    All other components within normal limits    EKG None  Radiology No results found.  Procedures Procedures    Medications Ordered in ED Medications  cefTRIAXone  (ROCEPHIN ) 1 g in sodium chloride  0.9 % 100 mL IVPB (0 g Intravenous Stopped 12/07/23 1112)    ED Course/ Medical Decision Making/ A&P Clinical Course as of 12/07/23 1700  Wed Dec 07, 2023  0916 Pharmacy reached out to infectious disease and they are not recommending another IV dose of Dalvance .  I relayed that to the patient.  He is asking if he can get a shot of the other antibiotic that he got here because he is concerned that the infection has not completely gone away. [MB]  1029 Patient given an IV dose of ceftriaxone .  Will have him closely follow-up with his PCP. [MB]  1044 Patient now tells me he would like to get labs to see if his infection is improved.  I discussed the limitations of lab work in setting of being on antibiotics but we will send off some labs. [MB]  1142 White count came back at 13.  Reviewed with patient.  He is comfortable plan for outpatient treatment with antibiotics and close follow-up with his PCP.  Return instructions discussed [MB]    Clinical Course User Index [MB] Tonya Fredrickson, MD                                 Medical Decision  Making Amount and/or Complexity of Data Reviewed Labs: ordered.  Risk Prescription drug management.   This patient complains of cellulitis left lower extremity; this involves an extensive number of treatment Options and is a complaint that carries  with it a high risk of complications and morbidity. The differential includes cellulitis, DVT, abscess  I ordered, reviewed and interpreted labs, which included CBC with elevated white count I ordered medication the ceftriaxone  and reviewed PMP when indicated. Previous records obtained and reviewed in epic including recent ED notes I consulted pharmacy who also contacted infectious disease and discussed lab and imaging findings and discussed disposition.  Social determinants considered, no significant barriers Critical Interventions: None  After the interventions stated above, I reevaluated the patient and found patient to be well-appearing in no distress Admission and further testing considered, no indications for admission.  He is wishing to go back on oral antibiotics so have prescribed those.  He is recommended close follow-up with his treatment team.  Return instructions discussed         Final Clinical Impression(s) / ED Diagnoses Final diagnoses:  Left leg cellulitis    Rx / DC Orders ED Discharge Orders          Ordered    cefdinir  (OMNICEF ) 300 MG capsule  2 times daily        12/07/23 1113    doxycycline  (VIBRAMYCIN ) 100 MG capsule  2 times daily        12/07/23 1113              Tonya Fredrickson, MD 12/07/23 1702

## 2023-12-13 ENCOUNTER — Ambulatory Visit: Payer: Self-pay

## 2023-12-13 NOTE — Telephone Encounter (Signed)
  Chief Complaint: cellulitis follow up Symptoms: redness, swelling improved but not gone on LLE Frequency: started 11/19/23 Pertinent Negatives: Patient denies fever, pain Disposition: [] ED /[] Urgent Care (no appt availability in office) / [x] Appointment(In office/virtual)/ []  [place] [facility]/ [] Home Care/ [] Refused Recommended Disposition /[] [place] [facility]/ []  Follow-up with PCP Additional Notes: Patient states he has been seen twice in the ED for his left lower leg cellulitis. He states it is 70% better but he is concerned due to the antibiotics are done tomorrow and the cellulitis is not gone. Patient scheduled with PCP this week.  Reason for Disposition  [1] Finished taking antibiotic AND [2] cellulitis symptoms are BETTER (e.g., redness, pain  drainage, swelling) BUT [3] not completely gone  Answer Assessment - Initial Assessment Questions 1. SYMPTOM: "What's the main symptom you're concerned about?" (e.g., redness, swelling, pain, fever, weakness)     Most of swelling is gone, "heavy redness is gone".   2. CELLULITIS LOCATION: "Where is the cellulitis located?" (e.g., hand, arm, foot, leg, face)     Left lower leg.  3. CELLULITIS SIZE: "What is the size of the red area?" (e.g., inches, centimeters; compare to size of a coin) .     From knee down.  4. BETTER-SAME-WORSE: "Are you getting better, staying the same, or getting worse compared to the day you started the antibiotics?"      70% better than when he started.  5. PAIN: Do you have any pain?"  If Yes, ask: "How bad is the pain?"  (e.g., Scale 1-10; mild, moderate, or severe)    - MILD (1-3): Doesn't interfere with normal activities.     - MODERATE (4-7): Interferes with normal activities or awakens from sleep.    - SEVERE (8-10): Excruciating pain, unable to do any normal activities.       0/10.  6. FEVER: "Do you have a fever?" If Yes, ask: "What is it, how was it measured and when did it start?"      Denies.  7. OTHER SYMPTOMS: "Do you have any other symptoms?" (e.g., pus coming from a wound, red streaks, weakness)     Denies.  8. DIAGNOSIS DATE: "When was the cellulitis diagnosed?" "By whom?"      11/29/23. ED doctor.  9. ANTIBIOTIC NAME: "What antibiotic(s) are you taking?"  "How many times per day?" (Be sure the patient is receiving the antibiotic as directed).      Doxycycline  and cefdinir . Twice daily.  10. ANTIBIOTIC DATE: "When was the antibiotic started?"       12/07/23.  11. FOLLOW-UP APPOINTMENT: "Do you have follow-up appointment with your doctor?"       5/27 with infectious disease.  Protocols used: Cellulitis on Antibiotic Follow-up Call-A-AH

## 2023-12-15 ENCOUNTER — Encounter: Payer: Self-pay | Admitting: Nurse Practitioner

## 2023-12-15 ENCOUNTER — Ambulatory Visit: Admitting: Nurse Practitioner

## 2023-12-15 VITALS — BP 142/86 | HR 76 | Temp 98.2°F | Wt 352.0 lb

## 2023-12-15 DIAGNOSIS — Z1211 Encounter for screening for malignant neoplasm of colon: Secondary | ICD-10-CM

## 2023-12-15 DIAGNOSIS — L03116 Cellulitis of left lower limb: Secondary | ICD-10-CM

## 2023-12-15 MED ORDER — PROBIOTIC 1-250 BILLION-MG PO CAPS: 1.0000 | ORAL_CAPSULE | Freq: Every day | ORAL | 2 refills | Status: DC

## 2023-12-15 MED ORDER — LEVOFLOXACIN 500 MG PO TABS: 500.0000 mg | ORAL_TABLET | Freq: Every day | ORAL | 0 refills | Status: DC

## 2023-12-15 NOTE — Progress Notes (Unsigned)
 Subjective   Patient ID: Clayton Pena, male    DOB: 1960/10/18, 63 y.o.   MRN: [id number]  Chief Complaint  Patient presents with   Leg Swelling    Referring provider: Jerrlyn Morel, NP  Nathaneal Sommers is a 63 y.o. male with Past Medical History: 05/2019: Acute suppr otitis media w/o spon rupt ear drum, right ear No date: Angina at rest [facility]) No date: Coronary artery disease No date: DVT (deep venous thrombosis) (HCC)     Comment:  right leg No date: Hypertension No date: MI (myocardial infarction) (HCC)     Comment:  2006, 2007, 2011   HPI     Allergies  Allergen Reactions   Penicillins Anaphylaxis    Has patient had a PCN reaction causing immediate rash, facial/tongue/throat swelling, SOB or lightheadedness with hypotension: unknown Has patient had a PCN reaction causing severe rash involving mucus membranes or skin necrosis: unknown Has patient had a PCN reaction that required hospitalization: unknown Has patient had a PCN reaction occurring within the last 10 years: no If all of the above answers are "NO", then may proceed with Cephalosporin use.    Demerol  [Meperidine Hcl] Other (See Comments)    REACTION:  Increases blood pressure   Demerol [Meperidine] Other (See Comments)    REACTION:  Increases blood pressure    Immunization History  Administered Date(s) Administered   Influenza,inj,Quad PF,6+ Mos 06/15/2016, 05/25/2017, 05/16/2019   Tdap 02/22/2018    Tobacco History: Social History   Tobacco Use  Smoking Status Never  Smokeless Tobacco Never   Counseling given: Not Answered   Outpatient Encounter Medications as of 12/15/2023  Medication Sig   acetaminophen -codeine  (TYLENOL  #3) 300-30 MG tablet Take 1 tablet by mouth every 6 (six) hours as needed for moderate pain (pain score 4-6).   albuterol  (VENTOLIN  HFA) 108 (90 Base) MCG/ACT inhaler Inhale 2 puffs into the lungs every 6 (six) hours as needed for wheezing or shortness of breath.    apixaban  (ELIQUIS ) 5 MG TABS tablet Take 1 tablet (5 mg total) by mouth 2 (two) times daily. (Patient not taking: Reported on 11/22/2023)   APPLE CIDER VINEGAR PO Take by mouth.   Ascorbic Acid (VITAMIN C PO) Take by mouth.   cefdinir  (OMNICEF ) 300 MG capsule Take 1 capsule (300 mg total) by mouth 2 (two) times daily.   cetirizine  (ZYRTEC ) 10 MG tablet Take 1 tablet (10 mg total) by mouth at bedtime.   cyclobenzaprine  (FLEXERIL ) 5 MG tablet Take 1 tablet (5 mg total) by mouth 3 (three) times daily as needed for muscle spasms. (Patient not taking: Reported on 11/22/2023)   doxycycline  (VIBRAMYCIN ) 100 MG capsule Take 1 capsule (100 mg total) by mouth 2 (two) times daily.   Elastic Bandages & Supports (MEDICAL COMPRESSION STOCKINGS) MISC 1 each by Does not apply route daily. (Patient not taking: Reported on 11/22/2023)   Ferrous Sulfate  (IRON ) 325 (65 Fe) MG TABS Take 1 tablet (325 mg total) by mouth daily with breakfast.   furosemide  (LASIX ) 20 MG tablet Take 1 tablet (20 mg total) by mouth daily.   hydrochlorothiazide  (HYDRODIURIL ) 25 MG tablet Take 1 tablet (25 mg total) by mouth daily.   ibuprofen  (ADVIL ) 200 MG tablet Take 600 mg by mouth every 6 (six) hours as needed for moderate pain. (Patient not taking: Reported on 11/22/2023)   MAGNESIUM PO Take by mouth.   zinc  gluconate 50 MG tablet Take 1 tablet (50 mg total) by mouth daily.   No  facility-administered encounter medications on file as of 12/15/2023.    Review of Systems  Review of Systems  Constitutional: Negative.   HENT: Negative.    Cardiovascular: Negative.   Gastrointestinal: Negative.   Allergic/Immunologic: Negative.   Neurological: Negative.   Psychiatric/Behavioral: Negative.       Objective:   Wt (!) 352 lb (159.7 kg)   BMI 47.74 kg/m   Wt Readings from Last 5 Encounters:  12/15/23 (!) 352 lb (159.7 kg)  12/07/23 (!) 348 lb 15.8 oz (158.3 kg)  11/29/23 (!) 349 lb 3.2 oz (158.4 kg)  11/22/23 (!) 349 lb 3.2 oz  (158.4 kg)  11/16/23 (!) 339 lb 8.1 oz (154 kg)     Physical Exam Vitals and nursing note reviewed.  Constitutional:      General: He is not in acute distress.    Appearance: He is well-developed.  Cardiovascular:     Rate and Rhythm: Normal rate and regular rhythm.  Pulmonary:     Effort: Pulmonary effort is normal.     Breath sounds: Normal breath sounds.  Skin:    General: Skin is warm and dry.  Neurological:     Mental Status: He is alert and oriented to person, place, and time.     {Labs (Optional):23779}  Assessment & Plan:   Screen for colon cancer -     Cologuard     No follow-ups on file.   Jerrlyn Morel, NP 12/15/2023

## 2023-12-19 NOTE — Progress Notes (Unsigned)
 Subjective:  Reason for Infectious Disease Consult: Cellulitis in patient who received dalbavancin in ER and various other antibiotics  Requesting Physician: Annita Kindle, MD   Patient ID: Clayton Pena, male    DOB: [birth year], 63 y.o.   MRN: [id number]  HPI  Discussed the use of AI scribe software for clinical note transcription with the patient, who gave verbal consent to proceed.  History of Present Illness   Clayton Pena is a 63 year old male with a history of deep vein thrombosis who presents with recent cellulitis of the left leg.  He initially received oral cefdinir , which provided partial relief. Emergency room visits resulted in antibiotic injections, including dalbavancin, which improved the condition but left the leg remained red and hard.  He was then given ceftriaxone  and DC with levaquin  and erythema have nearly resolved.  He never has had systemic symptoms included fever, chills, and nausea.    A cystic formation behind the knee was previously sore but is now painless. The entire leg was red, with a darker red focus in one area.  He spent twelve years in [place], returning in 2010. Since then, he experiences abdominal bloating and dietary limitations, eating one meal a day without weight loss. He suspects a parasitic infection due to his time in [place], though no parasites have been observed in his stool.       Past Medical History:  Diagnosis Date   Acute suppr otitis media w/o spon rupt ear drum, right ear 05/2019   Angina at rest [facility])    Coronary artery disease    DVT (deep venous thrombosis) (HCC)    right leg   Hypertension    MI (myocardial infarction) (HCC)    2006, 2007, 2011    Past Surgical History:  Procedure Laterality Date   BACK SURGERY     CORONARY ANGIOPLASTY     HERNIA REPAIR      Family History  Problem Relation Age of Onset   Hypertension Mother    CAD Mother    Diabetes Mother    Hypertension Father    CAD Father    Prostate  cancer Father    Diabetes Brother    Stroke Neg Hx       Social History   Socioeconomic History   Marital status: Married    Spouse name: Not on file   Number of children: Not on file   Years of education: Not on file   Highest education level: Not on file  Occupational History   Not on file  Tobacco Use   Smoking status: Never   Smokeless tobacco: Never  Vaping Use   Vaping status: Never Used  Substance and Sexual Activity   Alcohol use: No    Comment: occasional   Drug use: No   Sexual activity: Yes    Partners: Female  Other Topics Concern   Not on file  Social History Narrative   Not on file   Social Drivers of Health   Financial Resource Strain: Not on file  Food Insecurity: No Food Insecurity (07/19/2023)   Hunger Vital Sign    Worried About Running Out of Food in the Last Year: Never true    Ran Out of Food in the Last Year: Never true  Transportation Needs: No Transportation Needs (07/19/2023)   PRAPARE - Administrator, Civil Service (Medical): No    Lack of Transportation (Non-Medical): No  Physical Activity: Not on file  Stress: Not on  file  Social Connections: Not on file    Allergies  Allergen Reactions   Penicillins Anaphylaxis    Has patient had a PCN reaction causing immediate rash, facial/tongue/throat swelling, SOB or lightheadedness with hypotension: unknown Has patient had a PCN reaction causing severe rash involving mucus membranes or skin necrosis: unknown Has patient had a PCN reaction that required hospitalization: unknown Has patient had a PCN reaction occurring within the last 10 years: no If all of the above answers are "NO", then may proceed with Cephalosporin use.    Demerol  [Meperidine Hcl] Other (See Comments)    REACTION:  Increases blood pressure   Demerol [Meperidine] Other (See Comments)    REACTION:  Increases blood pressure     Current Outpatient Medications:    acetaminophen -codeine  (TYLENOL  #3) 300-30  MG tablet, Take 1 tablet by mouth every 6 (six) hours as needed for moderate pain (pain score 4-6)., Disp: 10 tablet, Rfl: 0   albuterol  (VENTOLIN  HFA) 108 (90 Base) MCG/ACT inhaler, Inhale 2 puffs into the lungs every 6 (six) hours as needed for wheezing or shortness of breath., Disp: 18 g, Rfl: 3   apixaban  (ELIQUIS ) 5 MG TABS tablet, Take 1 tablet (5 mg total) by mouth 2 (two) times daily. (Patient not taking: Reported on 09/01/2023), Disp: 180 tablet, Rfl: 0   APPLE CIDER VINEGAR PO, Take by mouth., Disp: , Rfl:    Ascorbic Acid (VITAMIN C PO), Take by mouth., Disp: , Rfl:    Bacillus Coagulans-Inulin (PROBIOTIC) 1-250 BILLION-MG CAPS, Take 1 capsule by mouth daily., Disp: 30 capsule, Rfl: 2   cefdinir  (OMNICEF ) 300 MG capsule, Take 1 capsule (300 mg total) by mouth 2 (two) times daily., Disp: 14 capsule, Rfl: 0   cetirizine  (ZYRTEC ) 10 MG tablet, Take 1 tablet (10 mg total) by mouth at bedtime., Disp: 30 tablet, Rfl: 11   cyclobenzaprine  (FLEXERIL ) 5 MG tablet, Take 1 tablet (5 mg total) by mouth 3 (three) times daily as needed for muscle spasms. (Patient not taking: Reported on 12/15/2023), Disp: 15 tablet, Rfl: 0   doxycycline  (VIBRAMYCIN ) 100 MG capsule, Take 1 capsule (100 mg total) by mouth 2 (two) times daily., Disp: 14 capsule, Rfl: 0   Elastic Bandages & Supports (MEDICAL COMPRESSION STOCKINGS) MISC, 1 each by Does not apply route daily. (Patient not taking: Reported on 11/22/2023), Disp: 1 each, Rfl: 2   Ferrous Sulfate  (IRON ) 325 (65 Fe) MG TABS, Take 1 tablet (325 mg total) by mouth daily with breakfast., Disp: 90 tablet, Rfl: 0   furosemide  (LASIX ) 20 MG tablet, Take 1 tablet (20 mg total) by mouth daily., Disp: 60 tablet, Rfl: 1   hydrochlorothiazide  (HYDRODIURIL ) 25 MG tablet, Take 1 tablet (25 mg total) by mouth daily., Disp: 60 tablet, Rfl: 0   ibuprofen  (ADVIL ) 200 MG tablet, Take 600 mg by mouth every 6 (six) hours as needed for moderate pain (pain score 4-6)., Disp: , Rfl:     levofloxacin  (LEVAQUIN ) 500 MG tablet, Take 1 tablet (500 mg total) by mouth daily for 7 days., Disp: 7 tablet, Rfl: 0   MAGNESIUM PO, Take by mouth., Disp: , Rfl:    zinc  gluconate 50 MG tablet, Take 1 tablet (50 mg total) by mouth daily., Disp: 30 tablet, Rfl: 3   Past Medical History:  Diagnosis Date   Acute suppr otitis media w/o spon rupt ear drum, right ear 05/2019   Angina at rest [facility])    Coronary artery disease    DVT (deep venous  thrombosis) (HCC)    right leg   Hypertension    MI (myocardial infarction) (HCC)    2006, 2007, 2011    Past Surgical History:  Procedure Laterality Date   BACK SURGERY     CORONARY ANGIOPLASTY     HERNIA REPAIR      Family History  Problem Relation Age of Onset   Hypertension Mother    CAD Mother    Diabetes Mother    Hypertension Father    CAD Father    Prostate cancer Father    Diabetes Brother    Stroke Neg Hx       Social History   Socioeconomic History   Marital status: Married    Spouse name: Not on file   Number of children: Not on file   Years of education: Not on file   Highest education level: Not on file  Occupational History   Not on file  Tobacco Use   Smoking status: Never   Smokeless tobacco: Never  Vaping Use   Vaping status: Never Used  Substance and Sexual Activity   Alcohol use: No    Comment: occasional   Drug use: No   Sexual activity: Yes    Partners: Female  Other Topics Concern   Not on file  Social History Narrative   Not on file   Social Drivers of Health   Financial Resource Strain: Not on file  Food Insecurity: No Food Insecurity (07/19/2023)   Hunger Vital Sign    Worried About Running Out of Food in the Last Year: Never true    Ran Out of Food in the Last Year: Never true  Transportation Needs: No Transportation Needs (07/19/2023)   PRAPARE - Administrator, Civil Service (Medical): No    Lack of Transportation (Non-Medical): No  Physical Activity: Not on file   Stress: Not on file  Social Connections: Not on file    Allergies  Allergen Reactions   Penicillins Anaphylaxis    Has patient had a PCN reaction causing immediate rash, facial/tongue/throat swelling, SOB or lightheadedness with hypotension: unknown Has patient had a PCN reaction causing severe rash involving mucus membranes or skin necrosis: unknown Has patient had a PCN reaction that required hospitalization: unknown Has patient had a PCN reaction occurring within the last 10 years: no If all of the above answers are "NO", then may proceed with Cephalosporin use.    Demerol  [Meperidine Hcl] Other (See Comments)    REACTION:  Increases blood pressure   Demerol [Meperidine] Other (See Comments)    REACTION:  Increases blood pressure     Current Outpatient Medications:    acetaminophen -codeine  (TYLENOL  #3) 300-30 MG tablet, Take 1 tablet by mouth every 6 (six) hours as needed for moderate pain (pain score 4-6)., Disp: 10 tablet, Rfl: 0   albuterol  (VENTOLIN  HFA) 108 (90 Base) MCG/ACT inhaler, Inhale 2 puffs into the lungs every 6 (six) hours as needed for wheezing or shortness of breath., Disp: 18 g, Rfl: 3   apixaban  (ELIQUIS ) 5 MG TABS tablet, Take 1 tablet (5 mg total) by mouth 2 (two) times daily. (Patient not taking: Reported on 09/01/2023), Disp: 180 tablet, Rfl: 0   APPLE CIDER VINEGAR PO, Take by mouth., Disp: , Rfl:    Ascorbic Acid (VITAMIN C PO), Take by mouth., Disp: , Rfl:    Bacillus Coagulans-Inulin (PROBIOTIC) 1-250 BILLION-MG CAPS, Take 1 capsule by mouth daily., Disp: 30 capsule, Rfl: 2   cefdinir  (OMNICEF ) 300  MG capsule, Take 1 capsule (300 mg total) by mouth 2 (two) times daily., Disp: 14 capsule, Rfl: 0   cetirizine  (ZYRTEC ) 10 MG tablet, Take 1 tablet (10 mg total) by mouth at bedtime., Disp: 30 tablet, Rfl: 11   cyclobenzaprine  (FLEXERIL ) 5 MG tablet, Take 1 tablet (5 mg total) by mouth 3 (three) times daily as needed for muscle spasms. (Patient not taking:  Reported on 12/15/2023), Disp: 15 tablet, Rfl: 0   doxycycline  (VIBRAMYCIN ) 100 MG capsule, Take 1 capsule (100 mg total) by mouth 2 (two) times daily., Disp: 14 capsule, Rfl: 0   Elastic Bandages & Supports (MEDICAL COMPRESSION STOCKINGS) MISC, 1 each by Does not apply route daily. (Patient not taking: Reported on 11/22/2023), Disp: 1 each, Rfl: 2   Ferrous Sulfate  (IRON ) 325 (65 Fe) MG TABS, Take 1 tablet (325 mg total) by mouth daily with breakfast., Disp: 90 tablet, Rfl: 0   furosemide  (LASIX ) 20 MG tablet, Take 1 tablet (20 mg total) by mouth daily., Disp: 60 tablet, Rfl: 1   hydrochlorothiazide  (HYDRODIURIL ) 25 MG tablet, Take 1 tablet (25 mg total) by mouth daily., Disp: 60 tablet, Rfl: 0   ibuprofen  (ADVIL ) 200 MG tablet, Take 600 mg by mouth every 6 (six) hours as needed for moderate pain (pain score 4-6)., Disp: , Rfl:    levofloxacin  (LEVAQUIN ) 500 MG tablet, Take 1 tablet (500 mg total) by mouth daily for 7 days., Disp: 7 tablet, Rfl: 0   MAGNESIUM PO, Take by mouth., Disp: , Rfl:    zinc  gluconate 50 MG tablet, Take 1 tablet (50 mg total) by mouth daily., Disp: 30 tablet, Rfl: 3    Review of Systems  Constitutional:  Negative for activity change, appetite change, chills, diaphoresis, fatigue, fever and unexpected weight change.  HENT:  Negative for congestion, rhinorrhea, sinus pressure, sneezing, sore throat and trouble swallowing.   Eyes:  Negative for photophobia and visual disturbance.  Respiratory:  Negative for cough, chest tightness, shortness of breath, wheezing and stridor.   Cardiovascular:  Negative for chest pain, palpitations and leg swelling.  Gastrointestinal:  Positive for abdominal distention. Negative for abdominal pain, anal bleeding, blood in stool, constipation, diarrhea, nausea and vomiting.  Genitourinary:  Negative for difficulty urinating, dysuria, flank pain and hematuria.  Musculoskeletal:  Negative for arthralgias, back pain, gait problem, joint swelling  and myalgias.  Skin:  Positive for color change. Negative for pallor, rash and wound.  Neurological:  Negative for dizziness, tremors, weakness and light-headedness.  Hematological:  Negative for adenopathy. Does not bruise/bleed easily.  Psychiatric/Behavioral:  Negative for agitation, behavioral problems, confusion, decreased concentration, dysphoric mood and sleep disturbance.        Objective:   Physical Exam Constitutional:      Appearance: He is well-developed.  HENT:     Head: Normocephalic and atraumatic.  Eyes:     Conjunctiva/sclera: Conjunctivae normal.  Cardiovascular:     Rate and Rhythm: Normal rate and regular rhythm.  Pulmonary:     Effort: Pulmonary effort is normal. No respiratory distress.     Breath sounds: No wheezing.  Abdominal:     General: There is no distension.     Palpations: Abdomen is soft.  Musculoskeletal:        General: No tenderness. Normal range of motion.     Cervical back: Normal range of motion and neck supple.  Skin:    General: Skin is warm and dry.     Coloration: Skin is not pale.  Findings: No erythema or rash.  Neurological:     General: No focal deficit present.     Mental Status: He is alert and oriented to person, place, and time.  Psychiatric:        Mood and Affect: Mood normal.        Behavior: Behavior normal.        Thought Content: Thought content normal.        Judgment: Judgment normal.           Assessment & Plan:   Assessment and Plan    Cellulitis of left leg I would suspect that the entire time he had a streptococcal infection and did not require all of the broad spectrum antibiotics he received nor the duration. 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 --if he has recurrence would target with cefadroxil to cover strep and MSSA  Venous stasis Venous stasis  with hyperpigmentation, potential confounder in cellulitis assessment. Explained chronic discoloration and irreversibility.  Deep vein thrombosis (DVT) History of DVT, recent leg swelling and redness, no new DVT confirmed.  Concerns about parasitic infection Concerns due to past residence in [place], symptoms include swelling and reduced diet. No visible parasites in stool. - Provide stool sample kit for parasitic testing. - Recommend follow-up with a gastroenterologist for further evaluation.      I have personally spent 80 minutes involved in face-to-face and non-face-to-face activities for this patient on the day of the visit. Professional time spent includes the following activities: Preparing to see the patient cultures CBC CMP ultrasounds obtaining and/or reviewing separately obtained history recent ER visits primary care physician visits performing a medically appropriate examination and/or evaluation ,Referring and communicating with other health care professionals, Documenting clinical information in the EMR, Independently interpreting results (not separately reported), Communicating results to the patient/, Counseling and educating the patient/family/caregiver and Care coordination (not separately reported).   In the assessment and plan what send Levemir she has had levofloxacin 

## 2023-12-20 ENCOUNTER — Other Ambulatory Visit: Payer: Self-pay

## 2023-12-20 ENCOUNTER — Ambulatory Visit: Admitting: Infectious Disease

## 2023-12-20 ENCOUNTER — Encounter: Payer: Self-pay | Admitting: Infectious Disease

## 2023-12-20 VITALS — BP 152/88 | HR 67 | Temp 98.0°F | Ht 70.5 in | Wt 348.0 lb

## 2023-12-20 DIAGNOSIS — R140 Abdominal distension (gaseous): Secondary | ICD-10-CM | POA: Insufficient documentation

## 2023-12-20 DIAGNOSIS — L03116 Cellulitis of left lower limb: Secondary | ICD-10-CM | POA: Diagnosis not present

## 2023-12-20 DIAGNOSIS — I878 Other specified disorders of veins: Secondary | ICD-10-CM

## 2023-12-20 DIAGNOSIS — O2230 Deep phlebothrombosis in pregnancy, unspecified trimester: Secondary | ICD-10-CM

## 2023-12-20 DIAGNOSIS — I824Z9 Acute embolism and thrombosis of unspecified deep veins of unspecified distal lower extremity: Secondary | ICD-10-CM

## 2023-12-20 HISTORY — DX: Abdominal distension (gaseous): R14.0

## 2023-12-21 ENCOUNTER — Encounter: Payer: Self-pay | Admitting: Nurse Practitioner

## 2023-12-21 NOTE — Patient Instructions (Signed)
 1. Screen for colon cancer (Primary)  - Cologuard  2. Cellulitis of left lower extremity  - levofloxacin  (LEVAQUIN ) 500 MG tablet; Take 1 tablet (500 mg total) by mouth daily for 7 days.  Dispense: 7 tablet; Refill: 0

## 2023-12-22 ENCOUNTER — Telehealth: Payer: Self-pay

## 2023-12-22 ENCOUNTER — Other Ambulatory Visit: Payer: Self-pay | Admitting: Nurse Practitioner

## 2023-12-22 DIAGNOSIS — L03116 Cellulitis of left lower limb: Secondary | ICD-10-CM

## 2023-12-22 MED ORDER — LEVOFLOXACIN 500 MG PO TABS: 500.0000 mg | ORAL_TABLET | Freq: Every day | ORAL | 0 refills | Status: AC

## 2023-12-22 NOTE — Telephone Encounter (Signed)
 Copied from CRM [id number]. Topic: Clinical - Medication Question >> Dec 21, 2023  5:31 PM Clayton Pena wrote: Reason for CRM: patient states he is doing great with the levofloxacin  (LEVAQUIN ) 500 MG tablet, req fill be sent in. Ty  [id number] - [place], [place] - [address]

## 2023-12-27 NOTE — Telephone Encounter (Unsigned)
 Copied from CRM [id number]. Topic: Clinical - Medication Question >> Dec 21, 2023  5:31 PM Sophia H wrote: Reason for CRM: patient states he is doing great with the levofloxacin  (LEVAQUIN ) 500 MG tablet, req fill be sent in. Ty  [id number] - [place], [place] [address] >> Dec 27, 2023  3:48 PM Emylou G wrote: Patient called said took all 3 days of refill.. 95% of the infection is gone, but still around the ankle and heel?Aaron Aas. Please advise him if needs more medication or another protocol

## 2023-12-29 ENCOUNTER — Encounter (HOSPITAL_COMMUNITY): Payer: Self-pay

## 2023-12-29 ENCOUNTER — Emergency Department (HOSPITAL_COMMUNITY)
Admission: EM | Admit: 2023-12-29 | Discharge: 2023-12-29 | Disposition: A | Discharge status: Home / Self Care | Attending: Emergency Medicine | Admitting: Emergency Medicine

## 2023-12-29 ENCOUNTER — Other Ambulatory Visit: Payer: Self-pay

## 2023-12-29 DIAGNOSIS — M79605 Pain in left leg: Secondary | ICD-10-CM | POA: Diagnosis present

## 2023-12-29 DIAGNOSIS — I10 Essential (primary) hypertension: Secondary | ICD-10-CM | POA: Diagnosis not present

## 2023-12-29 DIAGNOSIS — L03116 Cellulitis of left lower limb: Secondary | ICD-10-CM | POA: Insufficient documentation

## 2023-12-29 DIAGNOSIS — Z955 Presence of coronary angioplasty implant and graft: Secondary | ICD-10-CM | POA: Diagnosis not present

## 2023-12-29 DIAGNOSIS — I2510 Atherosclerotic heart disease of native coronary artery without angina pectoris: Secondary | ICD-10-CM | POA: Diagnosis not present

## 2023-12-29 DIAGNOSIS — Z79899 Other long term (current) drug therapy: Secondary | ICD-10-CM | POA: Diagnosis not present

## 2023-12-29 DIAGNOSIS — Z7901 Long term (current) use of anticoagulants: Secondary | ICD-10-CM | POA: Diagnosis not present

## 2023-12-29 DIAGNOSIS — Z86718 Personal history of other venous thrombosis and embolism: Secondary | ICD-10-CM | POA: Diagnosis not present

## 2023-12-29 HISTORY — DX: Cellulitis, unspecified: L03.90

## 2023-12-29 LAB — BASIC METABOLIC PANEL WITH GFR
Anion gap: 11 (ref 5–15)
BUN: 22 mg/dL (ref 8–23)
CO2: 26 mmol/L (ref 22–32)
Calcium: 8.5 mg/dL — ABNORMAL LOW (ref 8.9–10.3)
Chloride: 104 mmol/L (ref 98–111)
Creatinine, Ser: 0.97 mg/dL (ref 0.61–1.24)
GFR, Estimated: >60 mL/min (ref >60)
Glucose, Bld: 122 mg/dL — ABNORMAL HIGH (ref 70–99)
Potassium: 3.3 mmol/L — ABNORMAL LOW (ref 3.5–5.1)
Sodium: 141 mmol/L (ref 135–145)

## 2023-12-29 LAB — CBC WITH DIFFERENTIAL/PLATELET
Abs Immature Granulocytes: 0.1 10*3/uL — ABNORMAL HIGH (ref 0.00–0.07)
Basophils Absolute: 0.1 10*3/uL (ref 0.0–0.1)
Basophils Relative: 1 %
Eosinophils Absolute: 0 10*3/uL (ref 0.0–0.5)
Eosinophils Relative: 0 %
HCT: 45.3 % (ref 39.0–52.0)
Hemoglobin: 14.8 g/dL (ref 13.0–17.0)
Immature Granulocytes: 1 %
Lymphocytes Relative: 18 %
Lymphs Abs: 2.7 10*3/uL (ref 0.7–4.0)
MCH: 29.1 pg (ref 26.0–34.0)
MCHC: 32.7 g/dL (ref 30.0–36.0)
MCV: 89.2 fL (ref 80.0–100.0)
Monocytes Absolute: 1.3 10*3/uL — ABNORMAL HIGH (ref 0.1–1.0)
Monocytes Relative: 8 %
Neutro Abs: 11 10*3/uL — ABNORMAL HIGH (ref 1.7–7.7)
Neutrophils Relative %: 72 %
Platelets: 222 10*3/uL (ref 150–400)
RBC: 5.08 MIL/uL (ref 4.22–5.81)
RDW: 13.1 % (ref 11.5–15.5)
WBC: 15.1 10*3/uL — ABNORMAL HIGH (ref 4.0–10.5)
nRBC: 0 % (ref 0.0–0.2)

## 2023-12-29 MED ORDER — DEXTROSE 5 % IV SOLN
1500.0000 mg | Freq: Once | INTRAVENOUS | Status: AC
  Administered 2023-12-29: 1500 mg via INTRAVENOUS
  Filled 2023-12-29: qty 75

## 2023-12-29 MED ORDER — POTASSIUM CHLORIDE CRYS ER 20 MEQ PO TBCR
40.0000 meq | EXTENDED_RELEASE_TABLET | Freq: Once | ORAL | Status: AC
  Administered 2023-12-29: 40 meq via ORAL
  Filled 2023-12-29: qty 2

## 2023-12-29 MED ORDER — ONDANSETRON HCL 4 MG/2ML IJ SOLN
4.0000 mg | Freq: Once | INTRAMUSCULAR | Status: AC
  Administered 2023-12-29: 4 mg via INTRAVENOUS
  Filled 2023-12-29: qty 2

## 2023-12-29 NOTE — ED Triage Notes (Signed)
 Pt arrived via POV from home c/o on-going cellulitis in his left lower extremity. Pt reports calling Infectious Disease and being advised to go to the ER for further evaluation due to not being able to see the Pt for 3 weeks.

## 2023-12-29 NOTE — Discharge Instructions (Signed)
 We evaluated you for your skin infection (cellulitis).  We have given you a dose of Dalvance , which is an antibiotic that will last in your system for over a week.  We are hopeful this will help resolve your skin infection.  Please continue to follow-up with your primary doctor and infectious disease.  Please keep your leg elevated as much as possible as this will help with the swelling.  Return if you develop any worsening symptoms such as fevers, worsening or severe pain, wound on your leg, or any other concerning symptoms.

## 2023-12-29 NOTE — ED Provider Notes (Signed)
 [place] EMERGENCY DEPARTMENT AT [facility] Provider Note  CSN: [id number] Arrival date & time: 12/29/23 1545  Chief Complaint(s) Cellulitis  HPI Clayton Pena is a 63 y.o. male history of prior DVT on Eliquis , obesity presenting to the emergency department left lower extremity pain.  Patient reports that he has been dealing with redness and pain left lower extremity for few weeks now.  Initially was treated with oral antibiotics and had some improvement but was persistent, came back to the emergency department and received Dalvance  which did significantly improve his symptoms, had some slight worsening and had been on levofloxacin  also.  Over the past few days has been worsening since being off antibiotics.  Was asked to return to the emergency department by ID who he had seen.  Was told he may need IV antibiotics.  Reports symptoms are consistent with prior to site of cellulitis.  No wounds.  No injuries.  No fevers or chills.   Past Medical History Past Medical History:  Diagnosis Date   Abdominal distension 12/20/2023   Acute suppr otitis media w/o spon rupt ear drum, right ear 05/2019   Angina at rest [facility])    Cellulitis    Coronary artery disease    DVT (deep venous thrombosis) (HCC)    right leg   Hypertension    MI (myocardial infarction) (HCC)    2006, 2007, 2011   Patient Active Problem List   Diagnosis Date Noted   Abdominal distension 12/20/2023   Bilateral lower extremity edema 11/22/2023   Encounter for examination following treatment at hospital 11/22/2023   Subcutaneous cyst 11/22/2023   Leukocytosis 07/18/2023   Prediabetes 04/13/2022   Hypogonadism, male 04/13/2022   Healthcare maintenance 11/13/2021   Transaminitis 11/08/2019   Long term current use of anticoagulant 08/15/2019   Impaired functional mobility, balance, gait, and endurance 08/15/2019   Acute gout of right foot 06/25/2019   Class 3 severe obesity due to excess calories with serious  comorbidity and body mass index (BMI) of 40.0 to 44.9 in adult 06/25/2019   Acute suppurative otitis media of right ear with spontaneous rupture of tympanic membrane 05/28/2019   Right thigh pain 04/17/2019   History of DVT (deep vein thrombosis) 08/22/2017   Anticoagulated 08/22/2017   Essential hypertension, benign 05/25/2017   Morbid obesity with BMI of 40.0-44.9, adult (HCC) 05/25/2017   Blood pressure check 02/08/2017   Morbid obesity (HCC) 01/12/2017   Acute venous embolism and thrombosis of deep vessels of proximal end of left lower extremity (HCC) 07/09/2016   CAD (coronary artery disease) 06/14/2016   Cellulitis 06/14/2016   Phlebitis 06/13/2016   Home Medication(s) Prior to Admission medications   Medication Sig Start Date End Date Taking? Authorizing Provider  acetaminophen -codeine  (TYLENOL  #3) 300-30 MG tablet Take 1 tablet by mouth every 6 (six) hours as needed for moderate pain (pain score 4-6). 11/17/23   Jerrlyn Morel, NP  albuterol  (VENTOLIN  HFA) 108 (90 Base) MCG/ACT inhaler Inhale 2 puffs into the lungs every 6 (six) hours as needed for wheezing or shortness of breath. 07/09/22   Jerrlyn Morel, NP  apixaban  (ELIQUIS ) 5 MG TABS tablet Take 1 tablet (5 mg total) by mouth 2 (two) times daily. Patient not taking: Reported on 12/20/2023 09/01/23   Jerrlyn Morel, NP  APPLE CIDER VINEGAR PO Take by mouth.    [provider]  Ascorbic Acid (VITAMIN C PO) Take by mouth.    [provider]  Bacillus Coagulans-Inulin (PROBIOTIC) 1-250  BILLION-MG CAPS Take 1 capsule by mouth daily. Patient not taking: Reported on 12/20/2023 12/15/23   Jerrlyn Morel, NP  cefdinir  (OMNICEF ) 300 MG capsule Take 1 capsule (300 mg total) by mouth 2 (two) times daily. Patient not taking: Reported on 12/20/2023 12/07/23   Tonya Fredrickson, MD  cetirizine  (ZYRTEC ) 10 MG tablet Take 1 tablet (10 mg total) by mouth at bedtime. 02/12/22   Nichols, Tonya S, NP  cyclobenzaprine  (FLEXERIL )  5 MG tablet Take 1 tablet (5 mg total) by mouth 3 (three) times daily as needed for muscle spasms. Patient not taking: Reported on 11/22/2023 09/27/22   Idol, Julie, PA-C  doxycycline  (VIBRAMYCIN ) 100 MG capsule Take 1 capsule (100 mg total) by mouth 2 (two) times daily. Patient not taking: Reported on 12/20/2023 12/07/23   Tonya Fredrickson, MD  Elastic Bandages & Supports (MEDICAL COMPRESSION STOCKINGS) MISC 1 each by Does not apply route daily. Patient not taking: Reported on 11/22/2023 12/07/16   Sigurd Driver, FNP  Ferrous Sulfate  (IRON ) 325 (65 Fe) MG TABS Take 1 tablet (325 mg total) by mouth daily with breakfast. 09/01/23   Nichols, Tonya S, NP  furosemide  (LASIX ) 20 MG tablet Take 1 tablet (20 mg total) by mouth daily. 10/17/23   Paseda, Folashade R, FNP  hydrochlorothiazide  (HYDRODIURIL ) 25 MG tablet Take 1 tablet (25 mg total) by mouth daily. 09/01/23   Jerrlyn Morel, NP  ibuprofen  (ADVIL ) 200 MG tablet Take 600 mg by mouth every 6 (six) hours as needed for moderate pain (pain score 4-6).    [provider]  MAGNESIUM PO Take by mouth.    [provider]  zinc  gluconate 50 MG tablet Take 1 tablet (50 mg total) by mouth daily. 02/27/18   Emmie Harness, FNP                                                                                                                                    Past Surgical History Past Surgical History:  Procedure Laterality Date   BACK SURGERY     CORONARY ANGIOPLASTY     HERNIA REPAIR     Family History Family History  Problem Relation Age of Onset   Hypertension Mother    CAD Mother    Diabetes Mother    Hypertension Father    CAD Father    Prostate cancer Father    Diabetes Brother    Stroke Neg Hx     Social History Social History   Tobacco Use   Smoking status: Never   Smokeless tobacco: Never  Vaping Use   Vaping status: Never Used  Substance Use Topics   Alcohol use: Yes    Comment: occasional   Drug use: No    Allergies Penicillins, Demerol  [meperidine hcl], and Demerol [meperidine]  Review of Systems Review of Systems  All other systems reviewed and are negative.   Physical Exam Vital Signs  I have reviewed the triage vital signs BP (!) 153/78   Pulse 72   Temp 97.9 F (36.6 C) (Oral)   Resp 20   Ht 5' 10.5" (1.791 m)   Wt (!) 157.9 kg   SpO2 96%   BMI 49.23 kg/m  Physical Exam Vitals and nursing note reviewed.  Constitutional:      General: He is not in acute distress.    Appearance: Normal appearance.  HENT:     Mouth/Throat:     Mouth: Mucous membranes are moist.  Eyes:     Conjunctiva/sclera: Conjunctivae normal.  Cardiovascular:     Rate and Rhythm: Normal rate and regular rhythm.     Pulses:          Dorsalis pedis pulses are 2+ on the right side and 2+ on the left side.  Pulmonary:     Effort: Pulmonary effort is normal. No respiratory distress.     Breath sounds: Normal breath sounds.  Abdominal:     General: Abdomen is flat.     Palpations: Abdomen is soft.     Tenderness: There is no abdominal tenderness.  Musculoskeletal:     Right lower leg: No edema.     Left lower leg: Edema (erythema, warmth from upper shin down) present.  Skin:    General: Skin is warm and dry.     Capillary Refill: Capillary refill takes less than 2 seconds.  Neurological:     Mental Status: He is alert and oriented to person, place, and time. Mental status is at baseline.  Psychiatric:        Mood and Affect: Mood normal.        Behavior: Behavior normal.     ED Results and Treatments Labs (all labs ordered are listed, but only abnormal results are displayed) Labs Reviewed  BASIC METABOLIC PANEL WITH GFR - Abnormal; Notable for the following components:      Result Value   Potassium 3.3 (*)    Glucose, Bld 122 (*)    Calcium 8.5 (*)    All other components within normal limits  CBC WITH DIFFERENTIAL/PLATELET - Abnormal; Notable for the following components:   WBC  15.1 (*)    Neutro Abs 11.0 (*)    Monocytes Absolute 1.3 (*)    Abs Immature Granulocytes 0.10 (*)    All other components within normal limits                                                                                                                          Radiology No results found.  Pertinent labs & imaging results that were available during my care of the patient were reviewed by me and considered in my medical decision making (see MDM for details).  Medications Ordered in ED Medications  dalbavancin (DALVANCE ) 1,500 mg in dextrose  5 % 500 mL IVPB (has no administration in time range)  potassium chloride  SA (KLOR-CON  M) CR tablet 40 mEq (has no  administration in time range)  ondansetron  (ZOFRAN ) injection 4 mg (4 mg Intravenous Given 12/29/23 2114)                                                                                                                                     Procedures Procedures  (including critical care time)  Medical Decision Making / ED Course   MDM:  63 year old presenting to the emergency department with redness to the leg.  Examination with redness in the leg.  Seems consistent with previously diagnosed cellulitis with some worsening after finishing oral antibiotics.  Patient has had multiple antibiotic trials and has had some episodes of improvement but symptoms persist.  He did report significant improvement Dalvance , and he would prefer not to be admitted to the hospital.  Will check basic labs, consider redosing this.  Consider other process such as DVT or osteomyelitis, necrotizing infection but no wound, no crepitus, patient on chronic Eliquis .  Clinical Course as of 12/29/23 2226  Thu Dec 29, 2023  2223 Labs with mild leukocytosis, mild hypokalemia.  Will replete potassium.  I have ordered Dalvance .  Believe patient is stable for discharge following Dalvance  infusion.  Recommend continued outpatient follow-up, elevation of leg and strict  ER return precautions.  Patient comfortable with the plan. Will discharge patient to home. All questions answered. Patient comfortable with plan of discharge. Return precautions discussed with patient and specified on the after visit summary.  [WS]    Clinical Course User Index [WS] Isaiah Marc, Dozier Genre, MD     Additional history obtained: -Additional history obtained from family -External records from outside source obtained and reviewed including: Chart review including previous notes, labs, imaging, consultation notes including prior visits for similar    Lab Tests: -I ordered, reviewed, and interpreted labs.   The pertinent results include:   Labs Reviewed  BASIC METABOLIC PANEL WITH GFR - Abnormal; Notable for the following components:      Result Value   Potassium 3.3 (*)    Glucose, Bld 122 (*)    Calcium 8.5 (*)    All other components within normal limits  CBC WITH DIFFERENTIAL/PLATELET - Abnormal; Notable for the following components:   WBC 15.1 (*)    Neutro Abs 11.0 (*)    Monocytes Absolute 1.3 (*)    Abs Immature Granulocytes 0.10 (*)    All other components within normal limits    Notable for leukocytosis     Medicines ordered and prescription drug management: Meds ordered this encounter  Medications   ondansetron  (ZOFRAN ) injection 4 mg   dalbavancin (DALVANCE ) 1,500 mg in dextrose  5 % 500 mL IVPB    Indication::   Cellulitis   potassium chloride  SA (KLOR-CON  M) CR tablet 40 mEq    -I have reviewed the patients home medicines and have made adjustments as needed    Cardiac Monitoring: The patient was maintained on a cardiac monitor.  I personally viewed and interpreted the cardiac monitored which showed an underlying rhythm of: NSR  Social Determinants of Health:  Diagnosis or treatment significantly limited by social determinants of health: obesity   Reevaluation: After the interventions noted above, I reevaluated the patient and found that their  symptoms have improved  Co morbidities that complicate the patient evaluation  Past Medical History:  Diagnosis Date   Abdominal distension 12/20/2023   Acute suppr otitis media w/o spon rupt ear drum, right ear 05/2019   Angina at rest [facility])    Cellulitis    Coronary artery disease    DVT (deep venous thrombosis) (HCC)    right leg   Hypertension    MI (myocardial infarction) (HCC)    2006, 2007, 2011      Dispostion: Disposition decision including need for hospitalization was considered, and patient discharged from emergency department.    Final Clinical Impression(s) / ED Diagnoses Final diagnoses:  Cellulitis of left lower leg     This chart was dictated using voice recognition software.  Despite best efforts to proofread,  errors can occur which can change the documentation meaning.    Mordecai Applebaum, MD 12/29/23 2226

## 2024-01-02 ENCOUNTER — Ambulatory Visit: Payer: Self-pay | Admitting: Nurse Practitioner

## 2024-01-24 ENCOUNTER — Ambulatory Visit: Admitting: Nurse Practitioner

## 2024-01-24 ENCOUNTER — Encounter: Payer: Self-pay | Admitting: Nurse Practitioner

## 2024-01-24 VITALS — BP 112/59 | HR 93 | Temp 98.0°F | Wt 350.4 lb

## 2024-01-24 DIAGNOSIS — Z86718 Personal history of other venous thrombosis and embolism: Secondary | ICD-10-CM

## 2024-01-24 DIAGNOSIS — E876 Hypokalemia: Secondary | ICD-10-CM | POA: Diagnosis not present

## 2024-01-24 DIAGNOSIS — L03116 Cellulitis of left lower limb: Secondary | ICD-10-CM

## 2024-01-24 DIAGNOSIS — M25571 Pain in right ankle and joints of right foot: Secondary | ICD-10-CM | POA: Insufficient documentation

## 2024-01-24 DIAGNOSIS — R600 Localized edema: Secondary | ICD-10-CM | POA: Diagnosis not present

## 2024-01-24 MED ORDER — LEVOFLOXACIN 500 MG PO TABS: 500.0000 mg | ORAL_TABLET | Freq: Every day | ORAL | 0 refills | Status: AC

## 2024-01-24 MED ORDER — LEVOFLOXACIN 500 MG PO TABS: 500.0000 mg | ORAL_TABLET | Freq: Every day | ORAL | 0 refills | Status: DC

## 2024-01-24 NOTE — Progress Notes (Addendum)
 Established Patient Office Visit  Subjective:  Patient ID: Clayton Pena, male    DOB: 12-12-1960  Age: 63 y.o. MRN: [id number]  CC:  Chief Complaint  Patient presents with   Leg Pain    HPI Clayton Pena is a 63 y.o. male  has a past medical history of Abdominal distension (12/20/2023), Acute suppr otitis media w/o spon rupt ear drum, right ear (05/2019), Angina at rest [facility]), Cellulitis, Coronary artery disease, DVT (deep venous thrombosis) (HCC), Hypertension, and MI (myocardial infarction) (HCC).   Patient presents with complaints of redness and pain of bilateral lower extremities since last week.Clayton Pena  He continues to have recurrent cellulitis of left leg has been to the emergency department at different times,During his last visit to the emergency department on June fifth 2025 he received Dalvance  IV infusion, stated that he felt better after the infusions. also saw infectious disease specialist in May,they had recommended treating patient with cefadroxil to cover for strep and MRSA if he has recurrence cellulitis but the patient is allergic to penicillin.  Pain is currently rated 8 on the right leg and 10 on the left ankle. He denies fever, chills, injuries    Past Medical History:  Diagnosis Date   Abdominal distension 12/20/2023   Acute suppr otitis media w/o spon rupt ear drum, right ear 05/2019   Angina at rest [facility])    Cellulitis    Coronary artery disease    DVT (deep venous thrombosis) (HCC)    right leg   Hypertension    MI (myocardial infarction) (HCC)    2006, 2007, 2011    Past Surgical History:  Procedure Laterality Date   BACK SURGERY     CORONARY ANGIOPLASTY     HERNIA REPAIR      Family History  Problem Relation Age of Onset   Hypertension Mother    CAD Mother    Diabetes Mother    Hypertension Father    CAD Father    Prostate cancer Father    Diabetes Brother    Stroke Neg Hx     Social History   Socioeconomic History   Marital status:  Divorced    Spouse name: Not on file   Number of children: Not on file   Years of education: Not on file   Highest education level: Not on file  Occupational History   Not on file  Tobacco Use   Smoking status: Never   Smokeless tobacco: Never  Vaping Use   Vaping status: Never Used  Substance and Sexual Activity   Alcohol use: Yes    Comment: occasional   Drug use: No   Sexual activity: Yes    Partners: Female  Other Topics Concern   Not on file  Social History Narrative   Not on file   Social Drivers of Health   Financial Resource Strain: Not on file  Food Insecurity: No Food Insecurity (07/19/2023)   Hunger Vital Sign    Worried About Running Out of Food in the Last Year: Never true    Ran Out of Food in the Last Year: Never true  Transportation Needs: No Transportation Needs (07/19/2023)   PRAPARE - Administrator, Civil Service (Medical): No    Lack of Transportation (Non-Medical): No  Physical Activity: Not on file  Stress: Not on file  Social Connections: Not on file  Intimate Partner Violence: Not At Risk (07/19/2023)   Humiliation, Afraid, Rape, and Kick questionnaire    Fear of  Current or Ex-Partner: No    Emotionally Abused: No    Physically Abused: No    Sexually Abused: No    Outpatient Medications Prior to Visit  Medication Sig Dispense Refill   acetaminophen -codeine  (TYLENOL  #3) 300-30 MG tablet Take 1 tablet by mouth every 6 (six) hours as needed for moderate pain (pain score 4-6). 10 tablet 0   albuterol  (VENTOLIN  HFA) 108 (90 Base) MCG/ACT inhaler Inhale 2 puffs into the lungs every 6 (six) hours as needed for wheezing or shortness of breath. 18 g 3   APPLE CIDER VINEGAR PO Take by mouth.     Ascorbic Acid (VITAMIN C PO) Take by mouth.     cetirizine  (ZYRTEC ) 10 MG tablet Take 1 tablet (10 mg total) by mouth at bedtime. 30 tablet 11   Ferrous Sulfate  (IRON ) 325 (65 Fe) MG TABS Take 1 tablet (325 mg total) by mouth daily with breakfast.  90 tablet 0   furosemide  (LASIX ) 20 MG tablet Take 1 tablet (20 mg total) by mouth daily. 60 tablet 1   hydrochlorothiazide  (HYDRODIURIL ) 25 MG tablet Take 1 tablet (25 mg total) by mouth daily. 60 tablet 0   MAGNESIUM PO Take by mouth.     zinc  gluconate 50 MG tablet Take 1 tablet (50 mg total) by mouth daily. 30 tablet 3   apixaban  (ELIQUIS ) 5 MG TABS tablet Take 1 tablet (5 mg total) by mouth 2 (two) times daily. (Patient not taking: Reported on 12/20/2023) 180 tablet 0   Bacillus Coagulans-Inulin (PROBIOTIC) 1-250 BILLION-MG CAPS Take 1 capsule by mouth daily. (Patient not taking: Reported on 12/20/2023) 30 capsule 2   cefdinir  (OMNICEF ) 300 MG capsule Take 1 capsule (300 mg total) by mouth 2 (two) times daily. (Patient not taking: Reported on 12/20/2023) 14 capsule 0   cyclobenzaprine  (FLEXERIL ) 5 MG tablet Take 1 tablet (5 mg total) by mouth 3 (three) times daily as needed for muscle spasms. (Patient not taking: Reported on 11/22/2023) 15 tablet 0   Elastic Bandages & Supports (MEDICAL COMPRESSION STOCKINGS) MISC 1 each by Does not apply route daily. (Patient not taking: Reported on 11/22/2023) 1 each 2   ibuprofen  (ADVIL ) 200 MG tablet Take 600 mg by mouth every 6 (six) hours as needed for moderate pain (pain score 4-6). (Patient not taking: Reported on 01/24/2024)     doxycycline  (VIBRAMYCIN ) 100 MG capsule Take 1 capsule (100 mg total) by mouth 2 (two) times daily. (Patient not taking: Reported on 12/20/2023) 14 capsule 0   No facility-administered medications prior to visit.    Allergies  Allergen Reactions   Penicillins Anaphylaxis    Has patient had a PCN reaction causing immediate rash, facial/tongue/throat swelling, SOB or lightheadedness with hypotension: unknown Has patient had a PCN reaction causing severe rash involving mucus membranes or skin necrosis: unknown Has patient had a PCN reaction that required hospitalization: unknown Has patient had a PCN reaction occurring within the  last 10 years: no If all of the above answers are NO, then may proceed with Cephalosporin use.    Demerol  [Meperidine Hcl] Other (See Comments)    REACTION:  Increases blood pressure   Demerol [Meperidine] Other (See Comments)    REACTION:  Increases blood pressure    ROS Review of Systems  Constitutional:  Negative for appetite change, chills, fatigue and fever.  HENT:  Negative for congestion, postnasal drip, rhinorrhea and sneezing.   Respiratory:  Negative for cough, shortness of breath and wheezing.   Cardiovascular:  Negative  for chest pain, palpitations and leg swelling.  Gastrointestinal:  Negative for abdominal pain, constipation, nausea and vomiting.  Genitourinary:  Negative for difficulty urinating, dysuria, flank pain and frequency.  Musculoskeletal:  Negative for joint swelling and myalgias.  Skin:  Negative for color change, pallor, rash and wound.  Neurological:  Negative for dizziness, facial asymmetry, weakness, numbness and headaches.  Psychiatric/Behavioral:  Negative for behavioral problems, confusion, self-injury and suicidal ideas.       Objective:    Physical Exam Vitals and nursing note reviewed.  Constitutional:      General: He is not in acute distress.    Appearance: Normal appearance. He is obese. He is not ill-appearing, toxic-appearing or diaphoretic.   Eyes:     General: No scleral icterus.       Right eye: No discharge.        Left eye: No discharge.     Extraocular Movements: Extraocular movements intact.     Conjunctiva/sclera: Conjunctivae normal.    Cardiovascular:     Rate and Rhythm: Normal rate and regular rhythm.     Pulses: Normal pulses.     Heart sounds: Normal heart sounds. No murmur heard.    No friction rub. No gallop.  Pulmonary:     Effort: Pulmonary effort is normal. No respiratory distress.     Breath sounds: Normal breath sounds. No stridor. No wheezing, rhonchi or rales.  Chest:     Chest wall: No tenderness.   Abdominal:     General: There is no distension.     Palpations: Abdomen is soft.     Tenderness: There is no abdominal tenderness. There is no right CVA tenderness, left CVA tenderness or guarding.   Musculoskeletal:        General: Tenderness present. No swelling, deformity or signs of injury.     Right lower leg: Edema present.     Left lower leg: Edema present.     Comments: Bilateral lower extremity edema with erythema, warmth   Skin:    General: Skin is warm and dry.     Capillary Refill: Capillary refill takes less than 2 seconds.     Coloration: Skin is not jaundiced or pale.     Findings: No bruising, erythema or lesion.   Neurological:     Mental Status: He is alert and oriented to person, place, and time.     Motor: No weakness.     Coordination: Coordination normal.     Gait: Gait normal.   Psychiatric:        Mood and Affect: Mood normal.        Behavior: Behavior normal.        Thought Content: Thought content normal.        Judgment: Judgment normal.     BP (!) 112/59   Pulse 93   Temp 98 F (36.7 C) (Oral)   Wt (!) 350 lb 6.4 oz (158.9 kg)   SpO2 96%   BMI 49.57 kg/m  Wt Readings from Last 3 Encounters:  01/24/24 (!) 350 lb 6.4 oz (158.9 kg)  12/29/23 (!) 348 lb (157.9 kg)  12/20/23 (!) 348 lb (157.9 kg)    Lab Results  Component Value Date   TSH 2.010 07/04/2023   Lab Results  Component Value Date   WBC 15.1 (H) 12/29/2023   HGB 14.8 12/29/2023   HCT 45.3 12/29/2023   MCV 89.2 12/29/2023   PLT 222 12/29/2023   Lab Results  Component Value Date  NA 141 12/29/2023   K 3.3 (L) 12/29/2023   CO2 26 12/29/2023   GLUCOSE 122 (H) 12/29/2023   BUN 22 12/29/2023   CREATININE 0.97 12/29/2023   BILITOT 0.4 07/04/2023   ALKPHOS 51 07/04/2023   AST 22 07/04/2023   ALT 27 07/04/2023   PROT 6.1 07/04/2023   ALBUMIN 3.8 (L) 07/04/2023   CALCIUM 8.5 (L) 12/29/2023   ANIONGAP 11 12/29/2023   EGFR 96 07/04/2023   Lab Results  Component Value  Date   CHOL 113 07/04/2023   Lab Results  Component Value Date   HDL 37 (L) 07/04/2023   Lab Results  Component Value Date   LDLCALC 61 07/04/2023   Lab Results  Component Value Date   TRIG 75 07/04/2023   Lab Results  Component Value Date   CHOLHDL 3.1 07/04/2023   Lab Results  Component Value Date   HGBA1C 6.0 04/27/2022      Assessment & Plan:   Problem List Items Addressed This Visit       Other   Cellulitis - Primary   Advised to follow-up with infectious disease specialist if no improvement Use of compression socks, elevation DASH diet for edema discussed Continue Tylenol  3 as needed for pain - levofloxacin  (LEVAQUIN ) 500 MG tablet; Take 1 tablet (500 mg total) by mouth daily for 7 days.  Dispense: 7 tablet; Refill: 0        Relevant Medications   levofloxacin  (LEVAQUIN ) 500 MG tablet   History of DVT (deep vein thrombosis)   Has a prescription for Eliquis  but not taking medication      Bilateral lower extremity edema   On hydrochlorothiazide  and furosemide  Use of compression socks, elevation DASH diet for edema discussed      Hypokalemia   Lab Results  Component Value Date   NA 141 12/29/2023   K 3.3 (L) 12/29/2023   CO2 26 12/29/2023   GLUCOSE 122 (H) 12/29/2023   BUN 22 12/29/2023   CREATININE 0.97 12/29/2023   CALCIUM 8.5 (L) 12/29/2023   EGFR 96 07/04/2023   GFRNONAA >60 12/29/2023  Received potassium supplement at emergency department Currently on hydrochlorothiazide  and furosemide , not on potassium supplement Rechecking labs      Relevant Orders   Basic Metabolic Panel   Right ankle pain   Does not want to take NSAIDs Stated that only Tylenol  3 helps his pain Continue Tylenol  3 1 tablet every 6 hours as needed       Meds ordered this encounter  Medications   DISCONTD: levofloxacin  (LEVAQUIN ) 500 MG tablet    Sig: Take 1 tablet (500 mg total) by mouth daily for 3 days.    Dispense:  3 tablet    Refill:  0   levofloxacin   (LEVAQUIN ) 500 MG tablet    Sig: Take 1 tablet (500 mg total) by mouth daily for 7 days.    Dispense:  7 tablet    Refill:  0    Follow-up: No follow-ups on file.    Darlean Warmoth R Gerardine Peltz, FNP

## 2024-01-24 NOTE — Assessment & Plan Note (Addendum)
 Advised to follow-up with infectious disease specialist if no improvement Use of compression socks, elevation DASH diet for edema discussed Continue Tylenol  3 as needed for pain - levofloxacin  (LEVAQUIN ) 500 MG tablet; Take 1 tablet (500 mg total) by mouth daily for 7 days.  Dispense: 7 tablet; Refill: 0

## 2024-01-24 NOTE — Assessment & Plan Note (Signed)
 Has a prescription for Eliquis  but not taking medication

## 2024-01-24 NOTE — Assessment & Plan Note (Signed)
 Lab Results  Component Value Date   NA 141 12/29/2023   K 3.3 (L) 12/29/2023   CO2 26 12/29/2023   GLUCOSE 122 (H) 12/29/2023   BUN 22 12/29/2023   CREATININE 0.97 12/29/2023   CALCIUM 8.5 (L) 12/29/2023   EGFR 96 07/04/2023   GFRNONAA >60 12/29/2023  Received potassium supplement at emergency department Currently on hydrochlorothiazide  and furosemide , not on potassium supplement Rechecking labs

## 2024-01-24 NOTE — Assessment & Plan Note (Signed)
 Does not want to take NSAIDs Stated that only Tylenol  3 helps his pain Continue Tylenol  3 1 tablet every 6 hours as needed

## 2024-01-24 NOTE — Assessment & Plan Note (Addendum)
 On hydrochlorothiazide  and furosemide  Use of compression socks, elevation DASH diet for edema discussed

## 2024-01-24 NOTE — Patient Instructions (Signed)
 1. Cellulitis of left lower extremity (Primary)  - levofloxacin  (LEVAQUIN ) 500 MG tablet; Take 1 tablet (500 mg total) by mouth daily for 3 days.  Dispense: 3 tablet; Refill: 0  2. Hypokalemia  - Basic Metabolic Panel    For the swelling in your lower extremities, be sure to elevate your legs when able, mind the salt intake, stay physically active and consider wearing compression stockings.    It is important that you exercise regularly at least 30 minutes 5 times a week as tolerated  Think about what you will eat, plan ahead. Choose  clean, green, fresh or frozen over canned, processed or packaged foods which are more sugary, salty and fatty. 70 to 75% of food eaten should be vegetables and fruit. Three meals at set times with snacks allowed between meals, but they must be fruit or vegetables. Aim to eat over a 12 hour period , example 7 am to 7 pm, and STOP after  your last meal of the day. Drink water ,generally about 64 ounces per day, no other drink is as healthy. Fruit juice is best enjoyed in a healthy way, by EATING the fruit.  Thanks for choosing [facility] we consider it a privelige to serve you.

## 2024-01-25 ENCOUNTER — Ambulatory Visit: Payer: Self-pay | Admitting: Nurse Practitioner

## 2024-01-25 ENCOUNTER — Other Ambulatory Visit: Payer: Self-pay | Admitting: Nurse Practitioner

## 2024-01-25 DIAGNOSIS — R739 Hyperglycemia, unspecified: Secondary | ICD-10-CM

## 2024-01-25 DIAGNOSIS — R600 Localized edema: Secondary | ICD-10-CM

## 2024-01-25 LAB — BASIC METABOLIC PANEL WITH GFR
BUN/Creatinine Ratio: 15 (ref 10–24)
BUN: 14 mg/dL (ref 8–27)
CO2: 22 mmol/L (ref 20–29)
Calcium: 9.5 mg/dL (ref 8.6–10.2)
Chloride: 97 mmol/L (ref 96–106)
Creatinine, Ser: 0.93 mg/dL (ref 0.76–1.27)
Glucose: 212 mg/dL — ABNORMAL HIGH (ref 70–99)
Potassium: 3.9 mmol/L (ref 3.5–5.2)
Sodium: 138 mmol/L (ref 134–144)
eGFR: 92 mL/min/{1.73_m2} (ref >59)

## 2024-01-26 ENCOUNTER — Other Ambulatory Visit: Payer: Self-pay | Admitting: Nurse Practitioner

## 2024-01-26 NOTE — Telephone Encounter (Signed)
 Please advise [facility]

## 2024-02-01 ENCOUNTER — Emergency Department (HOSPITAL_COMMUNITY)

## 2024-02-01 ENCOUNTER — Other Ambulatory Visit: Payer: Self-pay

## 2024-02-01 ENCOUNTER — Encounter (HOSPITAL_COMMUNITY): Payer: Self-pay

## 2024-02-01 ENCOUNTER — Other Ambulatory Visit: Payer: Self-pay | Admitting: Nurse Practitioner

## 2024-02-01 ENCOUNTER — Emergency Department (HOSPITAL_COMMUNITY)
Admission: EM | Admit: 2024-02-01 | Discharge: 2024-02-01 | Disposition: A | Discharge status: Home / Self Care | Attending: Emergency Medicine | Admitting: Emergency Medicine

## 2024-02-01 DIAGNOSIS — I10 Essential (primary) hypertension: Secondary | ICD-10-CM | POA: Diagnosis not present

## 2024-02-01 DIAGNOSIS — Z79899 Other long term (current) drug therapy: Secondary | ICD-10-CM | POA: Diagnosis not present

## 2024-02-01 DIAGNOSIS — R0789 Other chest pain: Secondary | ICD-10-CM | POA: Diagnosis not present

## 2024-02-01 DIAGNOSIS — I2510 Atherosclerotic heart disease of native coronary artery without angina pectoris: Secondary | ICD-10-CM | POA: Diagnosis not present

## 2024-02-01 DIAGNOSIS — Z7901 Long term (current) use of anticoagulants: Secondary | ICD-10-CM | POA: Diagnosis not present

## 2024-02-01 DIAGNOSIS — R079 Chest pain, unspecified: Secondary | ICD-10-CM | POA: Diagnosis not present

## 2024-02-01 DIAGNOSIS — R600 Localized edema: Secondary | ICD-10-CM | POA: Insufficient documentation

## 2024-02-01 DIAGNOSIS — M25571 Pain in right ankle and joints of right foot: Secondary | ICD-10-CM | POA: Diagnosis not present

## 2024-02-01 DIAGNOSIS — K760 Fatty (change of) liver, not elsewhere classified: Secondary | ICD-10-CM | POA: Diagnosis not present

## 2024-02-01 DIAGNOSIS — M7989 Other specified soft tissue disorders: Secondary | ICD-10-CM | POA: Diagnosis not present

## 2024-02-01 LAB — CBC WITH DIFFERENTIAL/PLATELET
Abs Immature Granulocytes: 0.07 K/uL (ref 0.00–0.07)
Basophils Absolute: 0.1 K/uL (ref 0.0–0.1)
Basophils Relative: 1 %
Eosinophils Absolute: 0 K/uL (ref 0.0–0.5)
Eosinophils Relative: 0 %
HCT: 43.7 % (ref 39.0–52.0)
Hemoglobin: 14.7 g/dL (ref 13.0–17.0)
Immature Granulocytes: 1 %
Lymphocytes Relative: 15 %
Lymphs Abs: 1.9 K/uL (ref 0.7–4.0)
MCH: 30.3 pg (ref 26.0–34.0)
MCHC: 33.6 g/dL (ref 30.0–36.0)
MCV: 90.1 fL (ref 80.0–100.0)
Monocytes Absolute: 1 K/uL (ref 0.1–1.0)
Monocytes Relative: 8 %
Neutro Abs: 9.3 K/uL — ABNORMAL HIGH (ref 1.7–7.7)
Neutrophils Relative %: 75 %
Platelets: 235 K/uL (ref 150–400)
RBC: 4.85 MIL/uL (ref 4.22–5.81)
RDW: 13 % (ref 11.5–15.5)
WBC: 12.3 K/uL — ABNORMAL HIGH (ref 4.0–10.5)
nRBC: 0 % (ref 0.0–0.2)

## 2024-02-01 LAB — COMPREHENSIVE METABOLIC PANEL WITH GFR
ALT: 35 U/L (ref 0–44)
AST: 23 U/L (ref 15–41)
Albumin: 3.3 g/dL — ABNORMAL LOW (ref 3.5–5.0)
Alkaline Phosphatase: 40 U/L (ref 38–126)
Anion gap: 11 (ref 5–15)
BUN: 17 mg/dL (ref 8–23)
CO2: 28 mmol/L (ref 22–32)
Calcium: 8.7 mg/dL — ABNORMAL LOW (ref 8.9–10.3)
Chloride: 97 mmol/L — ABNORMAL LOW (ref 98–111)
Creatinine, Ser: 1.11 mg/dL (ref 0.61–1.24)
GFR, Estimated: >60 mL/min (ref >60)
Glucose, Bld: 161 mg/dL — ABNORMAL HIGH (ref 70–99)
Potassium: 4.4 mmol/L (ref 3.5–5.1)
Sodium: 136 mmol/L (ref 135–145)
Total Bilirubin: 0.6 mg/dL (ref 0.0–1.2)
Total Protein: 6.3 g/dL — ABNORMAL LOW (ref 6.5–8.1)

## 2024-02-01 LAB — TROPONIN I (HIGH SENSITIVITY)
Troponin I (High Sensitivity): 7 ng/L (ref <18)
Troponin I (High Sensitivity): 6 ng/L (ref <18)

## 2024-02-01 MED ORDER — IOHEXOL 350 MG/ML SOLN
100.0000 mL | Freq: Once | INTRAVENOUS | Status: AC | PRN
  Administered 2024-02-01: 100 mL via INTRAVENOUS

## 2024-02-01 MED ORDER — ONDANSETRON HCL 4 MG/2ML IJ SOLN
4.0000 mg | Freq: Once | INTRAMUSCULAR | Status: AC
  Administered 2024-02-01: 4 mg via INTRAVENOUS
  Filled 2024-02-01: qty 2

## 2024-02-01 NOTE — ED Triage Notes (Signed)
 Pt reports chest tightness that woke him from sleep tonight, 2 sharp pains to chest before calling EMS, one sharp pain in ambulance, and now says chest is just tight, sharp pains come and go. Pt had 325 mg ASA in route by EMS. Pt did have sob, denies this currently.

## 2024-02-01 NOTE — Discharge Instructions (Signed)
 Rest.  Take ibuprofen  600 mg every 6 hours as needed for pain.  Follow-up with primary doctor if symptoms or not improving in the next few days.

## 2024-02-01 NOTE — ED Notes (Signed)
 Pt c/o nausea- Dr Geroldine aware- new orders received.

## 2024-02-01 NOTE — ED Provider Notes (Signed)
 [place] EMERGENCY DEPARTMENT AT [facility] Provider Note   CSN: [id number] Arrival date & time: 02/01/24  9876     Patient presents with: Chest Pain   Clayton Pena is a 63 y.o. male.   Patient is a 63 year old male with past medical history of coronary artery disease, prior DVT, hypertension, recurrent cellulitis, and obesity.  Patient presenting today with complaints of chest pain.  He was awakened from sleep with a sharp pain in his right upper chest.  This lasted for several seconds, then resolved.  It has recurred 2 times since the initial episode, once with EMS.  He denies any shortness of breath or diaphoresis.  He denies any fevers, chills, or cough.  He is currently on antibiotics and being treated for cellulitis of his left lower leg.       Prior to Admission medications   Medication Sig Start Date End Date Taking? Authorizing Provider  acetaminophen -codeine  (TYLENOL  #3) 300-30 MG tablet Take 1 tablet by mouth every 6 (six) hours as needed for moderate pain (pain score 4-6). 01/29/24   Oley Bascom RAMAN, NP  albuterol  (VENTOLIN  HFA) 108 (90 Base) MCG/ACT inhaler Inhale 2 puffs into the lungs every 6 (six) hours as needed for wheezing or shortness of breath. 07/09/22   Oley Bascom RAMAN, NP  apixaban  (ELIQUIS ) 5 MG TABS tablet Take 1 tablet (5 mg total) by mouth 2 (two) times daily. Patient not taking: Reported on 12/20/2023 09/01/23   Oley Bascom RAMAN, NP  APPLE CIDER VINEGAR PO Take by mouth.    [provider]  Ascorbic Acid (VITAMIN C PO) Take by mouth.    [provider]  Bacillus Coagulans-Inulin (PROBIOTIC) 1-250 BILLION-MG CAPS Take 1 capsule by mouth daily. Patient not taking: Reported on 12/20/2023 12/15/23   Oley Bascom RAMAN, NP  cetirizine  (ZYRTEC ) 10 MG tablet Take 1 tablet (10 mg total) by mouth at bedtime. 02/12/22   Nichols, Tonya S, NP  cyclobenzaprine  (FLEXERIL ) 5 MG tablet Take 1 tablet (5 mg total) by mouth 3 (three) times daily as  needed for muscle spasms. Patient not taking: Reported on 11/22/2023 09/27/22   Idol, Julie, PA-C  Elastic Bandages & Supports (MEDICAL COMPRESSION STOCKINGS) MISC 1 each by Does not apply route daily. Patient not taking: Reported on 11/22/2023 12/07/16   Tilford Bertram HERO, FNP  Ferrous Sulfate  (IRON ) 325 (65 Fe) MG TABS Take 1 tablet (325 mg total) by mouth daily with breakfast. 09/01/23   Nichols, Tonya S, NP  furosemide  (LASIX ) 20 MG tablet Take 1 tablet (20 mg total) by mouth daily. 10/17/23   Paseda, Folashade R, FNP  hydrochlorothiazide  (HYDRODIURIL ) 25 MG tablet Take 1 tablet (25 mg total) by mouth daily. 09/01/23   Oley Bascom RAMAN, NP  ibuprofen  (ADVIL ) 200 MG tablet Take 600 mg by mouth every 6 (six) hours as needed for moderate pain (pain score 4-6). Patient not taking: Reported on 01/24/2024    [provider]  MAGNESIUM PO Take by mouth.    [provider]  zinc  gluconate 50 MG tablet Take 1 tablet (50 mg total) by mouth daily. 02/27/18   Vicenta Maduro, FNP    Allergies: Penicillins, Demerol  [meperidine hcl], and Demerol [meperidine]    Review of Systems  All other systems reviewed and are negative.   Updated Vital Signs BP (!) 145/73   Pulse 72   Temp 98.6 F (37 C)   Resp 12   Ht 5' 10 (1.778 m)   [place] ROLLEN)  159 kg   SpO2 96%   BMI 50.30 kg/m   Physical Exam Vitals and nursing note reviewed.  Constitutional:      General: He is not in acute distress.    Appearance: He is well-developed. He is not diaphoretic.  HENT:     Head: Normocephalic and atraumatic.  Cardiovascular:     Rate and Rhythm: Normal rate and regular rhythm.     Heart sounds: No murmur heard.    No friction rub.  Pulmonary:     Effort: Pulmonary effort is normal. No respiratory distress.     Breath sounds: Normal breath sounds. No wheezing or rales.  Abdominal:     General: Bowel sounds are normal. There is no distension.     Palpations: Abdomen is soft.     Tenderness: There is no  abdominal tenderness.  Musculoskeletal:        General: Normal range of motion.     Cervical back: Normal range of motion and neck supple.     Left lower leg: Edema present.     Comments: There is erythema and swelling noted in the left lower extremity.  There is no calf tenderness.  [place]a' sign is absent.  Skin:    General: Skin is warm and dry.  Neurological:     Mental Status: He is alert and oriented to person, place, and time.     Coordination: Coordination normal.     (all labs ordered are listed, but only abnormal results are displayed) Labs Reviewed  CBC WITH DIFFERENTIAL/PLATELET - Abnormal; Notable for the following components:      Result Value   WBC 12.3 (*)    Neutro Abs 9.3 (*)    All other components within normal limits  COMPREHENSIVE METABOLIC PANEL WITH GFR  TROPONIN I (HIGH SENSITIVITY)    EKG: EKG Interpretation Date/Time:  Wednesday February 01 2024 01:35:03 EDT Ventricular Rate:  75 PR Interval:  180 QRS Duration:  93 QT Interval:  389 QTC Calculation: 435 R Axis:   48  Text Interpretation: Sinus rhythm Normal ECG Confirmed by Geroldine Berg ([id number]) on 02/01/2024 1:41:25 AM  Radiology: [facility] Chest Port 1 View Result Date: 02/01/2024 CLINICAL DATA:  Chest pain EXAM: PORTABLE CHEST 1 VIEW COMPARISON:  03/23/2023 FINDINGS: Heart is borderline in size. No confluent airspace opacities, effusions or edema. No acute bony abnormality. IMPRESSION: No active disease. Electronically Signed   By: Franky Crease M.D.   On: 02/01/2024 02:17     Procedures   Medications Ordered in the ED  ondansetron  (ZOFRAN ) injection 4 mg (4 mg Intravenous Given 02/01/24 0225)                                    Medical Decision Making Amount and/or Complexity of Data Reviewed Labs: ordered. Radiology: ordered.  Risk Prescription drug management.   Patient is a 63 year old male presenting with intermittent sharp chest pains as described in the HPI.  Patient arrives here with  stable vital signs and is afebrile.  Physical examination basically unremarkable.  Laboratory studies obtained including CBC, basic metabolic panel, and troponin x 2, all of which are unremarkable.  Both troponins are negative.  Chest x-ray showing no acute process.  He does endorse pain in his right ankle and also notes continued cellulitis in his left leg.  For this reason, I did obtain a CTA of the chest to rule out pulmonary embolism.  This was negative.  X-ray of the right ankle also showing no acute process.  At this point, cause of patient's pain is likely musculoskeletal.  I highly doubt a cardiac etiology given the nature of his symptoms and negative workup.  I feels that he can safely be discharged.     Final diagnoses:  None    ED Discharge Orders     None          Geroldine Berg, MD 02/01/24 [phone]

## 2024-02-01 NOTE — ED Notes (Signed)
 This RN assisted pt to the bathroom. Pt did not want to use a urinal. Pt ambulatory but unstable d/t cellulitis in left leg. Pt then assisted to stretcher and taken to CT

## 2024-02-06 ENCOUNTER — Encounter: Payer: Self-pay | Admitting: Nurse Practitioner

## 2024-02-06 ENCOUNTER — Ambulatory Visit: Payer: Self-pay | Admitting: Nurse Practitioner

## 2024-02-06 VITALS — BP 151/82 | HR 88 | Ht 70.0 in | Wt 346.0 lb

## 2024-02-06 DIAGNOSIS — M25571 Pain in right ankle and joints of right foot: Secondary | ICD-10-CM

## 2024-02-06 MED ORDER — NITROGLYCERIN 0.4 MG SL SUBL: 0.4000 mg | SUBLINGUAL_TABLET | SUBLINGUAL | 3 refills | Status: AC | PRN

## 2024-02-06 MED ORDER — METHYLPREDNISOLONE SODIUM SUCC 125 MG IJ SOLR
125.0000 mg | Freq: Once | INTRAMUSCULAR | Status: AC
  Administered 2024-02-06: 125 mg via INTRAMUSCULAR

## 2024-02-06 NOTE — Progress Notes (Signed)
 Subjective   Patient ID: Clayton Pena, male    DOB: 07-03-1961, 63 y.o.   MRN: [id number]  Chief Complaint  Patient presents with   Follow-up    Feeling better; seen for CP   Skin Infection    Left leg; improving; still painful    Referring provider: Oley Bascom RAMAN, NP  Khyson Sebesta is a 63 y.o. male with Past Medical History: 12/20/2023: Abdominal distension 05/2019: Acute suppr otitis media w/o spon rupt ear drum, right ear No date: Angina at rest [facility]) No date: Cellulitis No date: Coronary artery disease No date: DVT (deep venous thrombosis) (HCC)     Comment:  right leg No date: Hypertension No date: MI (myocardial infarction) (HCC)     Comment:  2006, 2007, 2011   HPI  Patient presents today for follow-up visit.  He was recently seen in the ED for chest pain.  He does need a refill on nitroglycerin  tablets.  He has had a recent history of cellulitis of bilateral lower extremities.  This is much improved.  He does have significant right ankle pain today and thinks that this is a gout flare.  We will give Solu-Medrol  injection in office today.  Overall patient is doing well. Denies f/c/s, n/v/d, hemoptysis, PND, leg swelling Denies chest pain or edema     Allergies  Allergen Reactions   Penicillins Anaphylaxis    Has patient had a PCN reaction causing immediate rash, facial/tongue/throat swelling, SOB or lightheadedness with hypotension: unknown Has patient had a PCN reaction causing severe rash involving mucus membranes or skin necrosis: unknown Has patient had a PCN reaction that required hospitalization: unknown Has patient had a PCN reaction occurring within the last 10 years: no If all of the above answers are NO, then may proceed with Cephalosporin use.    Demerol  [Meperidine Hcl] Other (See Comments)    REACTION:  Increases blood pressure   Demerol [Meperidine] Other (See Comments)    REACTION:  Increases blood pressure    Immunization History   Administered Date(s) Administered   Influenza,inj,Quad PF,6+ Mos 06/15/2016, 05/25/2017, 05/16/2019   Tdap 02/22/2018    Tobacco History: Social History   Tobacco Use  Smoking Status Never  Smokeless Tobacco Never   Counseling given: Not Answered   Outpatient Encounter Medications as of 02/06/2024  Medication Sig   acetaminophen -codeine  (TYLENOL  #3) 300-30 MG tablet Take 1 tablet by mouth every 6 (six) hours as needed for moderate pain (pain score 4-6).   albuterol  (VENTOLIN  HFA) 108 (90 Base) MCG/ACT inhaler Inhale 2 puffs into the lungs every 6 (six) hours as needed for wheezing or shortness of breath.   APPLE CIDER VINEGAR PO Take by mouth.   Ascorbic Acid (VITAMIN C PO) Take by mouth.   cetirizine  (ZYRTEC ) 10 MG tablet Take 1 tablet (10 mg total) by mouth at bedtime.   Ferrous Sulfate  (IRON ) 325 (65 Fe) MG TABS Take 1 tablet by mouth once daily with breakfast   furosemide  (LASIX ) 20 MG tablet Take 1 tablet (20 mg total) by mouth daily.   hydrochlorothiazide  (HYDRODIURIL ) 25 MG tablet Take 1 tablet (25 mg total) by mouth daily.   MAGNESIUM PO Take by mouth.   metoprolol  succinate (TOPROL -XL) 25 MG 24 hr tablet Take 25 mg by mouth daily.   nitroGLYCERIN  (NITROSTAT ) 0.4 MG SL tablet Place 1 tablet (0.4 mg total) under the tongue every 5 (five) minutes as needed for chest pain.   zinc  gluconate 50 MG tablet Take 1 tablet (  50 mg total) by mouth daily.   apixaban  (ELIQUIS ) 5 MG TABS tablet Take 1 tablet (5 mg total) by mouth 2 (two) times daily. (Patient not taking: Reported on 02/06/2024)   Bacillus Coagulans-Inulin (PROBIOTIC) 1-250 BILLION-MG CAPS Take 1 capsule by mouth daily. (Patient not taking: Reported on 12/20/2023)   cyclobenzaprine  (FLEXERIL ) 5 MG tablet Take 1 tablet (5 mg total) by mouth 3 (three) times daily as needed for muscle spasms. (Patient not taking: Reported on 11/22/2023)   Elastic Bandages & Supports (MEDICAL COMPRESSION STOCKINGS) MISC 1 each by Does not apply  route daily. (Patient not taking: Reported on 11/22/2023)   ibuprofen  (ADVIL ) 200 MG tablet Take 600 mg by mouth every 6 (six) hours as needed for moderate pain (pain score 4-6). (Patient not taking: Reported on 02/06/2024)   [EXPIRED] methylPREDNISolone  sodium succinate (SOLU-MEDROL ) 125 mg/2 mL injection 125 mg    No facility-administered encounter medications on file as of 02/06/2024.    Review of Systems  Review of Systems  Constitutional: Negative.   HENT: Negative.    Cardiovascular: Negative.   Gastrointestinal: Negative.   Allergic/Immunologic: Negative.   Neurological: Negative.   Psychiatric/Behavioral: Negative.       Objective:   BP (!) 151/82 (BP Location: Left Arm, Patient Position: Sitting, Cuff Size: Large)   Pulse 88   Ht 5' 10 (1.778 m)   Wt (!) 346 lb (156.9 kg)   SpO2 97%   BMI 49.65 kg/m   Wt Readings from Last 5 Encounters:  02/06/24 (!) 346 lb (156.9 kg)  02/01/24 (!) 350 lb 8.5 oz (159 kg)  01/24/24 (!) 350 lb 6.4 oz (158.9 kg)  12/29/23 (!) 348 lb (157.9 kg)  12/20/23 (!) 348 lb (157.9 kg)     Physical Exam    Assessment & Plan:   Acute right ankle pain -     methylPREDNISolone  Sodium Succ  Other orders -     Nitroglycerin ; Place 1 tablet (0.4 mg total) under the tongue every 5 (five) minutes as needed for chest pain.  Dispense: 50 tablet; Refill: 3     Return in about 6 months (around 08/08/2024).   Bascom GORMAN Borer, NP 02/06/2024

## 2024-02-08 ENCOUNTER — Other Ambulatory Visit: Payer: Self-pay | Admitting: Nurse Practitioner

## 2024-02-08 DIAGNOSIS — I10 Essential (primary) hypertension: Secondary | ICD-10-CM

## 2024-02-23 ENCOUNTER — Other Ambulatory Visit: Payer: Self-pay | Admitting: Nurse Practitioner

## 2024-02-27 ENCOUNTER — Ambulatory Visit: Payer: Medicaid Other | Admitting: Physician Assistant

## 2024-02-27 ENCOUNTER — Inpatient Hospital Stay: Payer: Medicaid Other | Attending: Physician Assistant

## 2024-03-01 ENCOUNTER — Other Ambulatory Visit: Payer: Self-pay | Admitting: Vascular Surgery

## 2024-03-01 DIAGNOSIS — M7989 Other specified soft tissue disorders: Secondary | ICD-10-CM

## 2024-03-16 ENCOUNTER — Encounter: Payer: Self-pay | Admitting: Nurse Practitioner

## 2024-03-16 ENCOUNTER — Ambulatory Visit (INDEPENDENT_AMBULATORY_CARE_PROVIDER_SITE_OTHER): Payer: Self-pay | Admitting: Nurse Practitioner

## 2024-03-16 VITALS — BP 140/75 | HR 78 | Temp 98.1°F | Wt 350.4 lb

## 2024-03-16 DIAGNOSIS — L03119 Cellulitis of unspecified part of limb: Secondary | ICD-10-CM | POA: Diagnosis not present

## 2024-03-16 DIAGNOSIS — R600 Localized edema: Secondary | ICD-10-CM | POA: Diagnosis not present

## 2024-03-16 MED ORDER — FUROSEMIDE 20 MG PO TABS: 20.0000 mg | ORAL_TABLET | Freq: Every day | ORAL | 1 refills | Status: DC

## 2024-03-16 MED ORDER — METHYLPREDNISOLONE SODIUM SUCC 125 MG IJ SOLR
125.0000 mg | Freq: Once | INTRAMUSCULAR | Status: AC
Start: 2024-03-16 — End: 2024-03-16
  Administered 2024-03-16: 125 mg via INTRAMUSCULAR

## 2024-03-16 MED ORDER — CEFTRIAXONE SODIUM 1 G IJ SOLR
1.0000 g | Freq: Once | INTRAMUSCULAR | Status: AC
  Administered 2024-03-16: 1 g via INTRAMUSCULAR

## 2024-03-16 NOTE — Progress Notes (Signed)
 Subjective   Patient ID: Clayton Pena, male    DOB: 1960/09/30, 63 y.o.   MRN: [id number]  Chief Complaint  Patient presents with   Leg Swelling    Patient stated he came here for a shot    Referring provider: Oley Bascom RAMAN, NP  Clayton Pena is a 63 y.o. male with Past Medical History: 12/20/2023: Abdominal distension 05/2019: Acute suppr otitis media w/o spon rupt ear drum, right ear No date: Angina at rest [facility]) No date: Cellulitis No date: Coronary artery disease No date: DVT (deep venous thrombosis) (HCC)     Comment:  right leg No date: Hypertension No date: MI (myocardial infarction) (HCC)     Comment:  2006, 2007, 2011   HPI   Patient presents today for an acute visit.  He states he is having a flare of cellulitis to his bilateral lower extremities.  He states that over the last few days his legs have gotten and warm to the touch.  He does have bilateral edema which is chronic.  He does need a refill of Lasix  today.  He is requesting ceftriaxone  and Solu-Medrol  injections.  He states that this has worked well for him in the past. Denies f/c/s, n/v/d, hemoptysis, PND, leg swelling Denies chest pain or edema.      Allergies  Allergen Reactions   Penicillins Anaphylaxis    Has patient had a PCN reaction causing immediate rash, facial/tongue/throat swelling, SOB or lightheadedness with hypotension: unknown Has patient had a PCN reaction causing severe rash involving mucus membranes or skin necrosis: unknown Has patient had a PCN reaction that required hospitalization: unknown Has patient had a PCN reaction occurring within the last 10 years: no If all of the above answers are NO, then may proceed with Cephalosporin use.    Demerol  [Meperidine Hcl] Other (See Comments)    REACTION:  Increases blood pressure   Demerol [Meperidine] Other (See Comments)    REACTION:  Increases blood pressure    Immunization History  Administered Date(s) Administered    Influenza,inj,Quad PF,6+ Mos 06/15/2016, 05/25/2017, 05/16/2019   Tdap 02/22/2018    Tobacco History: Social History   Tobacco Use  Smoking Status Never  Smokeless Tobacco Never   Counseling given: Not Answered   Outpatient Encounter Medications as of 03/16/2024  Medication Sig   acetaminophen -codeine  (TYLENOL  #3) 300-30 MG tablet Take 1 tablet by mouth every 6 (six) hours as needed for moderate pain (pain score 4-6).   albuterol  (VENTOLIN  HFA) 108 (90 Base) MCG/ACT inhaler Inhale 2 puffs into the lungs every 6 (six) hours as needed for wheezing or shortness of breath.   APPLE CIDER VINEGAR PO Take by mouth.   Ascorbic Acid (VITAMIN C PO) Take by mouth.   cetirizine  (ZYRTEC ) 10 MG tablet Take 1 tablet (10 mg total) by mouth at bedtime.   Ferrous Sulfate  (IRON ) 325 (65 Fe) MG TABS Take 1 tablet by mouth once daily with breakfast   hydrochlorothiazide  (HYDRODIURIL ) 25 MG tablet Take 1 tablet by mouth once daily   MAGNESIUM PO Take by mouth.   metoprolol  succinate (TOPROL -XL) 25 MG 24 hr tablet Take 25 mg by mouth daily.   nitroGLYCERIN  (NITROSTAT ) 0.4 MG SL tablet Place 1 tablet (0.4 mg total) under the tongue every 5 (five) minutes as needed for chest pain.   zinc  gluconate 50 MG tablet Take 1 tablet (50 mg total) by mouth daily.   [DISCONTINUED] furosemide  (LASIX ) 20 MG tablet Take 1 tablet (20 mg total) by  mouth daily.   apixaban  (ELIQUIS ) 5 MG TABS tablet Take 1 tablet (5 mg total) by mouth 2 (two) times daily. (Patient not taking: Reported on 02/06/2024)   Bacillus Coagulans-Inulin (PROBIOTIC) 1-250 BILLION-MG CAPS Take 1 capsule by mouth daily. (Patient not taking: Reported on 12/20/2023)   cyclobenzaprine  (FLEXERIL ) 5 MG tablet Take 1 tablet (5 mg total) by mouth 3 (three) times daily as needed for muscle spasms. (Patient not taking: Reported on 11/22/2023)   Elastic Bandages & Supports (MEDICAL COMPRESSION STOCKINGS) MISC 1 each by Does not apply route daily. (Patient not taking:  Reported on 11/22/2023)   furosemide  (LASIX ) 20 MG tablet Take 1 tablet (20 mg total) by mouth daily.   ibuprofen  (ADVIL ) 200 MG tablet Take 600 mg by mouth every 6 (six) hours as needed for moderate pain (pain score 4-6). (Patient not taking: Reported on 02/06/2024)   [EXPIRED] cefTRIAXone  (ROCEPHIN ) injection 1 g    [EXPIRED] methylPREDNISolone  sodium succinate (SOLU-MEDROL ) 125 mg/2 mL injection 125 mg    No facility-administered encounter medications on file as of 03/16/2024.    Review of Systems  Review of Systems  Constitutional: Negative.   HENT: Negative.    Cardiovascular:  Positive for leg swelling.  Gastrointestinal: Negative.   Allergic/Immunologic: Negative.   Neurological: Negative.   Psychiatric/Behavioral: Negative.       Objective:   BP (!) 140/75   Pulse 78   Temp 98.1 F (36.7 C) (Oral)   Wt (!) 350 lb 6.4 oz (158.9 kg)   SpO2 97%   BMI 50.28 kg/m   Wt Readings from Last 5 Encounters:  03/16/24 (!) 350 lb 6.4 oz (158.9 kg)  02/06/24 (!) 346 lb (156.9 kg)  02/01/24 (!) 350 lb 8.5 oz (159 kg)  01/24/24 (!) 350 lb 6.4 oz (158.9 kg)  12/29/23 (!) 348 lb (157.9 kg)     Physical Exam Vitals and nursing note reviewed.  Constitutional:      General: He is not in acute distress.    Appearance: He is well-developed.  Cardiovascular:     Rate and Rhythm: Normal rate and regular rhythm.  Pulmonary:     Effort: Pulmonary effort is normal.     Breath sounds: Normal breath sounds.  Skin:    General: Skin is warm and dry.  Neurological:     Mental Status: He is alert and oriented to person, place, and time.       Assessment & Plan:   Cellulitis of lower extremity, unspecified laterality -     cefTRIAXone  Sodium -     methylPREDNISolone  Sodium Succ  Localized edema -     Furosemide ; Take 1 tablet (20 mg total) by mouth daily.  Dispense: 60 tablet; Refill: 1     Return if symptoms worsen or fail to improve.   Bascom GORMAN Borer, NP 03/16/2024

## 2024-04-09 ENCOUNTER — Other Ambulatory Visit: Payer: Self-pay | Admitting: Nurse Practitioner

## 2024-04-09 DIAGNOSIS — I10 Essential (primary) hypertension: Secondary | ICD-10-CM

## 2024-04-10 ENCOUNTER — Ambulatory Visit (HOSPITAL_COMMUNITY)

## 2024-04-10 ENCOUNTER — Ambulatory Visit (HOSPITAL_COMMUNITY): Attending: Vascular Surgery | Admitting: Vascular Surgery

## 2024-04-10 NOTE — Progress Notes (Deleted)
 Patient name: Clayton Pena MRN: [id number] DOB: September 14, 1960 Sex: male  REASON FOR CONSULT: LE edema  HPI: Clayton Pena is a 63 y.o. male, ***  Past Medical History:  Diagnosis Date   Abdominal distension 12/20/2023   Acute suppr otitis media w/o spon rupt ear drum, right ear 05/2019   Angina at rest [facility])    Cellulitis    Coronary artery disease    DVT (deep venous thrombosis) (HCC)    right leg   Hypertension    MI (myocardial infarction) (HCC)    2006, 2007, 2011    Past Surgical History:  Procedure Laterality Date   BACK SURGERY     CORONARY ANGIOPLASTY     HERNIA REPAIR      Family History  Problem Relation Age of Onset   Hypertension Mother    CAD Mother    Diabetes Mother    Hypertension Father    CAD Father    Prostate cancer Father    Diabetes Brother    Stroke Neg Hx     SOCIAL HISTORY: Social History   Socioeconomic History   Marital status: Divorced    Spouse name: Not on file   Number of children: Not on file   Years of education: Not on file   Highest education level: Not on file  Occupational History   Not on file  Tobacco Use   Smoking status: Never   Smokeless tobacco: Never  Vaping Use   Vaping status: Never Used  Substance and Sexual Activity   Alcohol use: Yes    Comment: occasional   Drug use: No   Sexual activity: Yes    Partners: Female  Other Topics Concern   Not on file  Social History Narrative   Not on file   Social Drivers of Health   Financial Resource Strain: Not on file  Food Insecurity: No Food Insecurity (07/19/2023)   Hunger Vital Sign    Worried About Running Out of Food in the Last Year: Never true    Ran Out of Food in the Last Year: Never true  Transportation Needs: No Transportation Needs (07/19/2023)   PRAPARE - Administrator, Civil Service (Medical): No    Lack of Transportation (Non-Medical): No  Physical Activity: Not on file  Stress: Not on file  Social Connections: Not on file   Intimate Partner Violence: Not At Risk (07/19/2023)   Humiliation, Afraid, Rape, and Kick questionnaire    Fear of Current or Ex-Partner: No    Emotionally Abused: No    Physically Abused: No    Sexually Abused: No    Allergies  Allergen Reactions   Penicillins Anaphylaxis    Has patient had a PCN reaction causing immediate rash, facial/tongue/throat swelling, SOB or lightheadedness with hypotension: unknown Has patient had a PCN reaction causing severe rash involving mucus membranes or skin necrosis: unknown Has patient had a PCN reaction that required hospitalization: unknown Has patient had a PCN reaction occurring within the last 10 years: no If all of the above answers are NO, then may proceed with Cephalosporin use.    Demerol  [Meperidine Hcl] Other (See Comments)    REACTION:  Increases blood pressure   Demerol [Meperidine] Other (See Comments)    REACTION:  Increases blood pressure    Current Outpatient Medications  Medication Sig Dispense Refill   acetaminophen -codeine  (TYLENOL  #3) 300-30 MG tablet Take 1 tablet by mouth every 6 (six) hours as needed for moderate pain (pain  score 4-6). 10 tablet 0   albuterol  (VENTOLIN  HFA) 108 (90 Base) MCG/ACT inhaler Inhale 2 puffs into the lungs every 6 (six) hours as needed for wheezing or shortness of breath. 18 g 3   apixaban  (ELIQUIS ) 5 MG TABS tablet Take 1 tablet (5 mg total) by mouth 2 (two) times daily. (Patient not taking: Reported on 02/06/2024) 180 tablet 0   APPLE CIDER VINEGAR PO Take by mouth.     Ascorbic Acid (VITAMIN C PO) Take by mouth.     Bacillus Coagulans-Inulin (PROBIOTIC) 1-250 BILLION-MG CAPS Take 1 capsule by mouth daily. (Patient not taking: Reported on 12/20/2023) 30 capsule 2   cetirizine  (ZYRTEC ) 10 MG tablet Take 1 tablet (10 mg total) by mouth at bedtime. 30 tablet 11   cyclobenzaprine  (FLEXERIL ) 5 MG tablet Take 1 tablet (5 mg total) by mouth 3 (three) times daily as needed for muscle spasms. (Patient  not taking: Reported on 11/22/2023) 15 tablet 0   Elastic Bandages & Supports (MEDICAL COMPRESSION STOCKINGS) MISC 1 each by Does not apply route daily. (Patient not taking: Reported on 11/22/2023) 1 each 2   Ferrous Sulfate  (IRON ) 325 (65 Fe) MG TABS Take 1 tablet by mouth once daily with breakfast 90 tablet 0   furosemide  (LASIX ) 20 MG tablet Take 1 tablet (20 mg total) by mouth daily. 60 tablet 1   hydrochlorothiazide  (HYDRODIURIL ) 25 MG tablet Take 1 tablet by mouth once daily 60 tablet 0   ibuprofen  (ADVIL ) 200 MG tablet Take 600 mg by mouth every 6 (six) hours as needed for moderate pain (pain score 4-6). (Patient not taking: Reported on 02/06/2024)     MAGNESIUM PO Take by mouth.     metoprolol  succinate (TOPROL -XL) 25 MG 24 hr tablet Take 25 mg by mouth daily.     nitroGLYCERIN  (NITROSTAT ) 0.4 MG SL tablet Place 1 tablet (0.4 mg total) under the tongue every 5 (five) minutes as needed for chest pain. 50 tablet 3   zinc  gluconate 50 MG tablet Take 1 tablet (50 mg total) by mouth daily. 30 tablet 3   No current facility-administered medications for this visit.    REVIEW OF SYSTEMS:  [X]  denotes positive finding, [ ]  denotes negative finding Cardiac  Comments:  Chest pain or chest pressure: ***   Shortness of breath upon exertion:    Short of breath when lying flat:    Irregular heart rhythm:        Vascular    Pain in calf, thigh, or hip brought on by ambulation:    Pain in feet at night that wakes you up from your sleep:     Blood clot in your veins:    Leg swelling:         Pulmonary    Oxygen at home:    Productive cough:     Wheezing:         Neurologic    Sudden weakness in arms or legs:     Sudden numbness in arms or legs:     Sudden onset of difficulty speaking or slurred speech:    Temporary loss of vision in one eye:     Problems with dizziness:         Gastrointestinal    Blood in stool:     Vomited blood:         Genitourinary    Burning when urinating:      Blood in urine:        Psychiatric  Major depression:         Hematologic    Bleeding problems:    Problems with blood clotting too easily:        Skin    Rashes or ulcers:        Constitutional    Fever or chills:      PHYSICAL EXAM: There were no vitals filed for this visit.  GENERAL: The patient is a well-nourished male, in no acute distress. The vital signs are documented above. CARDIAC: There is a regular rate and rhythm.  VASCULAR: *** PULMONARY: There is good air exchange bilaterally without wheezing or rales. ABDOMEN: Soft and non-tender with normal pitched bowel sounds.  MUSCULOSKELETAL: There are no major deformities or cyanosis. NEUROLOGIC: No focal weakness or paresthesias are detected. SKIN: There are no ulcers or rashes noted. PSYCHIATRIC: The patient has a normal affect.  DATA:   ***  Assessment/Plan:  ***   Lonni DOROTHA Gaskins, MD [facility] Office: [phone]

## 2024-04-17 ENCOUNTER — Telehealth: Payer: Self-pay

## 2024-04-17 DIAGNOSIS — I10 Essential (primary) hypertension: Secondary | ICD-10-CM

## 2024-04-17 NOTE — Progress Notes (Signed)
 Complex Care Management Note Care Guide Note  04/17/2024 Name: Clayton Pena MRN: [id number] DOB: 1960/09/13   Complex Care Management Outreach Attempts: An unsuccessful telephone outreach was attempted today to offer the patient information about available complex care management services.  Follow Up Plan:  Additional outreach attempts will be made to offer the patient complex care management information and services.   Encounter Outcome:  No Answer  Dreama [facility]  [facility], [facility] VBCI Assistant Direct Dial: [phone]  Fax: [phone]

## 2024-04-23 NOTE — Progress Notes (Signed)
 Complex Care Management Note  Care Guide Note 04/23/2024 Name: Clayton Pena MRN: [id number] DOB: 04/05/1961  Clayton Pena is a 62 y.o. year old male who sees Oley Bascom RAMAN, NP for primary care. I reached out to Oval Lucchesi by phone today to offer complex care management services.  [name] was given information about Complex Care Management services today including:   The Complex Care Management services include support from the care team which includes your Nurse Care Manager, Clinical Social Worker, or Pharmacist.  The Complex Care Management team is here to help remove barriers to the health concerns and goals most important to you. Complex Care Management services are voluntary, and the patient may decline or stop services at any time by request to their care team member.   Complex Care Management Consent Status: Patient agreed to services and verbal consent obtained.   Follow up plan:  Telephone appointment with complex care management team member scheduled for:  05/03/24 at 3:30 p.m.   Encounter Outcome:  Patient Scheduled  Dreama [facility]  [facility], [facility] VBCI Assistant Direct Dial: [phone]  Fax: [phone]

## 2024-05-01 ENCOUNTER — Telehealth: Admitting: Physician Assistant

## 2024-05-01 ENCOUNTER — Ambulatory Visit: Payer: Self-pay

## 2024-05-01 DIAGNOSIS — K047 Periapical abscess without sinus: Secondary | ICD-10-CM | POA: Diagnosis not present

## 2024-05-01 MED ORDER — CLINDAMYCIN HCL 300 MG PO CAPS: 300.0000 mg | ORAL_CAPSULE | Freq: Three times a day (TID) | ORAL | 0 refills | Status: DC

## 2024-05-01 MED ORDER — CHLORHEXIDINE GLUCONATE 0.12 % MT SOLN: 15.0000 mL | Freq: Two times a day (BID) | OROMUCOSAL | 0 refills | Status: AC

## 2024-05-01 NOTE — Patient Instructions (Addendum)
 Clayton Pena, thank you for joining Clayton Velma Lunger, PA-C for today's virtual visit.  While this provider is not your primary care provider (PCP), if your PCP is located in our provider database this encounter information will be shared with them immediately following your visit.   A [facility] MyChart account gives you access to today's visit and all your visits, tests, and labs performed at [facility]  click here if you don't have a [facility] MyChart account or go to [email or web address]  Consent: (Patient) Clayton Pena provided verbal consent for this virtual visit at the beginning of the encounter.  Current Medications:  Current Outpatient Medications:    chlorhexidine (PERIDEX) 0.12 % solution, Use as directed 15 mLs in the mouth or throat 2 (two) times daily., Disp: 120 mL, Rfl: 0   clindamycin  (CLEOCIN ) 300 MG capsule, Take 1 capsule (300 mg total) by mouth 3 (three) times daily., Disp: 21 capsule, Rfl: 0   acetaminophen -codeine  (TYLENOL  #3) 300-30 MG tablet, Take 1 tablet by mouth every 6 (six) hours as needed for moderate pain (pain score 4-6)., Disp: 10 tablet, Rfl: 0   albuterol  (VENTOLIN  HFA) 108 (90 Base) MCG/ACT inhaler, Inhale 2 puffs into the lungs every 6 (six) hours as needed for wheezing or shortness of breath., Disp: 18 g, Rfl: 3   apixaban  (ELIQUIS ) 5 MG TABS tablet, Take 1 tablet (5 mg total) by mouth 2 (two) times daily. (Patient not taking: Reported on 02/06/2024), Disp: 180 tablet, Rfl: 0   APPLE CIDER VINEGAR PO, Take by mouth., Disp: , Rfl:    Ascorbic Acid (VITAMIN C PO), Take by mouth., Disp: , Rfl:    cetirizine  (ZYRTEC ) 10 MG tablet, Take 1 tablet (10 mg total) by mouth at bedtime., Disp: 30 tablet, Rfl: 11   Ferrous Sulfate  (IRON ) 325 (65 Fe) MG TABS, Take 1 tablet by mouth once daily with breakfast, Disp: 90 tablet, Rfl: 0   furosemide  (LASIX ) 20 MG tablet, Take 1 tablet (20 mg total) by mouth daily., Disp: 60 tablet, Rfl: 1    hydrochlorothiazide  (HYDRODIURIL ) 25 MG tablet, Take 1 tablet by mouth once daily, Disp: 60 tablet, Rfl: 0   MAGNESIUM PO, Take by mouth., Disp: , Rfl:    nitroGLYCERIN  (NITROSTAT ) 0.4 MG SL tablet, Place 1 tablet (0.4 mg total) under the tongue every 5 (five) minutes as needed for chest pain., Disp: 50 tablet, Rfl: 3   zinc  gluconate 50 MG tablet, Take 1 tablet (50 mg total) by mouth daily., Disp: 30 tablet, Rfl: 3   Medications ordered in this encounter:  Meds ordered this encounter  Medications   clindamycin  (CLEOCIN ) 300 MG capsule    Sig: Take 1 capsule (300 mg total) by mouth 3 (three) times daily.    Dispense:  21 capsule    Refill:  0    Supervising Provider:   BLAISE ALEENE KIDD [[id number]]   chlorhexidine (PERIDEX) 0.12 % solution    Sig: Use as directed 15 mLs in the mouth or throat 2 (two) times daily.    Dispense:  120 mL    Refill:  0    Supervising Provider:   BLAISE ALEENE KIDD [[id number]]     *If you need refills on other medications prior to your next appointment, please contact your pharmacy*  Follow-Up: Call back or seek an in-person evaluation if the symptoms worsen or if the condition fails to improve as anticipated.  [facility] [phone]  Other Instructions  [facility]  [facility] [phone]   [facility] [phone]   [facility] [phone]   [facility] [phone]   [facility]gent [facility] Care Now [phone]   [facility] [phone]   [facility] [phone]   [facility] [place] [phone]   Medicaid Children under 21 [facility] (2 locations: [place] and [place])   [place]: [phone]   [place]: [phone]       [place]       [facility] [phone]   [facility] [phone]   [facility] [phone] (Resource; not on site)   [employer]  [phone]       [facility] [phone]       [facility]       [facility] [phone]       [facility]       Emergency Dentist 24/7 [facility] [phone]   [facility] at [facility] [phone]   [facility] [place] [phone]       [facility]       Dental Care at [facility] [phone]   [facility] [phone]   [facility] [phone]   [facility] [phone]       Dental Abscess  A dental abscess is an area of pus in or around a tooth. It comes from an infection. It can cause pain and other symptoms. Treatment will help with symptoms and prevent the infection from spreading. What are the causes? This condition is caused by an infection in or around the tooth. This can be from: Very bad tooth decay (cavities). A bad injury to the tooth, such as a broken or chipped tooth. What increases the risk? The risk to get an abscess is higher in males. It is also more likely in people who: Have dental decay. Have very bad gum disease. Eat sugary snacks between meals. Use tobacco. Have diabetes. Have a weak disease-fighting system (immune system). Do not brush their teeth regularly. What are the signs or symptoms? Some mild symptoms are: Tenderness. Bad breath. Fever. A sharp, sour taste in the mouth. Pain in and around the infected tooth. Worse symptoms of this condition include: Swollen neck glands. Chills. Pus draining around the tooth. Swelling and redness around the tooth, the mouth, or the face. Very bad pain in and around the tooth. The worst symptoms can include: Difficulty swallowing. Difficulty opening your mouth. Feeling like you may vomit or vomiting. How is this treated? This is treated by getting rid of the infection. Your dentist will discuss ways to do this, including: Antibiotic medicines. Antibacterial mouth rinse. An incision  in the abscess to drain out the pus. A root canal. Removing the tooth. Follow these instructions at home: Medicines Take over-the-counter and prescription medicines only as told by your dentist. If you were prescribed an antibiotic medicine, take it as told by your dentist. Do not stop taking it even if you start to feel better. If you were prescribed a gel that has numbing medicine in it, use it exactly as told. Ask your dentist if you should avoid driving or using machines while you are taking your medicine. General instructions Rinse your mouth often with salt water . To make salt water , dissolve -1 tsp (3-6 g) of salt in 1 cup (237 mL) of warm water . Eat a soft diet while your mouth is healing. Drink enough fluid to keep your pee (urine) pale  yellow. Do not apply heat to the outside of your mouth. Do not smoke or use any products that contain nicotine or tobacco. If you need help quitting, ask your dentist. Keep all follow-up visits. Prevent an abscess Brush your teeth every morning and every night. Use fluoride toothpaste. Floss your teeth each day. Get dental cleanings as often as told by your dentist. Think about getting dental sealant put on teeth that have deep holes (decay). Drink water  that has fluoride in it. Most tap water  has fluoride. Check the label on bottled water  to see if it has fluoride in it. Drink water  instead of sugary drinks. Eat healthy meals and snacks. Wear a mouth guard or face shield when you play sports. Contact a doctor if: Your pain is worse and medicine does not help. Get help right away if: You have a fever or chills. Your symptoms suddenly get worse. You have a very bad headache. You have problems breathing or swallowing. You have trouble opening your mouth. You have swelling in your neck or close to your eye. These symptoms may be an emergency. Get help right away. Call your local emergency services (911 in the [place] Do not wait to see if  the symptoms will go away. Do not drive yourself to the hospital. Summary A dental abscess is an area of pus in or around a tooth. It is caused by an infection. Treatment will help with symptoms and prevent the infection from spreading. Take over-the-counter and prescription medicines only as told by your dentist. To prevent an abscess, take good care of your teeth. Brush your teeth every morning and night. Use floss every day. Get dental cleanings as often as told by your dentist. This information is not intended to replace advice given to you by your health care provider. Make sure you discuss any questions you have with your health care provider. Document Revised: 09/17/2020 Document Reviewed: 09/18/2020 Elsevier Patient Education  2024 Elsevier Inc.   If you have been instructed to have an in-person evaluation today at a local Urgent Care facility, please use the link below. It will take you to a list of all of our available [facility], including address, phone number and hours of operation. Please do not delay care.  [facility]  If you or a family member do not have a primary care provider, use the link below to schedule a visit and establish care. When you choose a [facility] primary care physician or advanced practice provider, you gain a long-term partner in health. Find a Primary Care Provider  Learn more about [facility]'s in-office and virtual care options:  - Get Care Now

## 2024-05-01 NOTE — Progress Notes (Signed)
 Virtual Visit Consent   Clayton Pena, you are scheduled for a virtual visit with a [facility] provider today. Just as with appointments in the office, your consent must be obtained to participate. Your consent will be active for this visit and any virtual visit you may have with one of our providers in the next 365 days. If you have a MyChart account, a copy of this consent can be sent to you electronically.  As this is a virtual visit, video technology does not allow for your provider to perform a traditional examination. This may limit your provider's ability to fully assess your condition. If your provider identifies any concerns that need to be evaluated in person or the need to arrange testing (such as labs, EKG, etc.), we will make arrangements to do so. Although advances in technology are sophisticated, we cannot ensure that it will always work on either your end or our end. If the connection with a video visit is poor, the visit may have to be switched to a telephone visit. With either a video or telephone visit, we are not always able to ensure that we have a secure connection.  By engaging in this virtual visit, you consent to the provision of healthcare and authorize for your insurance to be billed (if applicable) for the services provided during this visit. Depending on your insurance coverage, you may receive a charge related to this service.  I need to obtain your verbal consent now. Are you willing to proceed with your visit today? [name] has provided verbal consent on 05/01/2024 for a virtual visit (video or telephone). Clayton Pena, [place]  Date: 05/01/2024 6:45 PM   Virtual Visit via Video Note   I, Clayton Pena, connected with  Winfield Caba  ([id number], [birth year]) on 05/01/24 at  6:45 PM EDT by a video-enabled telemedicine application and verified that I am speaking with the correct person using two identifiers.  Location: Patient: Virtual Visit Location  Patient: Home Provider: Virtual Visit Location Provider: Home Office   I discussed the limitations of evaluation and management by telemedicine and the availability of in person appointments. The patient expressed understanding and agreed to proceed.    History of Present Illness: Clayton Pena is a 63 y.o. who identifies as a male who was assigned male at birth, and is being seen today for concern of possible dental abscess. Notes history of recurring issues with this particular tooth -- L upper premolar. Denies known trauma or injury to the affected tooth.Notes swelling and pain over past few days. Denies fever or chills. Applied BC powders to the base of the gum with relief in pain. Is currently without a dental provider.  HPI: HPI  Problems:  Patient Active Problem List   Diagnosis Date Noted   Hypokalemia 01/24/2024   Right ankle pain 01/24/2024   Abdominal distension 12/20/2023   Bilateral lower extremity edema 11/22/2023   Encounter for examination following treatment at hospital 11/22/2023   Subcutaneous cyst 11/22/2023   Leukocytosis 07/18/2023   Prediabetes 04/13/2022   Hypogonadism, male 04/13/2022   Healthcare maintenance 11/13/2021   Transaminitis 11/08/2019   Long term current use of anticoagulant 08/15/2019   Impaired functional mobility, balance, gait, and endurance 08/15/2019   Acute gout of right foot 06/25/2019   Class 3 severe obesity due to excess calories with serious comorbidity and body mass index (BMI) of 40.0 to 44.9 in adult [name]) 06/25/2019   Acute suppurative otitis media of right ear with spontaneous  rupture of tympanic membrane 05/28/2019   Right thigh pain 04/17/2019   History of DVT (deep vein thrombosis) 08/22/2017   Anticoagulated 08/22/2017   Essential hypertension, benign 05/25/2017   Morbid obesity with BMI of 40.0-44.9, adult (HCC) 05/25/2017   Blood pressure check 02/08/2017   Morbid obesity (HCC) 01/12/2017   Acute venous embolism and  thrombosis of deep vessels of proximal end of left lower extremity (HCC) 07/09/2016   CAD (coronary artery disease) 06/14/2016   Cellulitis 06/14/2016   Phlebitis 06/13/2016    Allergies:  Allergies  Allergen Reactions   Penicillins Anaphylaxis    Has patient had a PCN reaction causing immediate rash, facial/tongue/throat swelling, SOB or lightheadedness with hypotension: unknown Has patient had a PCN reaction causing severe rash involving mucus membranes or skin necrosis: unknown Has patient had a PCN reaction that required hospitalization: unknown Has patient had a PCN reaction occurring within the last 10 years: no If all of the above answers are NO, then may proceed with Cephalosporin use.    Demerol  [Meperidine Hcl] Other (See Comments)    REACTION:  Increases blood pressure   Demerol [Meperidine] Other (See Comments)    REACTION:  Increases blood pressure   Medications:  Current Outpatient Medications:    chlorhexidine (PERIDEX) 0.12 % solution, Use as directed 15 mLs in the mouth or throat 2 (two) times daily., Disp: 120 mL, Rfl: 0   clindamycin  (CLEOCIN ) 300 MG capsule, Take 1 capsule (300 mg total) by mouth 3 (three) times daily., Disp: 21 capsule, Rfl: 0   acetaminophen -codeine  (TYLENOL  #3) 300-30 MG tablet, Take 1 tablet by mouth every 6 (six) hours as needed for moderate pain (pain score 4-6)., Disp: 10 tablet, Rfl: 0   albuterol  (VENTOLIN  HFA) 108 (90 Base) MCG/ACT inhaler, Inhale 2 puffs into the lungs every 6 (six) hours as needed for wheezing or shortness of breath., Disp: 18 g, Rfl: 3   apixaban  (ELIQUIS ) 5 MG TABS tablet, Take 1 tablet (5 mg total) by mouth 2 (two) times daily. (Patient not taking: Reported on 02/06/2024), Disp: 180 tablet, Rfl: 0   APPLE CIDER VINEGAR PO, Take by mouth., Disp: , Rfl:    Ascorbic Acid (VITAMIN C PO), Take by mouth., Disp: , Rfl:    cetirizine  (ZYRTEC ) 10 MG tablet, Take 1 tablet (10 mg total) by mouth at bedtime., Disp: 30 tablet,  Rfl: 11   Ferrous Sulfate  (IRON ) 325 (65 Fe) MG TABS, Take 1 tablet by mouth once daily with breakfast, Disp: 90 tablet, Rfl: 0   furosemide  (LASIX ) 20 MG tablet, Take 1 tablet (20 mg total) by mouth daily., Disp: 60 tablet, Rfl: 1   hydrochlorothiazide  (HYDRODIURIL ) 25 MG tablet, Take 1 tablet by mouth once daily, Disp: 60 tablet, Rfl: 0   MAGNESIUM PO, Take by mouth., Disp: , Rfl:    nitroGLYCERIN  (NITROSTAT ) 0.4 MG SL tablet, Place 1 tablet (0.4 mg total) under the tongue every 5 (five) minutes as needed for chest pain., Disp: 50 tablet, Rfl: 3   zinc  gluconate 50 MG tablet, Take 1 tablet (50 mg total) by mouth daily., Disp: 30 tablet, Rfl: 3  Observations/Objective: Patient is well-developed, well-nourished in no acute distress.  Resting comfortably at home.  Head is normocephalic, atraumatic.  No labored breathing.  Speech is clear and coherent with logical content.  Patient is alert and oriented at baseline.   Assessment and Plan: 1. Dental infection (Primary) - clindamycin  (CLEOCIN ) 300 MG capsule; Take 1 capsule (300 mg total) by mouth  3 (three) times daily.  Dispense: 21 capsule; Refill: 0 - chlorhexidine (PERIDEX) 0.12 % solution; Use as directed 15 mLs in the mouth or throat 2 (two) times daily.  Dispense: 120 mL; Refill: 0  Supportive measures and OTC medications reviewed. STart Clindamycin  TID x 7 days. Peridex per orders. Dental resources given.   Follow Up Instructions: I discussed the assessment and treatment plan with the patient. The patient was provided an opportunity to ask questions and all were answered. The patient agreed with the plan and demonstrated an understanding of the instructions.  A copy of instructions were sent to the patient via MyChart unless otherwise noted below.   The patient was advised to call back or seek an in-person evaluation if the symptoms worsen or if the condition fails to improve as anticipated.    Clayton Velma Lunger, PA-C

## 2024-05-01 NOTE — Telephone Encounter (Signed)
  FYI Only or Action Required?: FYI only for provider.  Patient was last seen in primary care on 03/16/2024 by Oley Bascom RAMAN, NP.  Called Nurse Triage reporting Dental Problem.  Symptoms began several days ago.  Interventions attempted: OTC medications: bc powder.  Symptoms are: unchanged.  Triage Disposition: Call Dentist When Office is Open  Patient/caregiver understands and will follow disposition?: Yes Pt calling to request Clindamycin  150 mg that he was prescribed before because he feels like he has a dental infection, problem with his mouth. CRM sent to clinical staff to see if they would be willing to send in meds for him w/o appt. He went to ER for this same problem 02/2023. [phone] [facility] Phone)  Reason for Disposition  Toothache present > 24 hours  Answer Assessment - Initial Assessment Questions 1. LOCATION: Which tooth is hurting?  (e.g., right-side/left-side, upper/lower, front/back)     Left lower rear molar 2. ONSET: When did the toothache start?  (e.g., hours, days)      Four days ago 3. SEVERITY: How bad is the toothache?  (Scale 1-10; mild, moderate or severe)     3/10 4. SWELLING: Is there any visible swelling of your face?     denies 5. OTHER SYMPTOMS: Do you have any other symptoms? (e.g., fever)     denies 6. PREGNANCY: Is there any chance you are pregnant? When was your last menstrual period?     N/a  Protocols used: Toothache-A-AH

## 2024-05-02 ENCOUNTER — Other Ambulatory Visit: Payer: Self-pay | Admitting: Nurse Practitioner

## 2024-05-02 ENCOUNTER — Telehealth: Payer: Self-pay

## 2024-05-02 MED ORDER — CLINDAMYCIN HCL 150 MG PO CAPS: 150.0000 mg | ORAL_CAPSULE | Freq: Three times a day (TID) | ORAL | 0 refills | Status: AC

## 2024-05-02 NOTE — Telephone Encounter (Signed)
 Copied from CRM [id number]. Topic: Clinical - Medication Question >> May 01, 2024  5:40 PM Lauren C wrote: Reason for CRM: Pt is requesting clindenycin 150 mg for an infection in the mouth he might have. He says it was first prescribed by an ER dentist and he said if he ever needed more, to reach out to his PCP. Presented to ER for dental infection 03/23/23. Does pt need an appt before med can be prescribed?

## 2024-05-02 NOTE — Telephone Encounter (Signed)
 Sent to provider. Earlier. [facility]

## 2024-05-03 ENCOUNTER — Other Ambulatory Visit: Payer: Self-pay | Admitting: *Deleted

## 2024-05-03 NOTE — Patient Instructions (Signed)
 Visit Information   Visit scheduled for  by telephone on 10/30 at 3 pm.  Please call the care guide team at [phone] if you need to cancel, schedule, or reschedule an appointment.   Please call the Suicide and Crisis Lifeline: 988 call the [place]  National Suicide Prevention Lifeline: 4406976254 or TTY: 325-520-6617 TTY 779-366-9708) to talk to a trained counselor call 1-800-273-TALK (toll free, 24 hour hotline) go to [facility] [address], [place] [phone]) call 911 if you are experiencing a Mental Health or Behavioral Health Crisis or need someone to talk to.  Ashvin Adelson, RN, BSN, Theatre manager [employer] [phone]

## 2024-05-18 ENCOUNTER — Encounter: Payer: Self-pay | Admitting: Nurse Practitioner

## 2024-05-18 ENCOUNTER — Other Ambulatory Visit: Payer: Self-pay | Admitting: Nurse Practitioner

## 2024-05-18 ENCOUNTER — Ambulatory Visit (INDEPENDENT_AMBULATORY_CARE_PROVIDER_SITE_OTHER): Admitting: Nurse Practitioner

## 2024-05-18 ENCOUNTER — Ambulatory Visit: Payer: Self-pay

## 2024-05-18 DIAGNOSIS — K047 Periapical abscess without sinus: Secondary | ICD-10-CM | POA: Diagnosis not present

## 2024-05-18 DIAGNOSIS — I10 Essential (primary) hypertension: Secondary | ICD-10-CM

## 2024-05-18 MED ORDER — HYDROCHLOROTHIAZIDE 25 MG PO TABS: 25.0000 mg | ORAL_TABLET | Freq: Every day | ORAL | 2 refills | Status: DC

## 2024-05-18 MED ORDER — CLINDAMYCIN HCL 300 MG PO CAPS: 300.0000 mg | ORAL_CAPSULE | Freq: Three times a day (TID) | ORAL | 0 refills | Status: AC

## 2024-05-18 NOTE — Telephone Encounter (Signed)
 Patient requested a 60 or 90 day supply of hydrochlorothiazide .

## 2024-05-18 NOTE — Telephone Encounter (Signed)
 Copied from CRM [id number]. Topic: Clinical - Medication Refill >> May 18, 2024 11:17 AM Rosaria E wrote: Medication: clindamycin  (CLEOCIN ) 300 MG capsule  hydrochlorothiazide  (HYDRODIURIL ) 25 MG tablet (wants 60 day supply)  Has the patient contacted their pharmacy? Yes (Agent: If no, request that the patient contact the pharmacy for the refill. If patient does not wish to contact the pharmacy document the reason why and proceed with request.) (Agent: If yes, when and what did the pharmacy advise?)  This is the patient's preferred pharmacy:  [facility] [address] [place] - [address] [place] [address] Phone: [phone] Fax: [phone]  Is this the correct pharmacy for this prescription? Yes If no, delete pharmacy and type the correct one.   Has the prescription been filled recently? Yes  Is the patient out of the medication? Yes  Has the patient been seen for an appointment in the last year OR does the patient have an upcoming appointment? Yes  Can we respond through MyChart? Yes  Agent: Please be advised that Rx refills may take up to 3 business days. We ask that you follow-up with your pharmacy.

## 2024-05-18 NOTE — Telephone Encounter (Signed)
 FYI Only or Action Required?: FYI only for provider.  Patient was last seen in primary care on 05/01/2024 by Gladis Elsie BROCKS, PA-C.  Called Nurse Triage reporting Dental Pain.  Symptoms began 2-3 weeks ago.  Interventions attempted: Prescription medications: clindamycin .  Symptoms are: gradually improving.  Triage Disposition: See Physician Within 24 Hours (overriding Call Dentist When Office is Open)  Patient/caregiver understands and will follow disposition?: Yes                              Copied from CRM [id number]. Topic: Clinical - Medication Refill >> May 18, 2024 11:17 AM Rosaria E wrote: Medication: clindamycin  (CLEOCIN ) 300 MG capsule   hydrochlorothiazide  (HYDRODIURIL ) 25 MG tablet (wants 60 day supply)   Has the patient contacted their pharmacy? Yes (Agent: If no, request that the patient contact the pharmacy for the refill. If patient does not wish to contact the pharmacy document the reason why and proceed with request.) (Agent: If yes, when and what did the pharmacy advise?)   This is the patient's preferred pharmacy:  [facility] [address], [place] - [address] [place] [address] Phone: [phone] Fax: [phone]   Is this the correct pharmacy for this prescription? Yes If no, delete pharmacy and type the correct one.    Has the prescription been filled recently? Yes   Is the patient out of the medication? Yes   Has the patient been seen for an appointment in the last year OR does the patient have an upcoming appointment? Yes   Can we respond through MyChart? Yes   Agent: Please be advised that Rx refills may take up to 3 business days. We ask that you follow-up with your pharmacy.  Reason for Disposition  Toothache present > 24 hours  Answer Assessment - Initial Assessment Questions 1. LOCATION: Which tooth is hurting?  (e.g., right-side/left-side, upper/lower, front/back)      Rear molar on left side 2. ONSET: When did the toothache start?  (e.g., hours, days)      2-3 weeks now 3. SEVERITY: How bad is the toothache?  (Scale 1-10; mild, moderate or severe)     States pain has improved, but he can still feel it sometimes depending on what he is doing, rates pain a 6 when it happens 4. SWELLING: Is there any visible swelling of your face?     Denies 5. OTHER SYMPTOMS: Do you have any other symptoms? (e.g., fever)     Denies fever, denies discharge, denies bleeding 6. PREGNANCY: Is there any chance you are pregnant? When was your last menstrual period?     N/A   Patient was seen for a virtual UC appointment on 05/01/24. Patient stated pain has improved, but has not completely gone away. Patient stated he has already called his dentist and first appointment was 6-7 weeks out. This RN advised in-person evaluation, as initial visit was virtual. Patient verbalized understanding. Patient scheduled for this afternoon with PCP.  Protocols used: Toothache-A-AH

## 2024-05-18 NOTE — Progress Notes (Signed)
 Subjective   Patient ID: Clayton Pena, male    DOB: Mar 03, 1961, 63 y.o.   MRN: [id number]  Chief Complaint  Patient presents with   Dental Pain    Left back bottom molar, no pain today     Referring provider: Oley Bascom RAMAN, NP  [place]ola Depaz is a 63 y.o. male with Past Medical History: 12/20/2023: Abdominal distension 05/2019: Acute suppr otitis media w/o spon rupt ear drum, right ear No date: Angina at rest No date: Cellulitis No date: Coronary artery disease No date: DVT (deep venous thrombosis) (HCC)     Comment:  right leg No date: Hypertension No date: MI (myocardial infarction) (HCC)     Comment:  2006, 2007, 2011   HPI  Patient presents today for an acute visit.  He has been having issues with dental pain to his left back lower teeth.  He will follow-up with dentist but will not be able to get appointment for 5 weeks.  He has completed a course of clindamycin  for the past 7 days.  Symptoms are improving.  We will extend this course for 10-day course. Denies f/c/s, n/v/d, hemoptysis, PND, leg swelling Denies chest pain or edema     Allergies  Allergen Reactions   Penicillins Anaphylaxis    Has patient had a PCN reaction causing immediate rash, facial/tongue/throat swelling, SOB or lightheadedness with hypotension: unknown Has patient had a PCN reaction causing severe rash involving mucus membranes or skin necrosis: unknown Has patient had a PCN reaction that required hospitalization: unknown Has patient had a PCN reaction occurring within the last 10 years: no If all of the above answers are NO, then may proceed with Cephalosporin use.    Demerol  [Meperidine Hcl] Other (See Comments)    REACTION:  Increases blood pressure   Demerol [Meperidine] Other (See Comments)    REACTION:  Increases blood pressure    Immunization History  Administered Date(s) Administered   Influenza,inj,Quad PF,6+ Mos 06/15/2016, 05/25/2017, 05/16/2019   Tdap 02/22/2018     Tobacco History: Social History   Tobacco Use  Smoking Status Never  Smokeless Tobacco Never   Counseling given: Not Answered   Outpatient Encounter Medications as of 05/18/2024  Medication Sig   acetaminophen -codeine  (TYLENOL  #3) 300-30 MG tablet Take 1 tablet by mouth every 6 (six) hours as needed for moderate pain (pain score 4-6).   albuterol  (VENTOLIN  HFA) 108 (90 Base) MCG/ACT inhaler Inhale 2 puffs into the lungs every 6 (six) hours as needed for wheezing or shortness of breath.   APPLE CIDER VINEGAR PO Take by mouth.   Ascorbic Acid (VITAMIN C PO) Take by mouth.   cetirizine  (ZYRTEC ) 10 MG tablet Take 1 tablet (10 mg total) by mouth at bedtime.   chlorhexidine (PERIDEX) 0.12 % solution Use as directed 15 mLs in the mouth or throat 2 (two) times daily.   Ferrous Sulfate  (IRON ) 325 (65 Fe) MG TABS Take 1 tablet by mouth once daily with breakfast   furosemide  (LASIX ) 20 MG tablet Take 1 tablet (20 mg total) by mouth daily.   MAGNESIUM PO Take by mouth.   nitroGLYCERIN  (NITROSTAT ) 0.4 MG SL tablet Place 1 tablet (0.4 mg total) under the tongue every 5 (five) minutes as needed for chest pain.   zinc  gluconate 50 MG tablet Take 1 tablet (50 mg total) by mouth daily.   [DISCONTINUED] clindamycin  (CLEOCIN ) 300 MG capsule Take 1 capsule (300 mg total) by mouth 3 (three) times daily.   [DISCONTINUED] hydrochlorothiazide  (HYDRODIURIL )  25 MG tablet Take 1 tablet by mouth once daily   apixaban  (ELIQUIS ) 5 MG TABS tablet Take 1 tablet (5 mg total) by mouth 2 (two) times daily. (Patient not taking: Reported on 05/18/2024)   clindamycin  (CLEOCIN ) 300 MG capsule Take 1 capsule (300 mg total) by mouth 3 (three) times daily for 3 days.   hydrochlorothiazide  (HYDRODIURIL ) 25 MG tablet Take 1 tablet (25 mg total) by mouth daily.   No facility-administered encounter medications on file as of 05/18/2024.    Review of Systems  Review of Systems  Constitutional: Negative.   HENT:  Positive  for dental problem.   Cardiovascular: Negative.   Gastrointestinal: Negative.   Allergic/Immunologic: Negative.   Neurological: Negative.   Psychiatric/Behavioral: Negative.       Objective:   BP (!) 163/83 (BP Location: Left Arm, Patient Position: Sitting, Cuff Size: Large)   Pulse 73   Wt (!) 354 lb 3.2 oz (160.7 kg)   SpO2 93%   BMI 50.82 kg/m   Wt Readings from Last 5 Encounters:  05/18/24 (!) 354 lb 3.2 oz (160.7 kg)  03/16/24 (!) 350 lb 6.4 oz (158.9 kg)  02/06/24 (!) 346 lb (156.9 kg)  02/01/24 (!) 350 lb 8.5 oz (159 kg)  01/24/24 (!) 350 lb 6.4 oz (158.9 kg)     Physical Exam Vitals and nursing note reviewed.  Constitutional:      General: He is not in acute distress.    Appearance: He is well-developed.  HENT:     Mouth/Throat:     Dentition: Dental tenderness and dental caries present.   Cardiovascular:     Rate and Rhythm: Normal rate and regular rhythm.  Pulmonary:     Effort: Pulmonary effort is normal.     Breath sounds: Normal breath sounds.  Skin:    General: Skin is warm and dry.  Neurological:     Mental Status: He is alert and oriented to person, place, and time.       Assessment & Plan:   Essential hypertension -     hydroCHLOROthiazide ; Take 1 tablet (25 mg total) by mouth daily.  Dispense: 90 tablet; Refill: 2  Dental infection -     Clindamycin  HCl; Take 1 capsule (300 mg total) by mouth 3 (three) times daily for 3 days.  Dispense: 9 capsule; Refill: 0     Return if symptoms worsen or fail to improve.     Bascom GORMAN Borer, NP 05/18/2024

## 2024-05-21 MED ORDER — HYDROCHLOROTHIAZIDE 25 MG PO TABS: 25.0000 mg | ORAL_TABLET | Freq: Every day | ORAL | 1 refills | Status: DC

## 2024-05-22 NOTE — Telephone Encounter (Signed)
 Pt seen. Clayton Pena

## 2024-05-24 ENCOUNTER — Other Ambulatory Visit: Payer: Self-pay

## 2024-05-24 ENCOUNTER — Other Ambulatory Visit: Payer: Self-pay | Admitting: *Deleted

## 2024-05-24 DIAGNOSIS — I10 Essential (primary) hypertension: Secondary | ICD-10-CM

## 2024-05-24 NOTE — Patient Outreach (Signed)
 Complex Care Management   Visit Note  05/24/2024  Name:  Clayton Pena MRN: [id number] DOB: [birth year]  Situation: Referral received for Complex Care Management related to HTN I obtained verbal consent from Patient.  Visit completed with Patient  on the phone  Background:   Past Medical History:  Diagnosis Date   Abdominal distension 12/20/2023   Acute suppr otitis media w/o spon rupt ear drum, right ear 05/2019   Angina at rest    Cellulitis    Coronary artery disease    DVT (deep venous thrombosis) (HCC)    right leg   Hypertension    MI (myocardial infarction) (HCC)    2006, 2007, 2011    Assessment: Patient Reported Symptoms:  Cognitive Cognitive Status: Able to follow simple commands, Alert and oriented to person, place, and time, Insightful and able to interpret abstract concepts, Normal speech and language skills Cognitive/Intellectual Conditions Management [RPT]: None reported or documented in medical history or problem list   Health Maintenance Behaviors: Annual physical exam, Stress management, Social activities, Sleep adequate, Healthy diet Healing Pattern: Average Health Facilitated by: Healthy diet, Prayer/meditation, Rest, Stress management  Neurological Neurological Review of Symptoms: No symptoms reported Neurological Management Strategies: Adequate rest, Routine screening Neurological Self-Management Outcome: 4 (good)  HEENT HEENT Symptoms Reported: No symptoms reported HEENT Management Strategies: Adequate rest, Routine screening HEENT Self-Management Outcome: 4 (good)    Cardiovascular Cardiovascular Symptoms Reported: Swelling in legs or feet Does patient have uncontrolled Hypertension?: No Cardiovascular Management Strategies: Adequate rest, Routine screening Cardiovascular Comment: Patient reports swelling in his feet after he works.  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  He reports that he will elevate his feet at night when he sleeps  and the swelling is gone the next day.  Respiratory   Respiratory Management Strategies: Adequate rest, Routine screening Respiratory Self-Management Outcome: 4 (good)  Endocrine Endocrine Symptoms Reported: No symptoms reported Is patient diabetic?: No Endocrine Self-Management Outcome: 4 (good)  Gastrointestinal Gastrointestinal Symptoms Reported: No symptoms reported Gastrointestinal Management Strategies: Adequate rest Gastrointestinal Self-Management Outcome: 4 (good)    Genitourinary Genitourinary Symptoms Reported: No symptoms reported Genitourinary Management Strategies: Adequate rest Genitourinary Self-Management Outcome: 4 (good)  Integumentary Integumentary Symptoms Reported: No symptoms reported Skin Management Strategies: Adequate rest, Routine screening  Musculoskeletal Musculoskelatal Symptoms Reviewed: Joint pain Additional Musculoskeletal Details: Patient reports joint pain in his knees bilaterally.  He reports that he uses Tylenol  # 3 for pain as needed. Musculoskeletal Management Strategies: Adequate rest, Routine screening Musculoskeletal Self-Management Outcome: 3 (uncertain) Falls in the past year?:  (Patient reports that he fell two weeks ago. He reports that he was trying to put his shoe on and he was on an elevated surface and he fell. He reports abrasion to his knee.) Number of falls in past year: 1 or less Was there an injury with Fall?: Yes (Abrasion on knees reports fall two week ago.) Fall Risk Category Calculator: 2 Patient Fall Risk Level: Moderate Fall Risk Patient at Risk for Falls Due to: Other (Comment) (Fall trying to put his shoe on.) Fall risk Follow up: Falls evaluation completed, Education provided, Falls prevention discussed  Psychosocial Psychosocial Symptoms Reported: Sadness - if selected complete PHQ 2-9 Additional Psychological Details: Reports going through a recent divorce.  He reports that he has good days and bad days.  He declines  support from LCSW at this time. Behavioral Management Strategies: Coping strategies, Adequate rest Behavioral Health Self-Management Outcome: 3 (uncertain) Major Change/Loss/Stressor/Fears (CP): Separation or  divorce Behaviors When Feeling Stressed/Fearful: Patient reports that he will read and he will also play praise music to help him cope with the recent divorce Techniques to Cope with Loss/Stress/Change: Diversional activities Do you feel physically threatened by others?: No    05/24/2024    PHQ2-9 Depression Screening   Little interest or pleasure in doing things Not at all  Feeling down, depressed, or hopeless Several days  PHQ-2 - Total Score 1  Trouble falling or staying asleep, or sleeping too much    Feeling tired or having little energy    Poor appetite or overeating     Feeling bad about yourself - or that you are a failure or have let yourself or your family down    Trouble concentrating on things, such as reading the newspaper or watching television    Moving or speaking so slowly that other people could have noticed.  Or the opposite - being so fidgety or restless that you have been moving around a lot more than usual    Thoughts that you would be better off dead, or hurting yourself in some way    PHQ2-9 Total Score    If you checked off any problems, how difficult have these problems made it for you to do your work, take care of things at home, or get along with other people    Depression Interventions/Treatment      There were no vitals filed for this visit.  Medications Reviewed Today     Reviewed by Jorja Nichole LABOR, RN (Case Manager) on 05/24/24 at 1423  Med List Status: <None>   Medication Order Taking? Sig Documenting Provider Last Dose Status Informant  acetaminophen -codeine  (TYLENOL  #3) 300-30 MG tablet [id number] Yes Take 1 tablet by mouth every 6 (six) hours as needed for moderate pain (pain score 4-6). Nichols, Tonya S, NP  Active   albuterol  (VENTOLIN   HFA) 108 (90 Base) MCG/ACT inhaler [id number] Yes Inhale 2 puffs into the lungs every 6 (six) hours as needed for wheezing or shortness of breath. Oley Bascom RAMAN, NP  Active            Med Note SAMULE IHA   Wed May 04, 2023 11:38 AM) prn  apixaban  (ELIQUIS ) 5 MG TABS tablet [id number]  Take 1 tablet (5 mg total) by mouth 2 (two) times daily.  Patient not taking: Reported on 05/24/2024   Oley Bascom RAMAN, NP  Active   APPLE CIDER VINEGAR PO [id number] Yes Take by mouth. [provider]  Active   Ascorbic Acid (VITAMIN C PO) [id number] Yes Take by mouth. [provider]  Active   cetirizine  (ZYRTEC ) 10 MG tablet [id number]  Take 1 tablet (10 mg total) by mouth at bedtime.  Patient not taking: Reported on 05/24/2024   Oley Bascom RAMAN, NP  Active   chlorhexidine (PERIDEX) 0.12 % solution [id number] Yes Use as directed 15 mLs in the mouth or throat 2 (two) times daily. Gladis Elsie BROCKS, PA-C  Active   Ferrous Sulfate  (IRON ) 325 (65 Fe) MG TABS [id number] Yes Take 1 tablet by mouth once daily with breakfast Nichols, Tonya S, NP  Active   furosemide  (LASIX ) 20 MG tablet [id number] Yes Take 1 tablet (20 mg total) by mouth daily. Oley Bascom RAMAN, NP  Active   hydrochlorothiazide  (HYDRODIURIL ) 25 MG tablet [id number] Yes Take 1 tablet (25 mg total) by mouth daily. Oley Bascom RAMAN, NP  Active   hydrochlorothiazide  (HYDRODIURIL ) 25 MG tablet [id number]  Take 1 tablet (  25 mg total) by mouth daily. Oley Bascom RAMAN, NP  Active   MAGNESIUM PO [id number] Yes Take by mouth. [provider]  Active   nitroGLYCERIN  (NITROSTAT ) 0.4 MG SL tablet [id number] Yes Place 1 tablet (0.4 mg total) under the tongue every 5 (five) minutes as needed for chest pain. Oley Bascom RAMAN, NP  Active   zinc  gluconate 50 MG tablet [id number] Yes Take 1 tablet (50 mg total) by mouth daily. Vicenta Maduro, FNP  Active Self            Recommendation:   PCP Follow-up Specialty provider follow-up  ;Oncology-08/28/24 Continue Current Plan of Care  Follow Up Plan:   Telephone follow-up : 2 weeks; 06/13/24 @ 3:30 pm  Ofelia Podolski, RN, BSN, ACM RN Care Manager [employer] [phone]

## 2024-05-24 NOTE — Patient Instructions (Signed)
 Visit Information  Thank you for taking time to visit with me today. Please don't hesitate to contact me if I can be of assistance to you before our next scheduled appointment.  Your next care management appointment is by telephone on 06/13/24  at 3:30 pm  Please call the care guide team at [phone] if you need to cancel, schedule, or reschedule an appointment.   Please call the Suicide and Crisis Lifeline: 988 call the [place]  National Suicide Prevention Lifeline: (309)190-9264 or TTY: 902-298-6733 TTY (816)521-8746) to talk to a trained counselor call 1-800-273-TALK (toll free, 24 hour hotline) go to [facility] [address], [place] [phone]) call the [facility] Crisis Line: [phone] call 911 if you are experiencing a Mental Health or Behavioral Health Crisis or need someone to talk to.  Adisa Litt, RN, BSN, Theatre Manager [employer] [phone]

## 2024-05-25 ENCOUNTER — Other Ambulatory Visit: Payer: Self-pay | Admitting: Nurse Practitioner

## 2024-05-28 ENCOUNTER — Other Ambulatory Visit: Payer: Self-pay

## 2024-05-28 NOTE — Patient Instructions (Signed)
 Visit Information  Thank you for taking time to visit with me today. Please don't hesitate to contact me if I can be of assistance to you before our next scheduled appointment.  Our next appointment is by telephone on 06/07/2024 at 2:30pm Please call the care guide team at [phone] if you need to cancel or reschedule your appointment.   Following is a copy of your care plan:   Goals Addressed             This Visit's Progress    BSW Goal       Current SDOH Barriers:  Needs help with accessing grab bar in his new home/bathroom  Interventions: Referred patient to community resources  BSW will follow up with patient once he has his new address to be able to set up with community housing solutions           Please call the Suicide and Crisis Lifeline: 988 call the [place]  National Suicide Prevention Lifeline: 514 657 1244 or TTY: 2282500929 TTY (770)397-0812) to talk to a trained counselor call 1-800-273-TALK (toll free, 24 hour hotline) go to [facility] [address] [place] [phone]) call 911 if you are experiencing a Mental Health or Behavioral Health Crisis or need someone to talk to.  Patient verbalized understanding of Care plan and visit instructions communicated this visit  Orlean Fey, BSW [facility]  [facility] Social Worker, [employer] [facility] [phone]

## 2024-05-28 NOTE — Patient Outreach (Signed)
 Social Drivers of Health  Community Resource and Care Coordination Visit Note   05/28/2024  Name: Clayton Pena MRN: [id number] DOB:09-03-1960  Situation: Referral received for [facility] needs assessment and assistance related to Shower/grab bar. I obtained verbal consent from Patient.  Visit completed with Patient on the phone.   Background:      Assessment:   Goals Addressed             This Visit's Progress    BSW Goal       Current SDOH Barriers:  Needs help with accessing grab bar in his new home/bathroom  Interventions: Referred patient to community resources  BSW will follow up with patient once he has his new address to be able to set up with community housing solutions           Recommendation:   attend all scheduled provider appointments  Follow Up Plan:   Telephone follow-up on 11/13 at 2:30 pm  Orlean Fey, BSW [place]  [facility] Social Worker, [employer] [facility] [phone]

## 2024-06-07 ENCOUNTER — Other Ambulatory Visit: Payer: Self-pay

## 2024-06-07 NOTE — Patient Instructions (Signed)

## 2024-06-12 ENCOUNTER — Other Ambulatory Visit: Payer: Self-pay

## 2024-06-12 DIAGNOSIS — Z7409 Other reduced mobility: Secondary | ICD-10-CM

## 2024-06-12 DIAGNOSIS — R600 Localized edema: Secondary | ICD-10-CM

## 2024-06-12 DIAGNOSIS — Z86718 Personal history of other venous thrombosis and embolism: Secondary | ICD-10-CM

## 2024-06-12 NOTE — Progress Notes (Signed)
 Done [facility]

## 2024-06-13 ENCOUNTER — Telehealth: Payer: Self-pay | Admitting: *Deleted

## 2024-06-28 ENCOUNTER — Ambulatory Visit: Payer: Self-pay | Admitting: Nurse Practitioner

## 2024-06-28 ENCOUNTER — Encounter: Payer: Self-pay | Admitting: Nurse Practitioner

## 2024-06-28 VITALS — BP 143/99 | HR 96 | Wt 343.0 lb

## 2024-06-28 DIAGNOSIS — L03115 Cellulitis of right lower limb: Secondary | ICD-10-CM

## 2024-06-28 DIAGNOSIS — I10 Essential (primary) hypertension: Secondary | ICD-10-CM

## 2024-06-28 DIAGNOSIS — Z86718 Personal history of other venous thrombosis and embolism: Secondary | ICD-10-CM

## 2024-06-28 DIAGNOSIS — J309 Allergic rhinitis, unspecified: Secondary | ICD-10-CM

## 2024-06-28 DIAGNOSIS — R600 Localized edema: Secondary | ICD-10-CM

## 2024-06-28 DIAGNOSIS — Z13220 Encounter for screening for lipoid disorders: Secondary | ICD-10-CM | POA: Diagnosis not present

## 2024-06-28 DIAGNOSIS — Z7901 Long term (current) use of anticoagulants: Secondary | ICD-10-CM | POA: Diagnosis not present

## 2024-06-28 MED ORDER — METHYLPREDNISOLONE SODIUM SUCC 125 MG IJ SOLR
125.0000 mg | Freq: Once | INTRAMUSCULAR | Status: AC
  Administered 2024-06-28: 125 mg via INTRAMUSCULAR

## 2024-06-28 MED ORDER — HYDROCHLOROTHIAZIDE 25 MG PO TABS: 25.0000 mg | ORAL_TABLET | Freq: Every day | ORAL | 1 refills | Status: AC

## 2024-06-28 MED ORDER — FUROSEMIDE 20 MG PO TABS: 20.0000 mg | ORAL_TABLET | Freq: Every day | ORAL | 1 refills | Status: AC

## 2024-06-28 MED ORDER — CEFTRIAXONE SODIUM 1 G IJ SOLR
1.0000 g | Freq: Once | INTRAMUSCULAR | Status: AC
  Administered 2024-06-28: 1 g via INTRAMUSCULAR

## 2024-06-28 MED ORDER — FERROUS SULFATE 325 (65 FE) MG PO TABS: 325.0000 mg | ORAL_TABLET | Freq: Every day | ORAL | 0 refills | Status: AC

## 2024-06-28 MED ORDER — CETIRIZINE HCL 10 MG PO TABS: 10.0000 mg | ORAL_TABLET | Freq: Every day | ORAL | 11 refills | Status: AC

## 2024-06-28 NOTE — Progress Notes (Signed)
 Subjective   Patient ID: Clayton Pena, male    DOB: 28-Mar-1961, 63 y.o.   MRN: [id number]  Chief Complaint  Patient presents with   Pain Management    Lower extremities right    Referring provider: Oley Bascom RAMAN, NP  Clayton Pena is a 63 y.o. male with Past Medical History: 12/20/2023: Abdominal distension 05/2019: Acute suppr otitis media w/o spon rupt ear drum, right ear No date: Angina at rest No date: Cellulitis No date: Coronary artery disease No date: DVT (deep venous thrombosis) (HCC)     Comment:  right leg No date: Hypertension No date: MI (myocardial infarction) (HCC)     Comment:  2006, 2007, 2011   HPI  Patient presents today for a follow-up visit.  She is having right ankle pain that has flared back up.  Is right lower extremity is red and warm to the touch.  Patient does have frequent flares of cellulitis as well.  We will order injections in office today.  Patient does need a recheck on labs today.  He does need refills on medications today. Denies f/c/s, n/v/d, hemoptysis, PND, leg swelling Denies chest pain or edema           Allergies  Allergen Reactions   Penicillins Anaphylaxis    Has patient had a PCN reaction causing immediate rash, facial/tongue/throat swelling, SOB or lightheadedness with hypotension: unknown Has patient had a PCN reaction causing severe rash involving mucus membranes or skin necrosis: unknown Has patient had a PCN reaction that required hospitalization: unknown Has patient had a PCN reaction occurring within the last 10 years: no If all of the above answers are NO, then may proceed with Cephalosporin use.    Demerol  [Meperidine Hcl] Other (See Comments)    REACTION:  Increases blood pressure   Demerol [Meperidine] Other (See Comments)    REACTION:  Increases blood pressure    Immunization History  Administered Date(s) Administered   Influenza,inj,Quad PF,6+ Mos 06/15/2016, 05/25/2017, 05/16/2019   Tdap  02/22/2018    Tobacco History: Social History   Tobacco Use  Smoking Status Never  Smokeless Tobacco Never   Counseling given: Not Answered   Outpatient Encounter Medications as of 06/28/2024  Medication Sig   acetaminophen -codeine  (TYLENOL  #3) 300-30 MG tablet Take 1 tablet by mouth every 6 (six) hours as needed for moderate pain (pain score 4-6).   albuterol  (VENTOLIN  HFA) 108 (90 Base) MCG/ACT inhaler Inhale 2 puffs into the lungs every 6 (six) hours as needed for wheezing or shortness of breath.   apixaban  (ELIQUIS ) 5 MG TABS tablet Take 1 tablet (5 mg total) by mouth 2 (two) times daily.   APPLE CIDER VINEGAR PO Take by mouth.   Ascorbic Acid (VITAMIN C PO) Take by mouth.   chlorhexidine  (PERIDEX ) 0.12 % solution Use as directed 15 mLs in the mouth or throat 2 (two) times daily.   MAGNESIUM PO Take by mouth.   nitroGLYCERIN  (NITROSTAT ) 0.4 MG SL tablet Place 1 tablet (0.4 mg total) under the tongue every 5 (five) minutes as needed for chest pain.   zinc  gluconate 50 MG tablet Take 1 tablet (50 mg total) by mouth daily.   [DISCONTINUED] cetirizine  (ZYRTEC ) 10 MG tablet Take 1 tablet (10 mg total) by mouth at bedtime.   [DISCONTINUED] FEROSUL 325 (65 Fe) MG tablet Take 1 tablet by mouth once daily with breakfast   [DISCONTINUED] furosemide  (LASIX ) 20 MG tablet Take 1 tablet (20 mg total) by mouth daily.   [  DISCONTINUED] hydrochlorothiazide  (HYDRODIURIL ) 25 MG tablet Take 1 tablet (25 mg total) by mouth daily.   cetirizine  (ZYRTEC ) 10 MG tablet Take 1 tablet (10 mg total) by mouth at bedtime.   ferrous sulfate  (FEROSUL) 325 (65 FE) MG tablet Take 1 tablet (325 mg total) by mouth daily with breakfast.   furosemide  (LASIX ) 20 MG tablet Take 1 tablet (20 mg total) by mouth daily.   hydrochlorothiazide  (HYDRODIURIL ) 25 MG tablet Take 1 tablet (25 mg total) by mouth daily.   [DISCONTINUED] hydrochlorothiazide  (HYDRODIURIL ) 25 MG tablet Take 1 tablet (25 mg total) by mouth daily.    [EXPIRED] cefTRIAXone  (ROCEPHIN ) injection 1 g    [EXPIRED] methylPREDNISolone  sodium succinate (SOLU-MEDROL ) 125 mg/2 mL injection 125 mg    No facility-administered encounter medications on file as of 06/28/2024.    Review of Systems  Review of Systems  Constitutional: Negative.   HENT: Negative.    Cardiovascular:  Positive for leg swelling.  Gastrointestinal: Negative.   Allergic/Immunologic: Negative.   Neurological: Negative.   Psychiatric/Behavioral: Negative.       Objective:   BP (!) 143/99   Pulse 96   Wt (!) 343 lb (155.6 kg)   SpO2 98%   BMI 49.22 kg/m   Wt Readings from Last 5 Encounters:  06/28/24 (!) 343 lb (155.6 kg)  05/18/24 (!) 354 lb 3.2 oz (160.7 kg)  03/16/24 (!) 350 lb 6.4 oz (158.9 kg)  02/06/24 (!) 346 lb (156.9 kg)  02/01/24 (!) 350 lb 8.5 oz (159 kg)     Physical Exam Vitals and nursing note reviewed.  Constitutional:      General: He is not in acute distress.    Appearance: He is well-developed.  Cardiovascular:     Rate and Rhythm: Normal rate and regular rhythm.  Pulmonary:     Effort: Pulmonary effort is normal.     Breath sounds: Normal breath sounds.  Musculoskeletal:     Right lower leg: Swelling and tenderness present.     Right ankle: Swelling present. Tenderness present. Decreased range of motion.  Skin:    General: Skin is warm and dry.  Neurological:     Mental Status: He is alert and oriented to person, place, and time.       Assessment & Plan:   Lipid screening -     Lipid panel  Chronic anticoagulation  History of DVT (deep vein thrombosis)  Allergic rhinitis, unspecified seasonality, unspecified trigger -     Cetirizine  HCl; Take 1 tablet (10 mg total) by mouth at bedtime.  Dispense: 30 tablet; Refill: 11  Localized edema -     Furosemide ; Take 1 tablet (20 mg total) by mouth daily.  Dispense: 60 tablet; Refill: 1  Essential hypertension -     Hemoglobin A1c -     CBC -     Comprehensive metabolic  panel with GFR -     hydroCHLOROthiazide ; Take 1 tablet (25 mg total) by mouth daily.  Dispense: 90 tablet; Refill: 1  Cellulitis of right leg -     methylPREDNISolone  Sodium Succ -     cefTRIAXone  Sodium  Other orders -     Ferrous Sulfate ; Take 1 tablet (325 mg total) by mouth daily with breakfast.  Dispense: 90 tablet; Refill: 0     No follow-ups on file.   Bascom GORMAN Borer, NP 07/02/2024

## 2024-06-29 ENCOUNTER — Ambulatory Visit: Payer: Self-pay | Admitting: Nurse Practitioner

## 2024-06-29 LAB — CBC
Hematocrit: 49 % (ref 37.5–51.0)
Hemoglobin: 16 g/dL (ref 13.0–17.7)
MCH: 29.9 pg (ref 26.6–33.0)
MCHC: 32.7 g/dL (ref 31.5–35.7)
MCV: 92 fL (ref 79–97)
Platelets: 240 x10E3/uL (ref 150–450)
RBC: 5.35 x10E6/uL (ref 4.14–5.80)
RDW: 12.3 % (ref 11.6–15.4)
WBC: 12.5 x10E3/uL — ABNORMAL HIGH (ref 3.4–10.8)

## 2024-06-29 LAB — COMPREHENSIVE METABOLIC PANEL WITH GFR
ALT: 70 IU/L — ABNORMAL HIGH (ref 0–44)
AST: 52 IU/L — ABNORMAL HIGH (ref 0–40)
Albumin: 3.9 g/dL (ref 3.9–4.9)
Alkaline Phosphatase: 71 IU/L (ref 47–123)
BUN/Creatinine Ratio: 14 (ref 10–24)
BUN: 13 mg/dL (ref 8–27)
Bilirubin Total: 0.4 mg/dL (ref 0.0–1.2)
CO2: 21 mmol/L (ref 20–29)
Calcium: 9.4 mg/dL (ref 8.6–10.2)
Chloride: 98 mmol/L (ref 96–106)
Creatinine, Ser: 0.9 mg/dL (ref 0.76–1.27)
Globulin, Total: 2.6 g/dL (ref 1.5–4.5)
Glucose: 279 mg/dL — ABNORMAL HIGH (ref 70–99)
Potassium: 3.8 mmol/L (ref 3.5–5.2)
Sodium: 137 mmol/L (ref 134–144)
Total Protein: 6.5 g/dL (ref 6.0–8.5)
eGFR: 95 mL/min/1.73 (ref >59)

## 2024-06-29 LAB — LIPID PANEL
Chol/HDL Ratio: 3.6 ratio (ref 0.0–5.0)
Cholesterol, Total: 131 mg/dL (ref 100–199)
HDL: 36 mg/dL — ABNORMAL LOW (ref >39)
LDL Chol Calc (NIH): 64 mg/dL (ref 0–99)
Triglycerides: 185 mg/dL — ABNORMAL HIGH (ref 0–149)
VLDL Cholesterol Cal: 31 mg/dL (ref 5–40)

## 2024-06-29 LAB — HEMOGLOBIN A1C
Est. average glucose Bld gHb Est-mCnc: 235 mg/dL
Hgb A1c MFr Bld: 9.8 % — ABNORMAL HIGH (ref 4.8–5.6)

## 2024-06-29 MED ORDER — METFORMIN HCL ER 500 MG PO TB24: 500.0000 mg | ORAL_TABLET | Freq: Two times a day (BID) | ORAL | 2 refills | Status: AC

## 2024-07-05 ENCOUNTER — Ambulatory Visit: Payer: Self-pay | Admitting: Nurse Practitioner

## 2024-07-06 ENCOUNTER — Telehealth: Payer: Self-pay

## 2024-07-06 NOTE — Telephone Encounter (Signed)
 Copied from CRM #[id number]. Topic: Clinical - Medication Question >> Jul 06, 2024 10:43 AM Willma SAUNDERS wrote: Reason for CRM: Patient would to inquire if his insurance requires weight loss medication. If it does is requesting to see if NP Oley can prescribe him something.   Patient can be reached at [phone]

## 2024-07-10 NOTE — Telephone Encounter (Signed)
 Pt was called to advise he will have to reach out to the insurance company to find out if there is coverage. If so we will need the name of the med and we will send it to the provide for further instructions. KH

## 2024-07-12 ENCOUNTER — Telehealth: Payer: Self-pay | Admitting: Nurse Practitioner

## 2024-07-12 NOTE — Telephone Encounter (Signed)
 Copied from CRM #[id number]. Topic: Clinical - Medication Question >> Jul 06, 2024 10:43 AM Clayton Pena wrote: Reason for CRM: Patient would to inquire if his insurance requires weight loss medication. If it does is requesting to see if NP Oley can prescribe him something.   Patient can be reached at [phone]

## 2024-07-13 NOTE — Telephone Encounter (Signed)
Done kh

## 2024-07-24 ENCOUNTER — Telehealth: Payer: Self-pay | Admitting: *Deleted

## 2024-08-08 ENCOUNTER — Other Ambulatory Visit: Payer: Self-pay | Admitting: *Deleted

## 2024-08-08 ENCOUNTER — Other Ambulatory Visit: Payer: Self-pay

## 2024-08-08 NOTE — Patient Instructions (Signed)
 Visit Information  Thank you for taking time to visit with me today. Please don't hesitate to contact me if I can be of assistance to you before our next scheduled appointment.  Your next care management appointment is by telephone on 09/04/24 at 3 pm.  Telephone follow-up in 1 month: 09/04/24 @ 3 pm.   Please call the care guide team at [phone] if you need to cancel, schedule, or reschedule an appointment.   Please call the Suicide and Crisis Lifeline: 988 call the [place]  National Suicide Prevention Lifeline: 253-280-8115 or TTY: 814 128 5601 TTY 4322373404) to talk to a trained counselor call 1-800-273-TALK (toll free, 24 hour hotline) go to [facility] [address] [place] [phone]) call the [facility] Crisis Line: [phone] call 911 if you are experiencing a Mental Health or Behavioral Health Crisis or need someone to talk to.  Shaune Westfall, RN, BSN, Theatre Manager [employer] [phone]

## 2024-08-08 NOTE — Patient Outreach (Signed)
 Complex Care Management   Visit Note  08/08/2024  Name:  Clayton Pena MRN: [id number] DOB: 1960-12-20  Situation: Referral received for Complex Care Management related to HTN I obtained verbal consent from Patient.  Visit completed with Patient  on the phone  Background:   Past Medical History:  Diagnosis Date   Abdominal distension 12/20/2023   Acute suppr otitis media w/o spon rupt ear drum, right ear 05/2019   Angina at rest    Cellulitis    Coronary artery disease    DVT (deep venous thrombosis) (HCC)    right leg   Hypertension    MI (myocardial infarction) (HCC)    2006, 2007, 2011    Assessment: Patient Reported Symptoms:  Cognitive Cognitive Status: Able to follow simple commands, Alert and oriented to person, place, and time, Insightful and able to interpret abstract concepts, Normal speech and language skills Cognitive/Intellectual Conditions Management [RPT]: None reported or documented in medical history or problem list   Health Maintenance Behaviors: Annual physical exam, Stress management, Social activities, Sleep adequate, Healthy diet Healing Pattern: Average Health Facilitated by: Healthy diet, Prayer/meditation, Rest, Stress management  Neurological   Neurological Management Strategies: Adequate rest, Routine screening Neurological Self-Management Outcome: 4 (good)  HEENT HEENT Symptoms Reported: No symptoms reported HEENT Management Strategies: Adequate rest, Routine screening HEENT Self-Management Outcome: 4 (good)    Cardiovascular Cardiovascular Symptoms Reported: No symptoms reported Does patient have uncontrolled Hypertension?: No Cardiovascular Management Strategies: Adequate rest, Routine screening Cardiovascular Self-Management Outcome: 3 (uncertain) Cardiovascular Comment: Patient has no blood pressure readings.  Reports that his FSA will pay for a blood pressure cuff after 08/29/24 and other DME if needed.  Respiratory Respiratory Symptoms  Reported: Productive cough Other Respiratory Symptoms: Patient reports that he had the flu recently.  He informs me that he has a lingering cough that is productive and he is coughing up phlem.  Reports no shortness of breath. Respiratory Management Strategies: Adequate rest, Routine screening Respiratory Self-Management Outcome: 4 (good)  Endocrine Endocrine Symptoms Reported: Other Other symptoms related to hypoglycemia or hyperglycemia: Noted recent A1C 9.8.  Patient reports that he was started on Metformin .  He informs me that he does not take the Metformin  due to the way it makes him feel.  He informs me that he is watching what he eats.  He reports that he feels like he may have Parasite that could be causing increase blood sugar.  He reports that [place]  He reports that he is going to watch what he eats and have blood sugar checked in 3 months. Is patient diabetic?:  (Recent A1C 9.8.) Endocrine Self-Management Outcome: 3 (uncertain)  Gastrointestinal Gastrointestinal Symptoms Reported: No symptoms reported Gastrointestinal Management Strategies: Adequate rest Gastrointestinal Self-Management Outcome: 4 (good)    Genitourinary Genitourinary Symptoms Reported: No symptoms reported Genitourinary Management Strategies: Adequate rest Genitourinary Self-Management Outcome: 4 (good)  Integumentary Integumentary Symptoms Reported: No symptoms reported Skin Management Strategies: Routine screening Skin Self-Management Outcome: 4 (good)  Musculoskeletal Musculoskelatal Symptoms Reviewed: Joint pain Additional Musculoskeletal Details: Patient reports that he has occassional joint pain.  He reports that he has received injections for the join pain.  Patient also reports that he was having pain in his ankles.  Patient informs me that he received injections in his ankles and his pain is  minimal at this time. Musculoskeletal Management Strategies: Adequate  rest, Medication therapy, Routine screening, Medical device, Coping strategies Musculoskeletal Self-Management Outcome: 3 (uncertain)  Falls in the past year?: Yes (Patient reports that he fell due to water  being on the floor) Number of falls in past year: 2 or more Was there an injury with Fall?: Yes (Bruising) Fall Risk Category Calculator: 3 Patient Fall Risk Level: High Fall Risk Patient at Risk for Falls Due to: History of fall(s), Orthopedic patient Fall risk Follow up: Falls evaluation completed, Education provided, Falls prevention discussed  Psychosocial Psychosocial Symptoms Reported: Sadness - if selected complete PHQ 2-9 Additional Psychological Details: Patient reports recent divorce and sadness due to the holidays.  Patient denies the need for LCSW. Behavioral Management Strategies: Adequate rest, Coping strategies Behavioral Health Self-Management Outcome: 3 (uncertain) Major Change/Loss/Stressor/Fears (CP): Separation or divorce Behaviors When Feeling Stressed/Fearful: Patient reports that he will read and play music to help him cope with recent divorce Techniques to Cope with Loss/Stress/Change: Diversional activities Do you feel physically threatened by others?: No    08/08/2024    PHQ2-9 Depression Screening   Little interest or pleasure in doing things Not at all  Feeling down, depressed, or hopeless Not at all  PHQ-2 - Total Score 0  Trouble falling or staying asleep, or sleeping too much    Feeling tired or having little energy    Poor appetite or overeating     Feeling bad about yourself - or that you are a failure or have let yourself or your family down    Trouble concentrating on things, such as reading the newspaper or watching television    Moving or speaking so slowly that other people could have noticed.  Or the opposite - being so fidgety or restless that you have been moving around a lot more than usual    Thoughts that you would be better off dead, or  hurting yourself in some way    PHQ2-9 Total Score    If you checked off any problems, how difficult have these problems made it for you to do your work, take care of things at home, or get along with other people    Depression Interventions/Treatment      There were no vitals filed for this visit. Pain Scale: 0-10 Patients Stated Pain Goal: 0  Medications Reviewed Today     Reviewed by Jorja Nichole LABOR, RN (Case Manager) on 08/08/24 at 1340  Med List Status: <None>   Medication Order Taking? Sig Documenting Provider Last Dose Status Informant  acetaminophen -codeine  (TYLENOL  #3) 300-30 MG tablet [id number] Yes Take 1 tablet by mouth every 6 (six) hours as needed for moderate pain (pain score 4-6). Oley Bascom RAMAN, NP  Active   albuterol  (VENTOLIN  HFA) 108 [phone] Base) MCG/ACT inhaler [id number] Yes Inhale 2 puffs into the lungs every 6 (six) hours as needed for wheezing or shortness of breath. Oley Bascom RAMAN, NP  Active            Med Note SAMULE IHA   Wed May 04, 2023 11:38 AM) prn  apixaban  (ELIQUIS ) 5 MG TABS tablet [id number] Yes Take 1 tablet (5 mg total) by mouth 2 (two) times daily. Oley Bascom RAMAN, NP  Active   APPLE CIDER VINEGAR PO [id number] Yes Take by mouth. [provider]  Active   Ascorbic Acid (VITAMIN C PO) [id number] Yes Take by mouth. [provider]  Active   cetirizine  (ZYRTEC ) 10 MG tablet [id number] Yes Take 1 tablet (10 mg total) by mouth at bedtime. Nichols, Tonya S, NP  Active   chlorhexidine  (PERIDEX ) 0.12 % solution [id number] Yes  Use as directed 15 mLs in the mouth or throat 2 (two) times daily. Gladis Elsie BROCKS, PA-C  Active   ferrous sulfate  (FEROSUL) 325 (65 FE) MG tablet [id number] Yes Take 1 tablet (325 mg total) by mouth daily with breakfast. Nichols, Tonya S, NP  Active   furosemide  (LASIX ) 20 MG tablet [id number] Yes Take 1 tablet (20 mg total) by mouth daily. Oley Bascom RAMAN, NP  Active   hydrochlorothiazide  (HYDRODIURIL ) 25 MG tablet  [id number] Yes Take 1 tablet (25 mg total) by mouth daily. Oley Bascom RAMAN, NP  Active   MAGNESIUM PO [id number] Yes Take by mouth. [provider]  Active   metFORMIN  (GLUCOPHAGE -XR) 500 MG 24 hr tablet [id number]  Take 1 tablet (500 mg total) by mouth 2 (two) times daily with a meal.  Patient not taking: Reported on 08/08/2024   Oley Bascom RAMAN, NP  Active   nitroGLYCERIN  (NITROSTAT ) 0.4 MG SL tablet [id number] Yes Place 1 tablet (0.4 mg total) under the tongue every 5 (five) minutes as needed for chest pain. Oley Bascom RAMAN, NP  Active   zinc  gluconate 50 MG tablet [id number] Yes Take 1 tablet (50 mg total) by mouth daily. Vicenta Maduro, FNP  Active Self            Recommendation:   PCP Follow-up Specialty provider follow-up :Oncology-08/28/24 Continue Current Plan of Care  Follow Up Plan:   Telephone follow-up in 1 month: 09/04/24 @ 3 pm.   Abenezer Odonell, RN, BSN, ACM RN Care Manager [facility] [phone]

## 2024-08-28 ENCOUNTER — Inpatient Hospital Stay: Payer: Medicaid Other | Admitting: Physician Assistant

## 2024-08-28 ENCOUNTER — Ambulatory Visit: Payer: Self-pay

## 2024-08-28 ENCOUNTER — Telehealth: Payer: Self-pay

## 2024-08-28 ENCOUNTER — Inpatient Hospital Stay: Payer: Medicaid Other

## 2024-08-29 NOTE — Telephone Encounter (Signed)
 Pt has been contacted and advised on appointment for 08/30/24 at 11 am. He voiced his understanding and states that he will be here. CB.

## 2024-08-29 NOTE — Telephone Encounter (Signed)
 Pt will find a DME clinic that can help with ordering the bathroom rails. KH

## 2024-08-30 ENCOUNTER — Other Ambulatory Visit (HOSPITAL_COMMUNITY): Payer: Self-pay

## 2024-08-30 ENCOUNTER — Ambulatory Visit: Admitting: Nurse Practitioner

## 2024-08-30 ENCOUNTER — Encounter: Payer: Self-pay | Admitting: Nurse Practitioner

## 2024-08-30 VITALS — BP 123/85 | HR 81 | Temp 98.0°F | Wt 333.0 lb

## 2024-08-30 DIAGNOSIS — Z8619 Personal history of other infectious and parasitic diseases: Secondary | ICD-10-CM | POA: Diagnosis not present

## 2024-08-30 DIAGNOSIS — R432 Parageusia: Secondary | ICD-10-CM

## 2024-08-30 DIAGNOSIS — E119 Type 2 diabetes mellitus without complications: Secondary | ICD-10-CM

## 2024-08-30 DIAGNOSIS — G4700 Insomnia, unspecified: Secondary | ICD-10-CM

## 2024-08-30 DIAGNOSIS — R61 Generalized hyperhidrosis: Secondary | ICD-10-CM | POA: Diagnosis not present

## 2024-08-30 DIAGNOSIS — Z7409 Other reduced mobility: Secondary | ICD-10-CM | POA: Diagnosis not present

## 2024-08-30 DIAGNOSIS — L03119 Cellulitis of unspecified part of limb: Secondary | ICD-10-CM

## 2024-08-30 DIAGNOSIS — R600 Localized edema: Secondary | ICD-10-CM

## 2024-08-30 MED ORDER — WALL GRAB BAR MISC: 1.0000 | 0 refills | Status: AC | PRN

## 2024-08-30 MED ORDER — TRAZODONE HCL 50 MG PO TABS: 25.0000 mg | ORAL_TABLET | Freq: Every evening | ORAL | 3 refills | Status: AC | PRN

## 2024-08-30 MED ORDER — OZEMPIC (0.25 OR 0.5 MG/DOSE) 2 MG/3ML ~~LOC~~ SOPN: 0.2500 mg | PEN_INJECTOR | SUBCUTANEOUS | 2 refills | Status: AC

## 2024-08-30 MED ORDER — CEFTRIAXONE SODIUM 1 G IJ SOLR
1.0000 g | Freq: Once | INTRAMUSCULAR | Status: AC
  Administered 2024-08-30: 1 g via INTRAMUSCULAR

## 2024-08-30 MED ORDER — METHYLPREDNISOLONE SODIUM SUCC 40 MG IJ SOLR
40.0000 mg | Freq: Once | INTRAMUSCULAR | Status: AC
  Administered 2024-08-30: 40 mg via INTRAMUSCULAR

## 2024-08-30 MED ORDER — PREDNISONE 20 MG PO TABS: 20.0000 mg | ORAL_TABLET | Freq: Every day | ORAL | 5 refills | Status: DC

## 2024-08-30 NOTE — Progress Notes (Signed)
 "  Subjective   Patient ID: Clayton Pena, male    DOB: [birth year], 64 y.o.   MRN: [id number]  Chief Complaint  Patient presents with   Follow-up    Had flu before christmas now have no taste and suffering with night sweat.     Referring provider: Oley Bascom RAMAN, NP  Clayton Pena is a 64 y.o. male with Past Medical History: 12/20/2023: Abdominal distension 05/2019: Acute suppr otitis media w/o spon rupt ear drum, right ear No date: Angina at rest No date: Cellulitis No date: Coronary artery disease No date: DVT (deep venous thrombosis) (HCC)     Comment:  right leg No date: Hypertension No date: MI (myocardial infarction) (HCC)     Comment:  2006, 2007, 2011   HPI  Patient presents today for an acute visit.  He states that he did have the flu and has lost his taste and smell since that time.  We discussed that this is common after a viral illness.  Hopefully this will return within 6 to 8 weeks but could take up to 6 months.  Patient does have ongoing issues with bilateral lower extremity edema and cellulitis.  We will give injections in office today.  Patient is concerned that he could have intestinal parasites.  [place]  He has had parasitic infections in the past.  We will place a referral for him today to GI for further evaluation and treatment.  He has been having night sweats and is worried that this may be due to the parasitic infection.  His A1c at last visit was 9.2.  He was started on metformin  but did not take the medication.  We will try to order Ozempic  today.  We will have pharmacy follow-up with medication management. Denies f/c/s, n/v/d, hemoptysis, PND, leg swelling Denies chest pain or edema     Allergies[1]  Immunization History  Administered Date(s) Administered   Influenza,inj,Quad PF,6+ Mos 06/15/2016, 05/25/2017, 05/16/2019   Tdap 02/22/2018    Tobacco History: Tobacco Use History[2] Counseling given: Not  Answered   Outpatient Encounter Medications as of 08/30/2024  Medication Sig   acetaminophen -codeine  (TYLENOL  #3) 300-30 MG tablet Take 1 tablet by mouth every 6 (six) hours as needed for moderate pain (pain score 4-6).   albuterol  (VENTOLIN  HFA) 108 (90 Base) MCG/ACT inhaler Inhale 2 puffs into the lungs every 6 (six) hours as needed for wheezing or shortness of breath.   APPLE CIDER VINEGAR PO Take by mouth.   Ascorbic Acid (VITAMIN C PO) Take by mouth.   cetirizine  (ZYRTEC ) 10 MG tablet Take 1 tablet (10 mg total) by mouth at bedtime.   chlorhexidine  (PERIDEX ) 0.12 % solution Use as directed 15 mLs in the mouth or throat 2 (two) times daily.   ferrous sulfate  (FEROSUL) 325 (65 FE) MG tablet Take 1 tablet (325 mg total) by mouth daily with breakfast.   furosemide  (LASIX ) 20 MG tablet Take 1 tablet (20 mg total) by mouth daily.   hydrochlorothiazide  (HYDRODIURIL ) 25 MG tablet Take 1 tablet (25 mg total) by mouth daily.   MAGNESIUM PO Take by mouth.   Misc. Devices (WALL GRAB BAR) MISC 1 each by Does not apply route as needed. For balance and gait   nitroGLYCERIN  (NITROSTAT ) 0.4 MG SL tablet Place 1 tablet (0.4 mg total) under the tongue every 5 (five) minutes as needed for chest pain.   Semaglutide ,0.25 or 0.5MG /DOS, (OZEMPIC , 0.25 OR 0.5 MG/DOSE,) 2 MG/3ML  SOPN Inject 0.25 mg into the skin once a week.   traZODone  (DESYREL ) 50 MG tablet Take 0.5-1 tablets (25-50 mg total) by mouth at bedtime as needed for sleep.   zinc  gluconate 50 MG tablet Take 1 tablet (50 mg total) by mouth daily.   [DISCONTINUED] predniSONE  (DELTASONE ) 20 MG tablet Take 1 tablet (20 mg total) by mouth daily with breakfast.   apixaban  (ELIQUIS ) 5 MG TABS tablet Take 1 tablet (5 mg total) by mouth 2 (two) times daily. (Patient not taking: Reported on 08/30/2024)   metFORMIN  (GLUCOPHAGE -XR) 500 MG 24 hr tablet Take 1 tablet (500 mg total) by mouth 2 (two) times daily with a meal. (Patient not taking: Reported on 08/30/2024)    [EXPIRED] cefTRIAXone  (ROCEPHIN ) injection 1 g    [EXPIRED] methylPREDNISolone  sodium succinate (SOLU-MEDROL ) 40 mg/mL injection 40 mg    No facility-administered encounter medications on file as of 08/30/2024.    Review of Systems  Review of Systems  Constitutional: Negative.   HENT: Negative.    Cardiovascular: Negative.   Gastrointestinal: Negative.   Allergic/Immunologic: Negative.   Neurological: Negative.   Psychiatric/Behavioral: Negative.       Objective:   BP 123/85   Pulse 81   Temp 98 F (36.7 C) (Temporal)   Wt (!) 333 lb (151 kg)   SpO2 97%   BMI 47.78 kg/m   Wt Readings from Last 5 Encounters:  08/30/24 (!) 333 lb (151 kg)  06/28/24 (!) 343 lb (155.6 kg)  05/18/24 (!) 354 lb 3.2 oz (160.7 kg)  03/16/24 (!) 350 lb 6.4 oz (158.9 kg)  02/06/24 (!) 346 lb (156.9 kg)     Physical Exam Vitals and nursing note reviewed.  Constitutional:      General: He is not in acute distress.    Appearance: He is well-developed.  Cardiovascular:     Rate and Rhythm: Normal rate and regular rhythm.  Pulmonary:     Effort: Pulmonary effort is normal.     Breath sounds: Normal breath sounds.  Skin:    General: Skin is warm and dry.  Neurological:     Mental Status: He is alert and oriented to person, place, and time.       Assessment & Plan:   History of intestinal parasite -     Ambulatory referral to Gastroenterology  Type 2 diabetes mellitus without complication, without long-term current use of insulin (HCC) -     AMB Referral [facility] Management -     CBC -     Comprehensive metabolic panel with GFR -     Ozempic  (0.25 or 0.5 MG/DOSE); Inject 0.25 mg into the skin once a week.  Dispense: 3 mL; Refill: 2  Loss of taste  Insomnia, unspecified type -     traZODone  HCl; Take 0.5-1 tablets (25-50 mg total) by mouth at bedtime as needed for sleep.  Dispense: 30 tablet; Refill: 3  Night sweats -     Amylase -     Lipase  Bilateral lower extremity  edema -     cefTRIAXone  Sodium -     methylPREDNISolone  Sodium Succ  Cellulitis of lower extremity, unspecified laterality -     cefTRIAXone  Sodium -     methylPREDNISolone  Sodium Succ  Impaired functional mobility, balance, gait, and endurance -     Wall Grab Bar; 1 each by Does not apply route as needed. For balance and gait  Dispense: 2 each; Refill: 0     Return in about  3 months (around 11/27/2024).     Bascom GORMAN Borer, NP 08/30/2024     [1]  Allergies Allergen Reactions   Penicillins Anaphylaxis    Has patient had a PCN reaction causing immediate rash, facial/tongue/throat swelling, SOB or lightheadedness with hypotension: unknown Has patient had a PCN reaction causing severe rash involving mucus membranes or skin necrosis: unknown Has patient had a PCN reaction that required hospitalization: unknown Has patient had a PCN reaction occurring within the last 10 years: no If all of the above answers are NO, then may proceed with Cephalosporin use.    Demerol  [Meperidine Hcl] Other (See Comments)    REACTION:  Increases blood pressure   Demerol [Meperidine] Other (See Comments)    REACTION:  Increases blood pressure  [2]  Social History Tobacco Use  Smoking Status Never  Smokeless Tobacco Never   "

## 2024-08-31 LAB — COMPREHENSIVE METABOLIC PANEL WITH GFR
ALT: 96 [IU]/L — ABNORMAL HIGH (ref 0–44)
AST: 68 [IU]/L — ABNORMAL HIGH (ref 0–40)
Albumin: 3.9 g/dL (ref 3.9–4.9)
Alkaline Phosphatase: 72 [IU]/L (ref 47–123)
BUN/Creatinine Ratio: 14 (ref 10–24)
BUN: 13 mg/dL (ref 8–27)
Bilirubin Total: 0.8 mg/dL (ref 0.0–1.2)
CO2: 20 mmol/L (ref 20–29)
Calcium: 9.3 mg/dL (ref 8.6–10.2)
Chloride: 97 mmol/L (ref 96–106)
Creatinine, Ser: 0.95 mg/dL (ref 0.76–1.27)
Globulin, Total: 2.9 g/dL (ref 1.5–4.5)
Glucose: 384 mg/dL — ABNORMAL HIGH (ref 70–99)
Potassium: 4.1 mmol/L (ref 3.5–5.2)
Sodium: 135 mmol/L (ref 134–144)
Total Protein: 6.8 g/dL (ref 6.0–8.5)
eGFR: 89 mL/min/{1.73_m2}

## 2024-08-31 LAB — CBC
Hematocrit: 51.4 % — ABNORMAL HIGH (ref 37.5–51.0)
Hemoglobin: 16.5 g/dL (ref 13.0–17.7)
MCH: 30.2 pg (ref 26.6–33.0)
MCHC: 32.1 g/dL (ref 31.5–35.7)
MCV: 94 fL (ref 79–97)
Platelets: 244 10*3/uL (ref 150–450)
RBC: 5.47 x10E6/uL (ref 4.14–5.80)
RDW: 12 % (ref 11.6–15.4)
WBC: 10.9 10*3/uL — ABNORMAL HIGH (ref 3.4–10.8)

## 2024-08-31 LAB — AMYLASE: Amylase: 28 U/L — ABNORMAL LOW (ref 31–110)

## 2024-08-31 LAB — LIPASE: Lipase: 40 U/L (ref 13–78)

## 2024-09-04 ENCOUNTER — Telehealth: Admitting: *Deleted

## 2024-11-26 ENCOUNTER — Ambulatory Visit: Payer: Self-pay | Admitting: Nurse Practitioner

## 2024-11-29 ENCOUNTER — Ambulatory Visit: Payer: Self-pay | Admitting: Nurse Practitioner

## 2025-02-26 ENCOUNTER — Other Ambulatory Visit: Payer: Medicaid Other

## 2025-02-26 ENCOUNTER — Ambulatory Visit: Payer: Medicaid Other | Admitting: Physician Assistant

## 2025-02-27 ENCOUNTER — Other Ambulatory Visit: Payer: Medicaid Other

## 2025-02-27 ENCOUNTER — Ambulatory Visit: Payer: Medicaid Other | Admitting: Physician Assistant
